# Patient Record
Sex: Female | Born: 1975 | Hispanic: No | Marital: Married | Smoking: Never smoker
Health system: Southern US, Community
[De-identification: ages and names within clinical notes are randomized; demographics above are authoritative.]

## PROBLEM LIST (undated history)

## (undated) ENCOUNTER — Inpatient Hospital Stay (HOSPITAL_COMMUNITY): Payer: Self-pay

## (undated) DIAGNOSIS — R000 Tachycardia, unspecified: Secondary | ICD-10-CM

## (undated) DIAGNOSIS — E7800 Pure hypercholesterolemia, unspecified: Secondary | ICD-10-CM

## (undated) DIAGNOSIS — N200 Calculus of kidney: Secondary | ICD-10-CM

## (undated) HISTORY — PX: TONSILLECTOMY: SUR1361

## (undated) HISTORY — PX: WISDOM TOOTH EXTRACTION: SHX21

## (undated) HISTORY — PX: CHOLECYSTECTOMY: SHX55

---

## 2006-09-20 ENCOUNTER — Inpatient Hospital Stay (HOSPITAL_COMMUNITY): Admission: AD | Admit: 2006-09-20 | Discharge: 2006-09-20 | Payer: Self-pay | Admitting: Gynecology

## 2006-09-20 ENCOUNTER — Encounter (INDEPENDENT_AMBULATORY_CARE_PROVIDER_SITE_OTHER): Payer: Self-pay | Admitting: *Deleted

## 2006-10-11 ENCOUNTER — Encounter: Admission: RE | Admit: 2006-10-11 | Discharge: 2006-10-11 | Payer: Self-pay | Admitting: Obstetrics and Gynecology

## 2007-06-06 ENCOUNTER — Encounter (INDEPENDENT_AMBULATORY_CARE_PROVIDER_SITE_OTHER): Payer: Self-pay | Admitting: Obstetrics and Gynecology

## 2007-06-06 ENCOUNTER — Ambulatory Visit (HOSPITAL_COMMUNITY): Admission: RE | Admit: 2007-06-06 | Discharge: 2007-06-06 | Payer: Self-pay | Admitting: Radiology

## 2007-12-30 IMAGING — US US OB COMP LESS 14 WK
1 series · 14 of 28 positions shown · non-contrast
Comparison: None.

CLINICAL DATA: Heavy vaginal bleeding and pelvic cramping for the past 5 days.
13 weeks and 4 days pregnant by last menstrual period. Quantitative beta-hCG of
625.

COMPLETE OBSTETRICAL ULTRASOUND LESS THAN 14 WEEKS AND TRANSVAGINAL OBSTETRICAL
ULTRASOUND

[Series 1: us ob comp less 14 wk · 0.25mm/px · 14 of 41 slices shown]
[im 2/41]
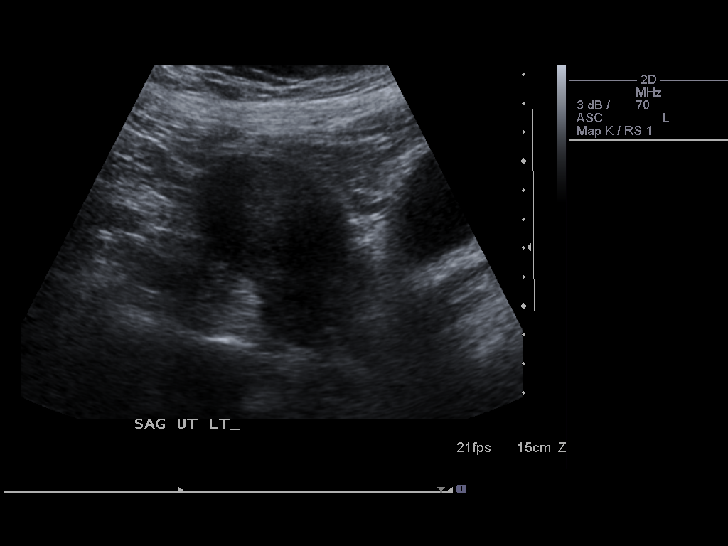
[im 5/41]
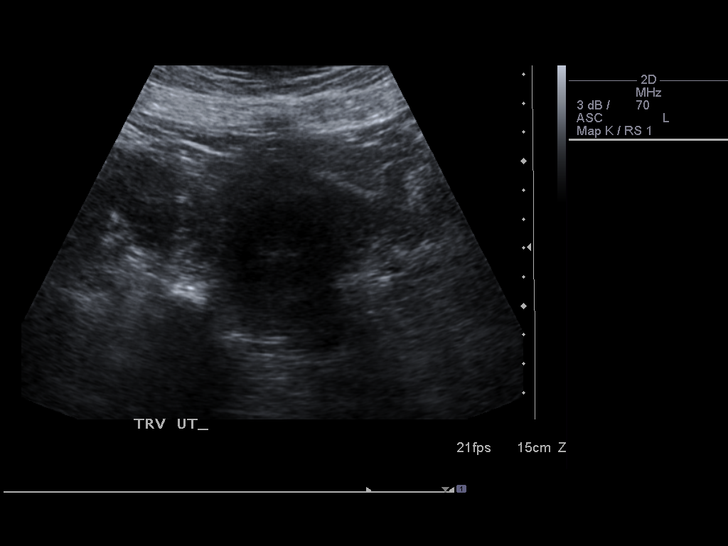
[im 8/41]
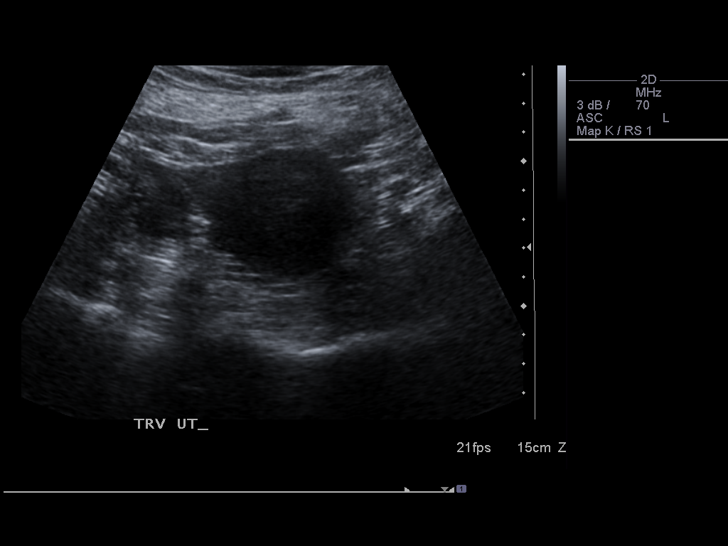
[im 11/41]
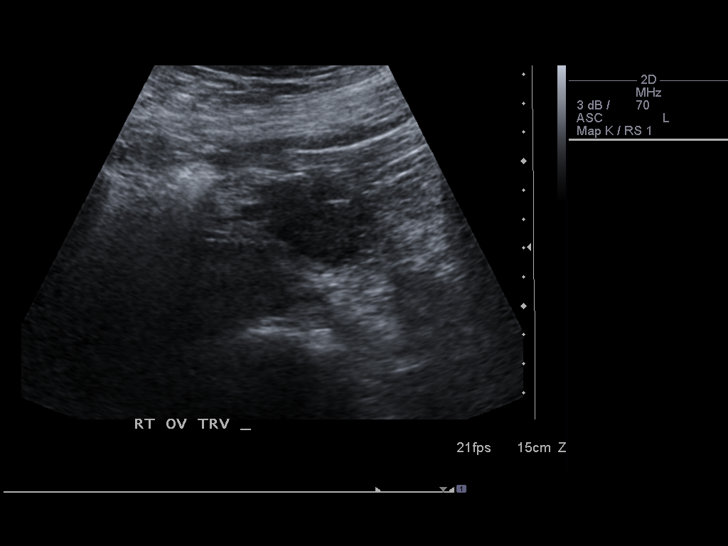
[im 14/41]
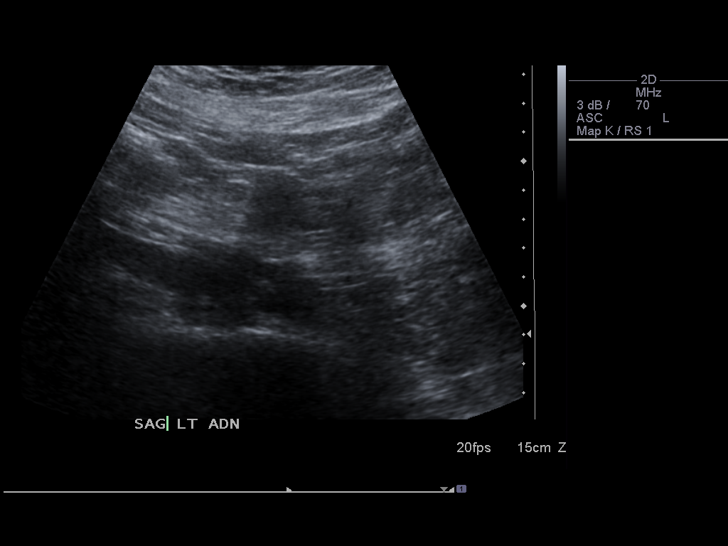
[im 17/41]
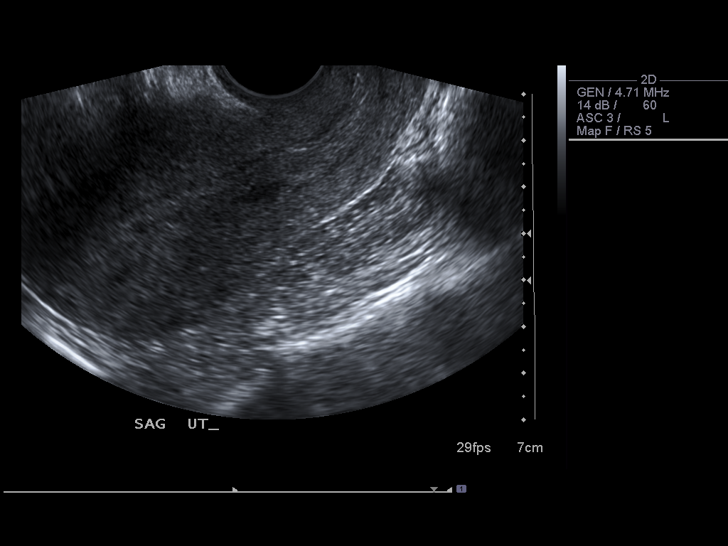
[im 20/41]
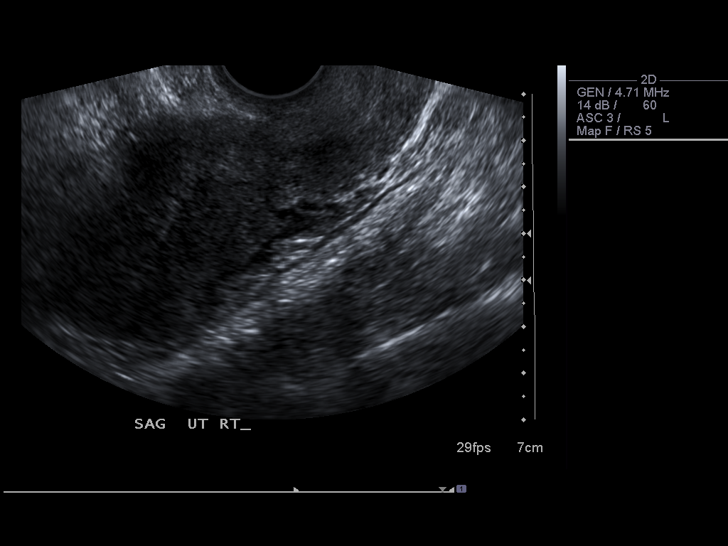
[im 23/41]
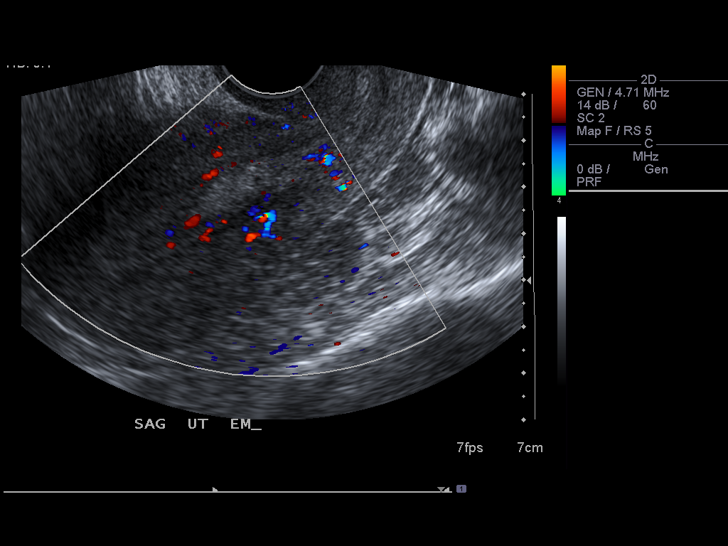
[im 26/41]
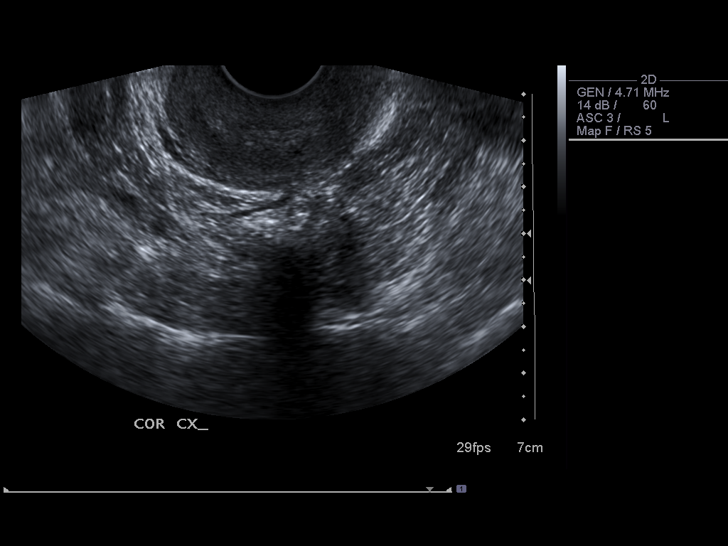
[im 29/41]
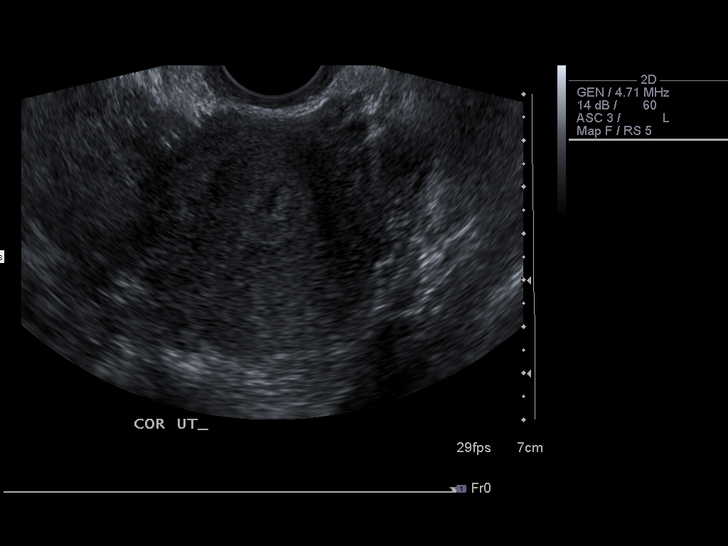
[im 32/41]
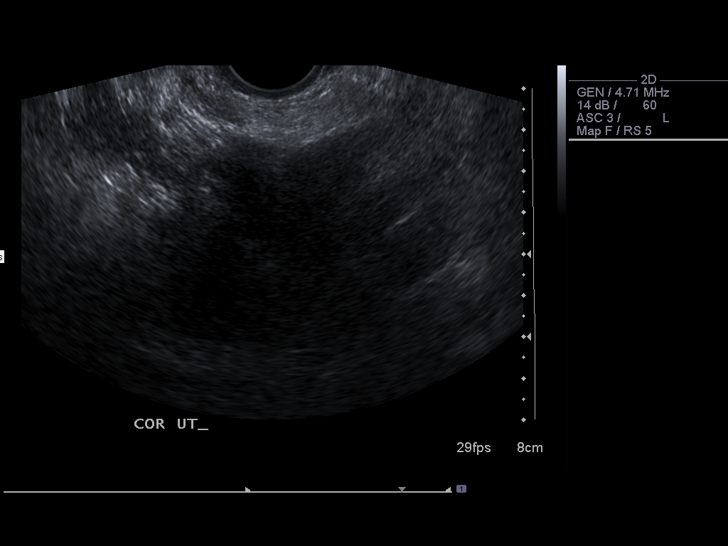
[im 35/41]
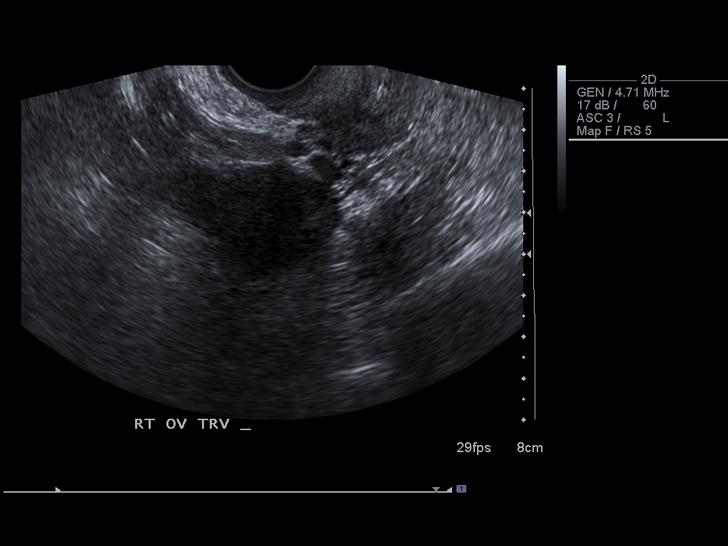
[im 38/41]
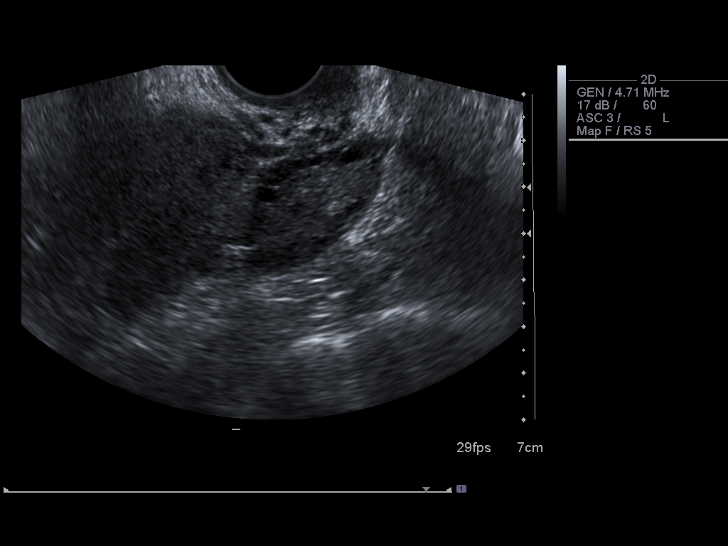
[im 41/41]
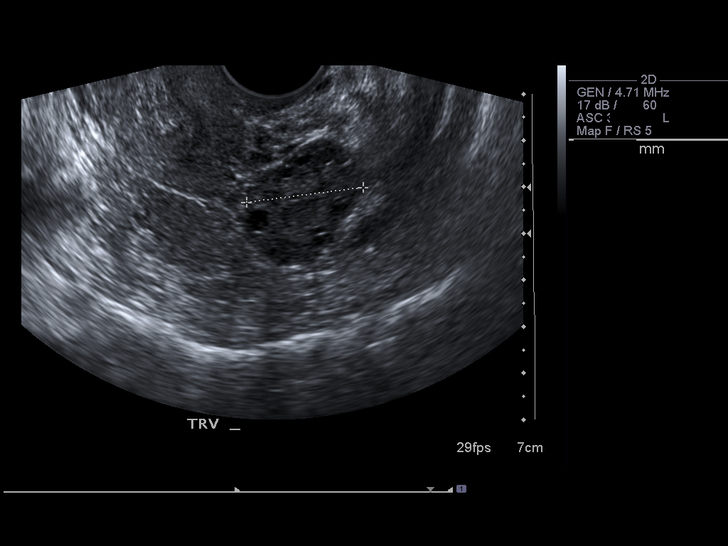

[14 of 28 positions shown; findings below may reference images not displayed]

FINDINGS: Transabdominal and transvaginal sonographic imaging of the pelvis
demonstrates a diffusely heterogeneous endometrium measuring 10.7 mm in maximum
thickness, transvaginally. There was swirling of material within the endometrium
at real time. Otherwise, normal appearing uterus. No intrauterine or
extrauterine gestational sac seen. Normal appearing ovaries. No free peritoneal
fluid.

IMPRESSION

Findings most compatible with spontaneous abortion with blood within the
endometrial cavity. The possibility of retained products of conception needs to
be excluded clinically. Serial quantitative beta-hCG determinations is
recommended to exclude a nonvisualized ectopic pregnancy.

## 2009-11-11 ENCOUNTER — Ambulatory Visit (HOSPITAL_COMMUNITY): Admission: RE | Admit: 2009-11-11 | Discharge: 2009-11-11 | Payer: Self-pay | Admitting: Obstetrics & Gynecology

## 2009-12-02 ENCOUNTER — Ambulatory Visit (HOSPITAL_COMMUNITY): Admission: RE | Admit: 2009-12-02 | Discharge: 2009-12-02 | Payer: Self-pay | Admitting: Obstetrics & Gynecology

## 2010-01-04 ENCOUNTER — Ambulatory Visit (HOSPITAL_COMMUNITY): Admission: RE | Admit: 2010-01-04 | Discharge: 2010-01-04 | Payer: Self-pay | Admitting: Obstetrics & Gynecology

## 2010-04-20 ENCOUNTER — Ambulatory Visit (HOSPITAL_COMMUNITY): Admission: RE | Admit: 2010-04-20 | Discharge: 2010-04-20 | Payer: Self-pay | Admitting: Obstetrics and Gynecology

## 2010-05-27 ENCOUNTER — Inpatient Hospital Stay (HOSPITAL_COMMUNITY): Admission: RE | Admit: 2010-05-27 | Discharge: 2010-05-29 | Payer: Self-pay | Admitting: Obstetrics and Gynecology

## 2010-12-10 ENCOUNTER — Emergency Department (HOSPITAL_COMMUNITY): Payer: BC Managed Care – PPO

## 2010-12-10 ENCOUNTER — Emergency Department (HOSPITAL_COMMUNITY)
Admission: EM | Admit: 2010-12-10 | Discharge: 2010-12-10 | Disposition: A | Discharge status: Home / Self Care | Payer: BC Managed Care – PPO | Attending: Emergency Medicine | Admitting: Emergency Medicine

## 2010-12-10 DIAGNOSIS — K8020 Calculus of gallbladder without cholecystitis without obstruction: Secondary | ICD-10-CM | POA: Insufficient documentation

## 2010-12-10 DIAGNOSIS — R1011 Right upper quadrant pain: Secondary | ICD-10-CM | POA: Insufficient documentation

## 2010-12-10 DIAGNOSIS — R110 Nausea: Secondary | ICD-10-CM | POA: Insufficient documentation

## 2010-12-10 DIAGNOSIS — R10811 Right upper quadrant abdominal tenderness: Secondary | ICD-10-CM | POA: Insufficient documentation

## 2010-12-10 LAB — COMPREHENSIVE METABOLIC PANEL
Albumin: 3.6 g/dL (ref 3.5–5.2)
BUN: 9 mg/dL (ref 6–23)
Chloride: 103 mEq/L (ref 96–112)
Creatinine, Ser: 0.67 mg/dL (ref 0.4–1.2)
GFR calc Af Amer: >60 mL/min (ref >60)
Glucose, Bld: 85 mg/dL (ref 70–99)
Potassium: 3.6 mEq/L (ref 3.5–5.1)

## 2010-12-10 LAB — LIPASE, BLOOD: Lipase: 22 U/L (ref 11–59)

## 2010-12-10 LAB — DIFFERENTIAL
Basophils Relative: 0 % (ref 0–1)
Eosinophils Absolute: 0.1 10*3/uL (ref 0.0–0.7)
Eosinophils Relative: 1 % (ref 0–5)
Lymphs Abs: 2.2 10*3/uL (ref 0.7–4.0)
Neutrophils Relative %: 60 % (ref 43–77)

## 2010-12-10 LAB — CBC
HCT: 41.7 % (ref 36.0–46.0)
Hemoglobin: 14.2 g/dL (ref 12.0–15.0)
MCHC: 34.1 g/dL (ref 30.0–36.0)
Platelets: 310 10*3/uL (ref 150–400)
WBC: 6.9 10*3/uL (ref 4.0–10.5)

## 2010-12-10 LAB — URINALYSIS, ROUTINE W REFLEX MICROSCOPIC
Nitrite: NEGATIVE
Specific Gravity, Urine: 1.03 (ref 1.005–1.030)
Urobilinogen, UA: 0.2 mg/dL (ref 0.0–1.0)

## 2010-12-10 LAB — URINE MICROSCOPIC-ADD ON

## 2010-12-10 LAB — POCT PREGNANCY, URINE: Preg Test, Ur: NEGATIVE

## 2010-12-17 ENCOUNTER — Ambulatory Visit (HOSPITAL_COMMUNITY)
Admission: RE | Admit: 2010-12-17 | Discharge: 2010-12-17 | Disposition: A | Discharge status: Home / Self Care | Payer: BC Managed Care – PPO | Source: Ambulatory Visit | Attending: General Surgery | Admitting: General Surgery

## 2010-12-17 ENCOUNTER — Other Ambulatory Visit: Payer: Self-pay | Admitting: General Surgery

## 2010-12-17 ENCOUNTER — Ambulatory Visit (HOSPITAL_COMMUNITY): Payer: BC Managed Care – PPO

## 2010-12-17 DIAGNOSIS — E669 Obesity, unspecified: Secondary | ICD-10-CM | POA: Insufficient documentation

## 2010-12-17 DIAGNOSIS — Z79899 Other long term (current) drug therapy: Secondary | ICD-10-CM | POA: Insufficient documentation

## 2010-12-17 DIAGNOSIS — K8010 Calculus of gallbladder with chronic cholecystitis without obstruction: Secondary | ICD-10-CM | POA: Insufficient documentation

## 2010-12-17 LAB — SURGICAL PCR SCREEN
MRSA, PCR: NEGATIVE
Staphylococcus aureus: NEGATIVE

## 2010-12-20 NOTE — Op Note (Signed)
NAMEEULAR, PANEK           ACCOUNT NO.:  [id number]  MEDICAL RECORD NO.:  [id number]           PATIENT TYPE:  O  LOCATION:  DAYL                         FACILITY:  [facility]  PHYSICIAN:  Ollen Gross. Vernell Morgans, M.D. DATE OF BIRTH:  1975/12/18  DATE OF PROCEDURE:  12/17/2010 DATE OF DISCHARGE:                              OPERATIVE REPORT   PREOPERATIVE DIAGNOSIS:  Gallstones.  POSTOPERATIVE DIAGNOSIS:  Gallstones.  PROCEDURE:  Laparoscopic cholecystectomy with intraoperative cholangiogram.  SURGEON:  Ollen Gross. Vernell Morgans, M.D.  ASSISTANT:  Eber Hong, P.A.  ANESTHESIA:  General endotracheal.  PROCEDURE IN DETAIL:  After informed consent was obtained, the patient was brought to the operating room and placed in the supine position on the operating room table.  After adequate induction of general anesthesia, the patient's abdomen was prepped with ChloraPrep, allowed to dry and then draped in the usual sterile manner.  The area below the umbilicus was infiltrated with 0.25% Marcaine.  A small incision was made with a 15-blade knife.  This incision was carried down through the subcutaneous tissue bluntly with a Kelly clamp and [employer] retractors until the linea alba was identified.  The linea alba was incised with a 15-blade knife.  Each side was grasped with Kocher clamps and elevated anteriorly.  The preperitoneal space was then probed bluntly with a hemostat until the peritoneum was opened and access was gained to the abdominal cavity.  A 0-Vicryl pursestring stitch was placed in the fascia around the opening and a Hasson cannula was placed through the opening and anchored in place with the previously placed 0-Vicryl pursestring stitch.  The abdomen was then insufflated with carbon dioxide without difficulty.  A laparoscope was inserted through the Hasson cannula and the right upper quadrant was inspected.  The dome of the gallbladder and liver were readily identified.   Next, the epigastric region was infiltrated with 0.25% Marcaine.  A small incision was made with a 15-blade knife and a 10-mm port was placed bluntly through this incision into the abdominal cavity under direct vision.  The sites were then chosen laterally on the right side of the abdomen for placement of 5-mm ports.  Each of these areas were infiltrated with 0.25% Marcaine. Small stab incisions were made with a 15-blade knife and 5-mm ports were placed bluntly through these incisions into the abdominal cavity under direct vision.  A blunt grasper was placed through the 5-mm port and used to grasp the dome of the gallbladder and elevate it anteriorly and superiorly.  Another blunt grasper was placed through the other 5-mm port and used to retract on the body and neck of the gallbladder.  Some omental adhesions to the body of the gallbladder were taken down sharply with the blunt grasper and cautery.  Another blunt grasper was placed through the other 5-mm port and used to retract on the body and neck of the gallbladder and using the dissector and the electrocautery, the peritoneal reflection at the gallbladder neck was opened.  Blunt dissection was then carried out in this area until the gallbladder neck and cystic duct junction was readily identified and a good window  was created.  The patient had an anterior running artery.  This was also dissected bluntly in a circumferential manner until a good window was created.  Two clips were placed proximally and distally on the artery and the artery was divided between the two.  A clip was then placed in the gallbladder neck.  A small ductotomy was made just below the clip with the laparoscopic scissors.  A 14-gauge Angiocath was placed percutaneously through the anterior abdominal wall under direct vision. A Reddick cholangiogram catheter was placed through the Angiocath and flushed.  The Reddick catheter was then placed in the cystic duct  and anchored in place with a clip.  A cholangiogram was obtained that showed no filling defects, good emptying in the duodenum and adequate length on the cystic duct.  The anchoring clip and catheters were then removed from the patient.  Three clips were placed proximal in the cystic duct and the duct was divided between the two sets of clips.  Next, the laparoscopic hook cautery device was used to separate the gallbladder from the liver bed.  Prior to completely detaching the gallbladder from the liver bed, the liver bed was inspected.  A couple of small little vessels along the liver bed were controlled with clips.  The area was completely hemostatic.  The gallbladder was then detached the rest of the way from the liver bed without difficulty with the hook cautery.  A laparoscopic bag was inserted through the epigastric port.  The gallbladder was placed in the bag and the bag was sealed.  The abdomen was then irrigated with copious amounts of saline until the effluent was clear.  The laparoscope was then moved to the epigastric port.  A gallbladder grasper was placed through the Hasson cannula and used to grasp the opening of the bag.  The bag with the gallbladder was removed with the Hasson cannula through the infraumbilical port without difficulty.  The fascial defect was closed with the previously placed Vicryl pursestring stitch as well as another interrupted 0 Vicryl stitch.  The rest of the ports were removed under direct vision and were found to be hemostatic.  The gas was allowed to escape.  The skin incisions were all closed with interrupted 4-0 Monocryl subcuticular stitches and Dermabond dressings were applied.  The patient tolerated the procedure well.  At the end of the case all needle, sponge and instrument counts were correct.  The patient was then awakened and taken to the recovery room in stable condition.     Ollen Gross. Vernell Morgans, M.D.     PST/MEDQ  D:  12/17/2010   T:  12/17/2010  Job:  [id number]  Electronically Signed by Chevis Pretty III M.D. on 12/20/2010 07:04:14 AM

## 2010-12-24 LAB — CBC
Hemoglobin: 12.3 g/dL (ref 12.0–15.0)
MCH: 31.7 pg (ref 26.0–34.0)
MCV: 93.2 fL (ref 78.0–100.0)
Platelets: 215 10*3/uL (ref 150–400)
Platelets: 274 10*3/uL (ref 150–400)
RBC: 3.34 MIL/uL — ABNORMAL LOW (ref 3.87–5.11)
RBC: 3.88 MIL/uL (ref 3.87–5.11)
RDW: 13.1 % (ref 11.5–15.5)
RDW: 13.4 % (ref 11.5–15.5)
WBC: 13.5 10*3/uL — ABNORMAL HIGH (ref 4.0–10.5)

## 2010-12-24 LAB — RH IMMUNE GLOB WKUP(>/=20WKS)(NOT WOMEN'S HOSP)

## 2011-02-20 IMAGING — US US OB NUCHAL TRANSLUCENCY 1ST GEST
1 series · 14 of 25 positions shown · non-contrast
Comparison: none

OBSTETRICAL ULTRASOUND:
 This ultrasound was performed in The [HOSPITAL], and the [facility] report will be stored to [REDACTED] PACS.  This report is also available in [HOSPITAL]?s accessANYware.

[Series 1: us ob nuchal translucency 1st gest · 14 of 25 slices shown]
[im 1/25]
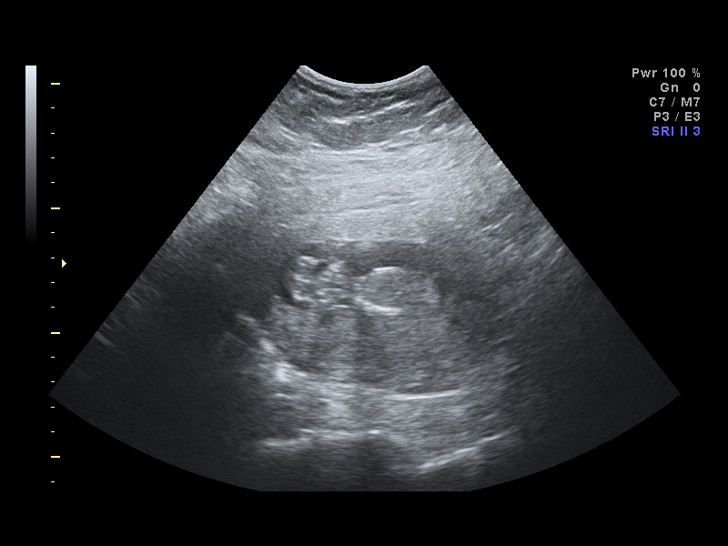
[im 3/25]
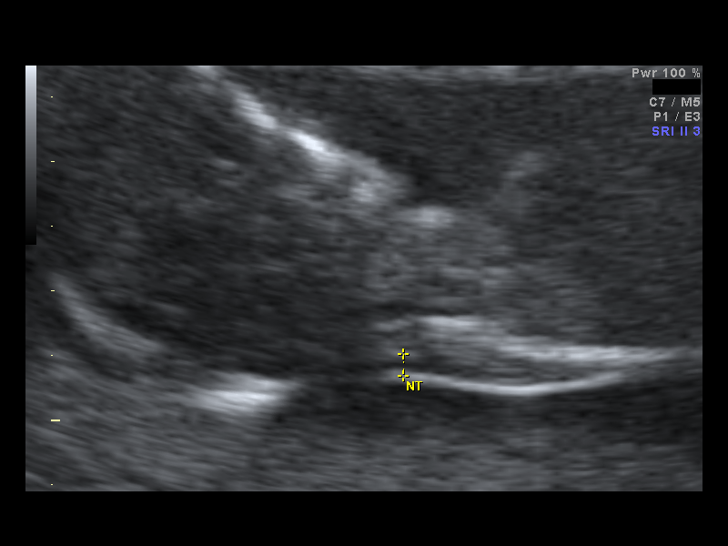
[im 5/25]
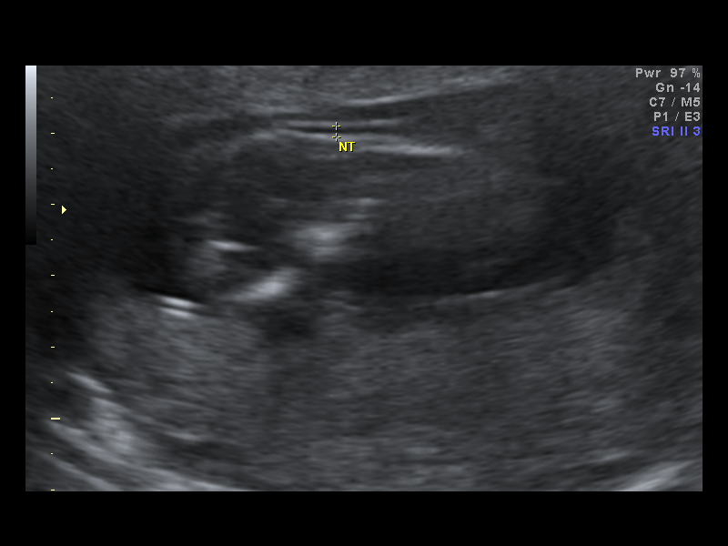
[im 7/25]
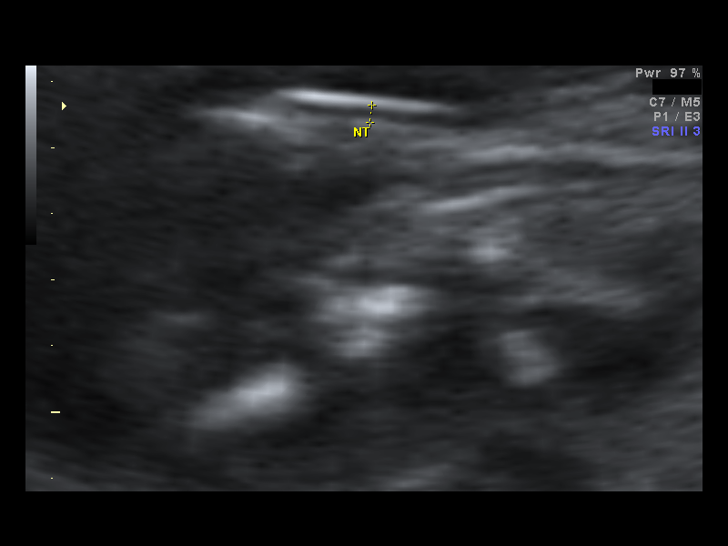
[im 9/25]
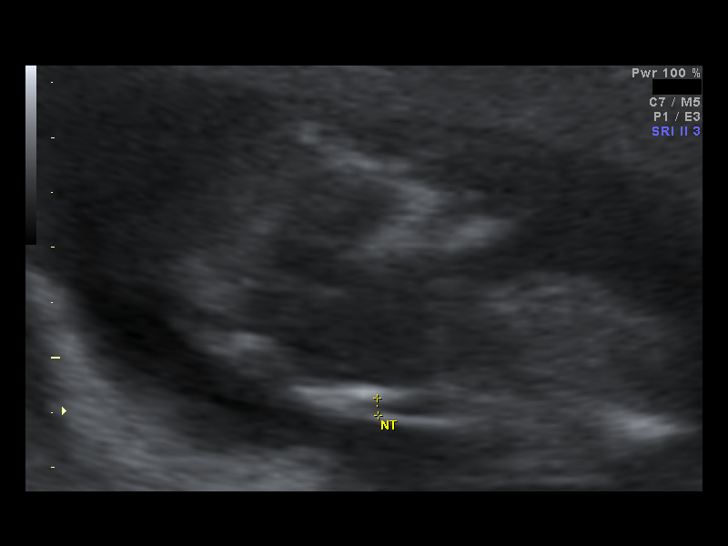
[im 10/25]
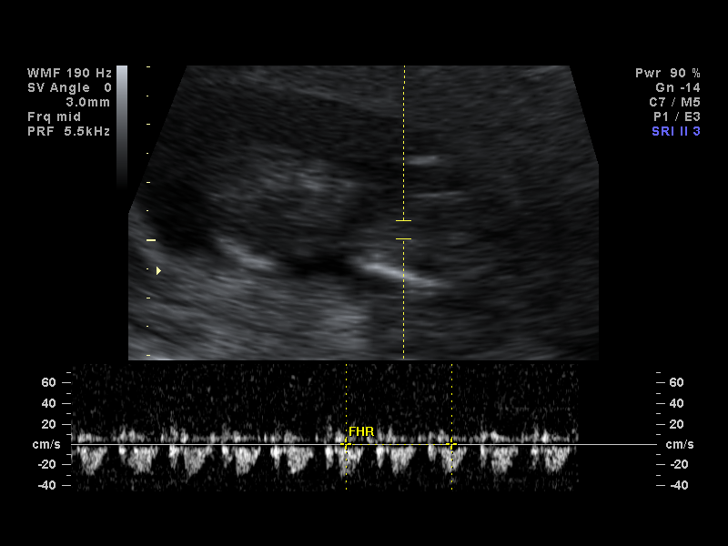
[im 12/25]
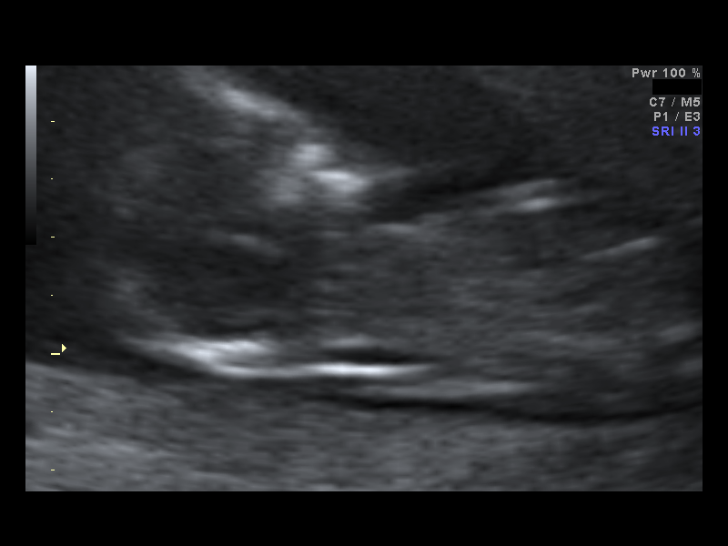
[im 14/25]
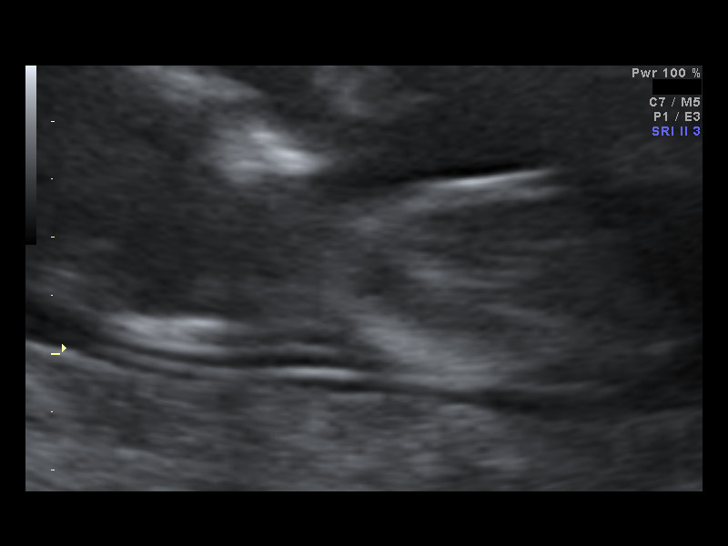
[im 16/25]
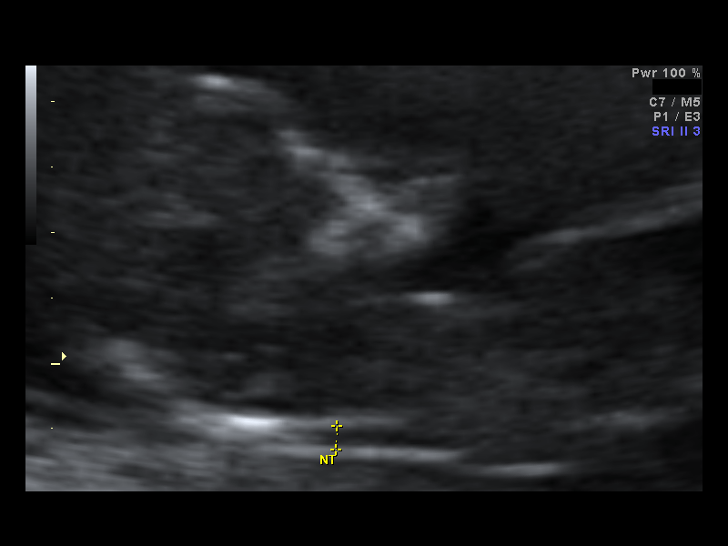
[im 17/25]
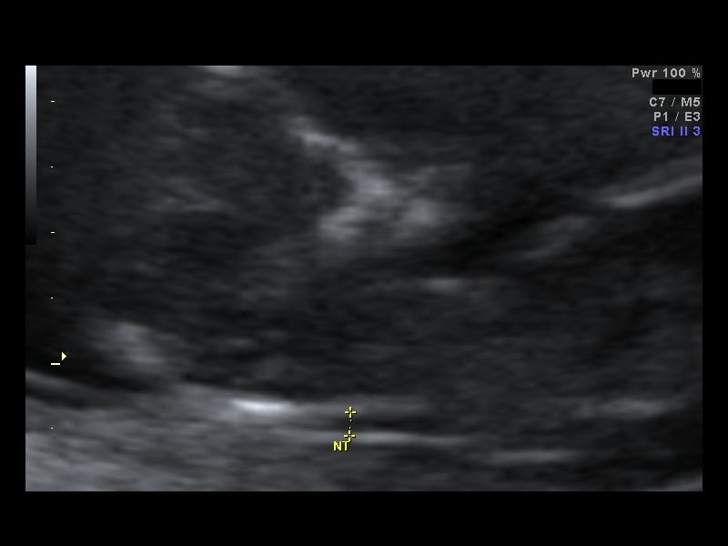
[im 19/25]
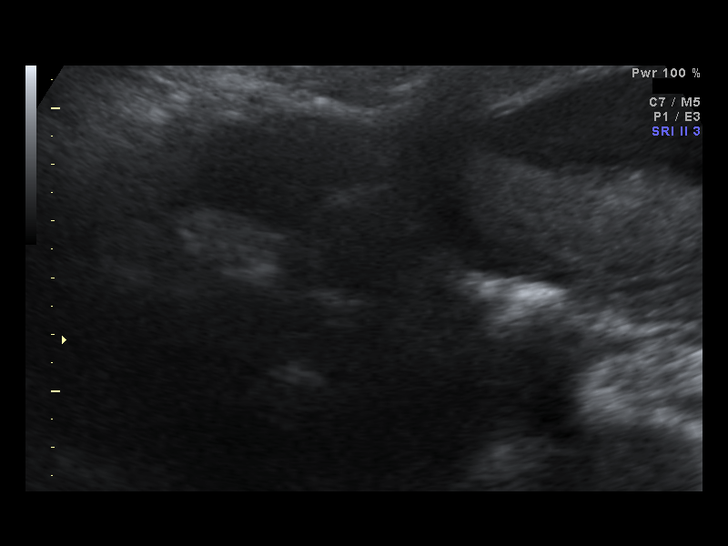
[im 21/25]
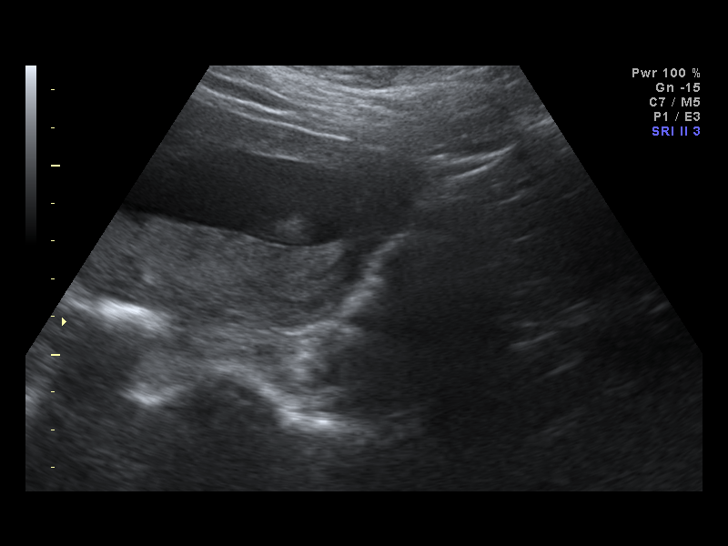
[im 23/25]
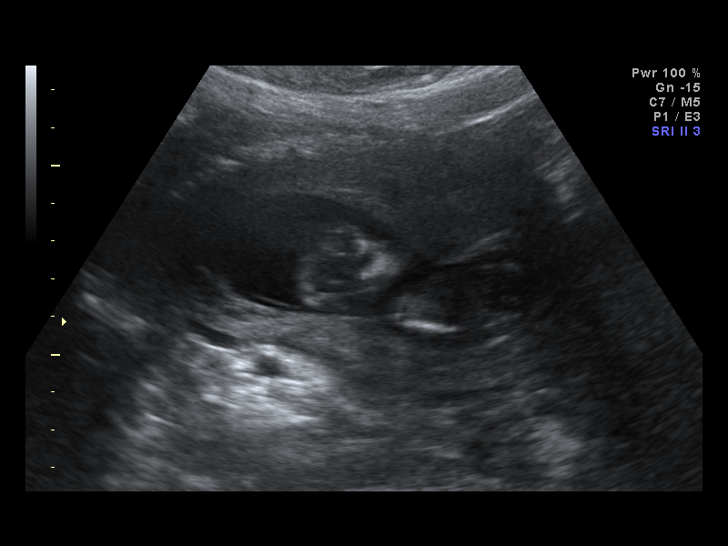
[im 25/25]
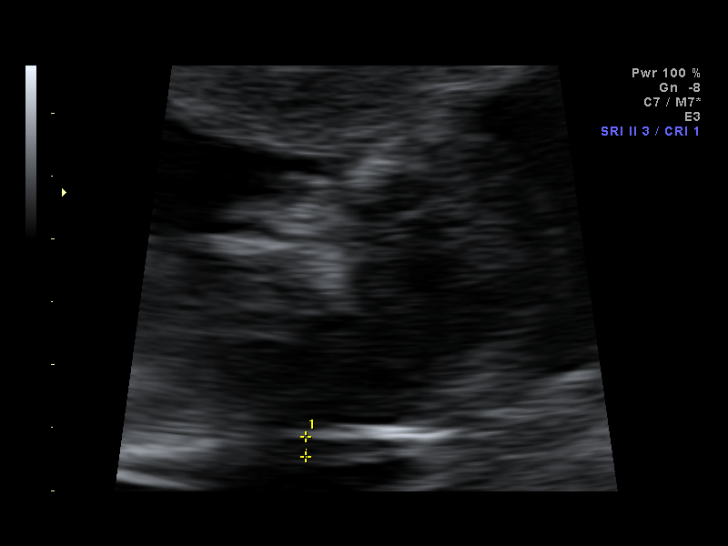

[14 of 25 positions shown; findings below may reference images not displayed]

IMPRESSION: [facility] has also been faxed to the ordering physician.

## 2011-02-22 NOTE — Op Note (Signed)
Shelley Barber, Shelley Barber           ACCOUNT NO.:  [id number]   MEDICAL RECORD NO.:  [id number]          PATIENT TYPE:  AMB   LOCATION:  SDC                           FACILITY:  [facility]   PHYSICIAN:  Carrington Clamp, M.D. DATE OF BIRTH:  09/16/1976   DATE OF PROCEDURE:  06/06/2007  DATE OF DISCHARGE:                               OPERATIVE REPORT   PREOPERATIVE DIAGNOSIS:  Missed abortion at 12 weeks.   POSTOPERATIVE DIAGNOSIS:  Missed abortion at 12 weeks.   PROCEDURE PERFORMED:  Dilation and evacuation.   SURGEON:  Carrington Clamp, M.D.   ASSISTANT:  None.   FINDINGS:  Twelve weeks size uterus, down to ten weeks with good crie  except for the left cornu, but no tissue could be removed from this area  and so the procedure was stopped.   SPECIMEN:  Uterine contents to pathology.   ESTIMATED BLOOD LOSS:  300 cc.   INTRAVENOUS FLUIDS:  1000 cc.   URINE OUTPUT:  Not measured.   COMPLICATIONS:  None.   MEDICATIONS:  Methergine and Ancef.   COUNTS:  Were correct times three.   TECHNIQUE:  After adequate general anesthesia, the patient was prepped  and draped in the usual sterile fashion, dorsal lithotomy position.  The  gauze and laminaria that had been placed yesterday were removed manually  from the vagina and the patient was then prepped.  She had a vaginal  prep as well.  The bladder was emptied with a catheter and the speculum  placed in the vagina.  A single tooth tenaculum was used to grasp the  cervix and the cervix was dilated carefully and easily with Shawnie Pons  dilators.  A 12 mm curet was then passed into the uterine cavity and  multiple passes were used to empty as much of the contents of the uterus  as possible.  Alternating sharp curettage with the suction curettage was  then able to remove most of the tissue.  The left cornu did not have  great crie; however, could not get around to see if there was a large  piece of placenta and multiple attempts were made to  remove this also  with the polyp forceps.  The polyp forceps seemed to be grasping onto  decidua and the myometrium.  Therefore, the patient was given Methergine  and the procedure stopped.  Bleeding was actually minimal at this point.  A small  amount of bleeding from the single tooth tenaculum site was cauterized  with Silver Nitrate.  Again, bleeding was noted to be minimal from the  cervix at this point.  All instruments were then withdrawn from the  vagina, and the patient returned to the recovery room in stable  condition.      Carrington Clamp, M.D.  Electronically Signed     MH/MEDQ  D:  06/06/2007  T:  06/07/2007  Job:  [id number]

## 2011-07-22 LAB — CBC
HCT: 40.9
MCV: 89.3
Platelets: 324

## 2011-07-22 LAB — RH IMMUNE GLOBULIN WORKUP (NOT WOMEN'S HOSP)
ABO/RH(D): A NEG
Antibody Screen: NEGATIVE

## 2011-07-22 LAB — ABO/RH: ABO/RH(D): A NEG

## 2012-03-27 IMAGING — RF DG CHOLANGIOGRAM OPERATIVE
1 series · 4 of 4 positions shown · non-contrast
Comparison: None.

CLINICAL DATA: Cholecystectomy.  Gallstones.

INTRAOPERATIVE CHOLANGIOGRAM
TECHNIQUE: Cholangiographic images from the C-arm fluoroscopic
device were submitted for interpretation post-operatively.  Please
see the procedural report for the amount of contrast and the
fluoroscopy time utilized.

[Series 1: run · 4 of 47 frames shown]
[frame 2/47]
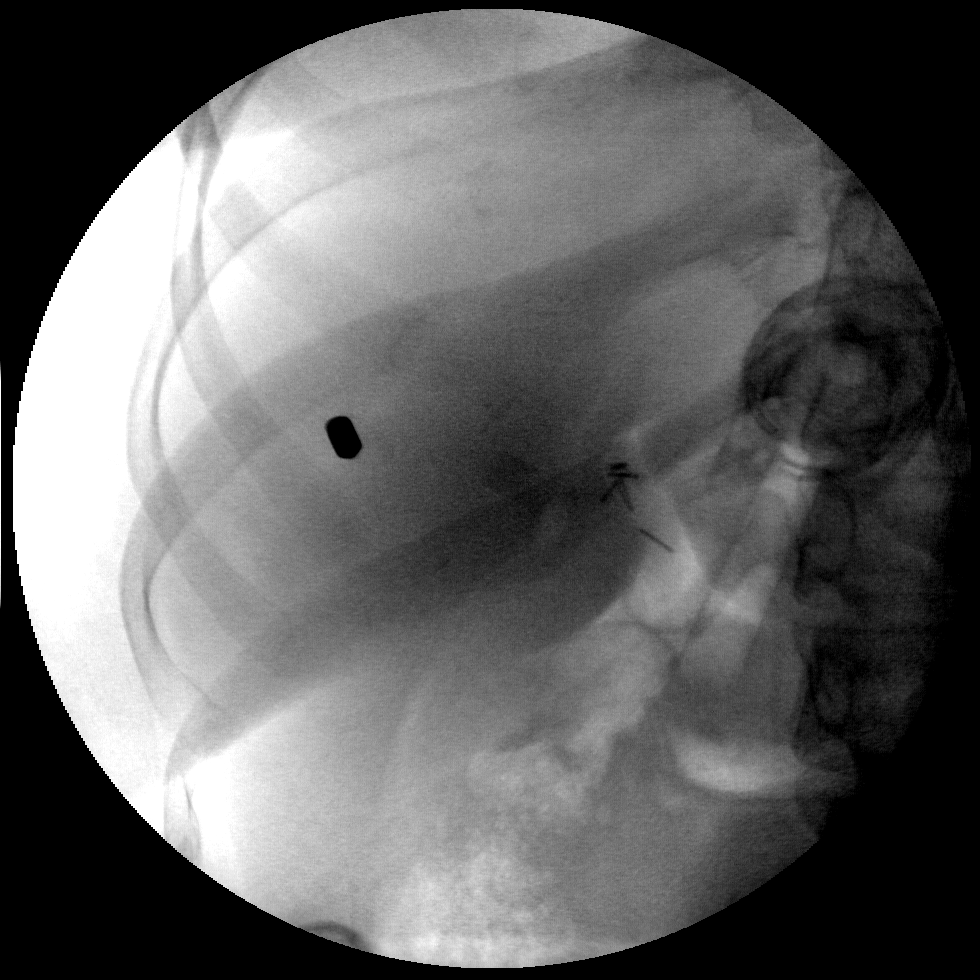
[frame 8/47]
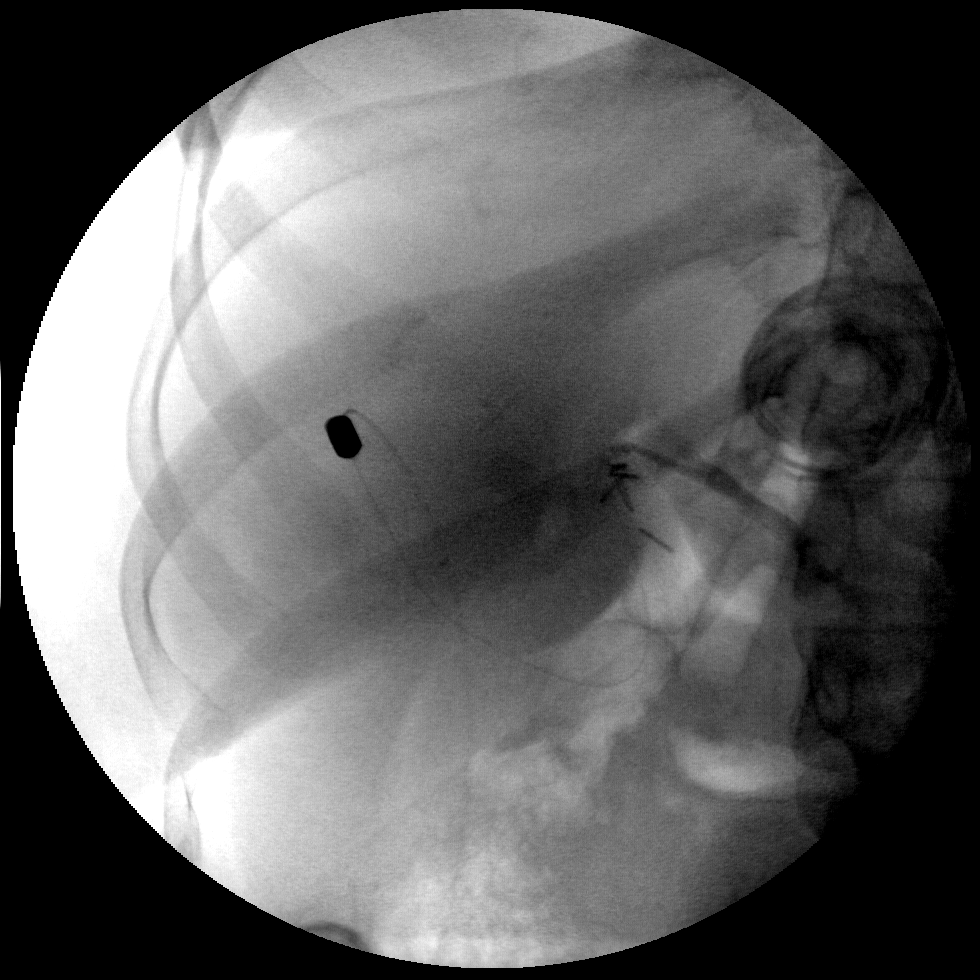
[frame 24/47]
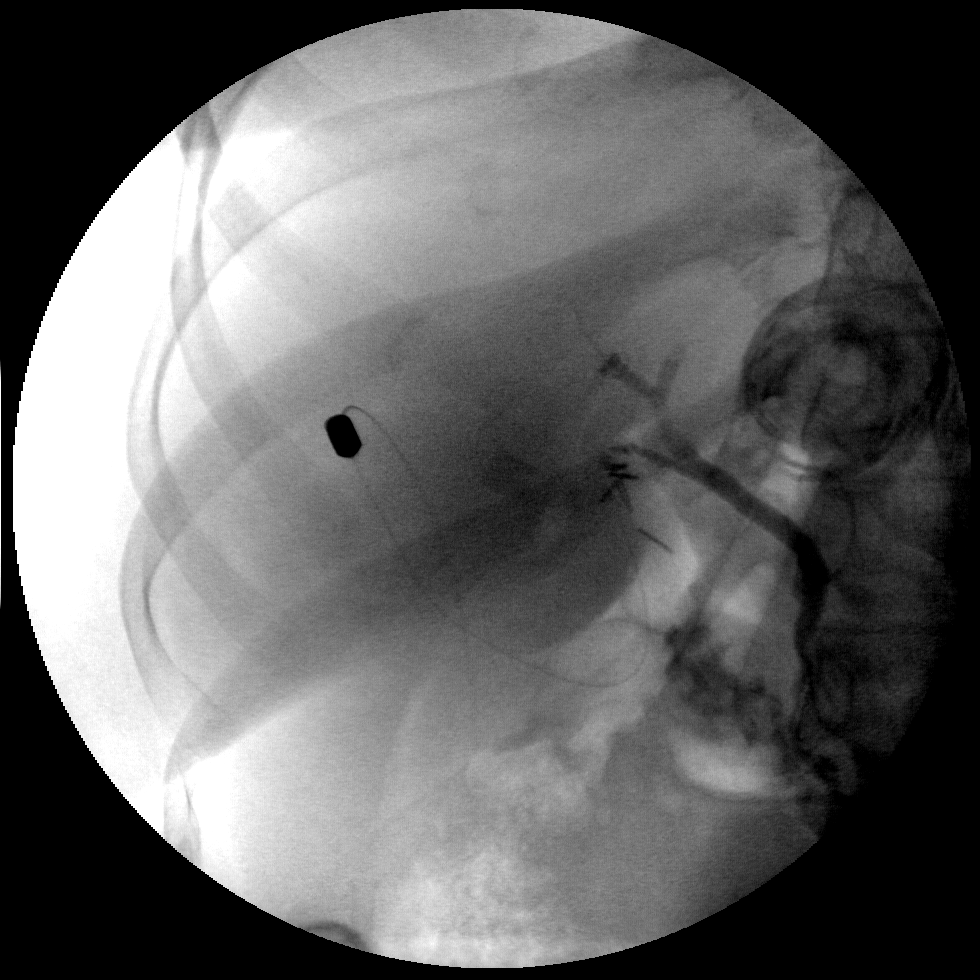
[frame 40/47]
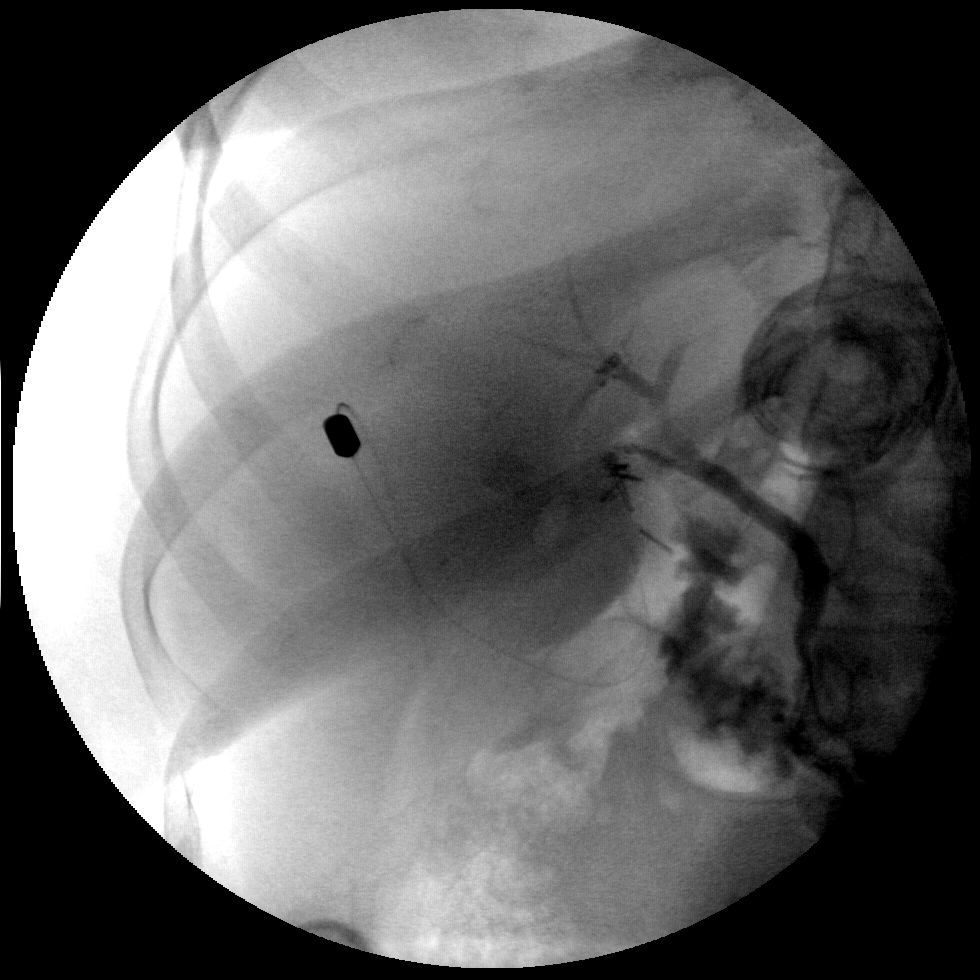

[4 of 4 positions shown; findings below may reference images not displayed]

FINDINGS: Single run of 47 images from intraoperative
cholangiogram submitted.  Contrast injection into the cystic duct
remnant without evidence of extravasation. No intrahepatic biliary
ductal dilatation.  Opacification of the intrahepatic ducts is
incomplete however.  No persistent filling defect within the common
duct to suggest retained stone.  Free spill of contrast into the
descending duodenum.  Mildly degraded secondary patient
positioning.
IMPRESSION: No evidence of common duct stone or biliary leak.

## 2013-05-14 ENCOUNTER — Other Ambulatory Visit: Payer: Self-pay | Admitting: Obstetrics and Gynecology

## 2014-04-18 ENCOUNTER — Inpatient Hospital Stay (HOSPITAL_COMMUNITY)
Admission: AD | Admit: 2014-04-18 | Discharge: 2014-04-18 | Disposition: A | Discharge status: Home / Self Care | Payer: BC Managed Care – PPO | Source: Ambulatory Visit | Attending: Obstetrics & Gynecology | Admitting: Obstetrics & Gynecology

## 2014-04-18 ENCOUNTER — Other Ambulatory Visit: Payer: Self-pay | Admitting: Obstetrics and Gynecology

## 2014-04-18 ENCOUNTER — Encounter (HOSPITAL_COMMUNITY): Payer: Self-pay | Admitting: *Deleted

## 2014-04-18 DIAGNOSIS — O219 Vomiting of pregnancy, unspecified: Secondary | ICD-10-CM

## 2014-04-18 DIAGNOSIS — O210 Mild hyperemesis gravidarum: Secondary | ICD-10-CM | POA: Insufficient documentation

## 2014-04-18 HISTORY — DX: Calculus of kidney: N20.0

## 2014-04-18 LAB — COMPREHENSIVE METABOLIC PANEL
ALBUMIN: 3.2 g/dL — AB (ref 3.5–5.2)
ALT: 18 U/L (ref 0–35)
AST: 14 U/L (ref 0–37)
Alkaline Phosphatase: 82 U/L (ref 39–117)
Anion gap: 12 (ref 5–15)
BILIRUBIN TOTAL: 0.3 mg/dL (ref 0.3–1.2)
BUN: 6 mg/dL (ref 6–23)
CHLORIDE: 98 meq/L (ref 96–112)
CO2: 24 meq/L (ref 19–32)
Calcium: 9.2 mg/dL (ref 8.4–10.5)
Creatinine, Ser: 0.58 mg/dL (ref 0.50–1.10)
GFR calc Af Amer: >90 mL/min (ref >90)
Glucose, Bld: 94 mg/dL (ref 70–99)
POTASSIUM: 3.8 meq/L (ref 3.7–5.3)
SODIUM: 134 meq/L — AB (ref 137–147)
Total Protein: 7.4 g/dL (ref 6.0–8.3)

## 2014-04-18 LAB — OB RESULTS CONSOLE RPR: RPR: NONREACTIVE

## 2014-04-18 LAB — OB RESULTS CONSOLE ANTIBODY SCREEN: Antibody Screen: NEGATIVE

## 2014-04-18 LAB — OB RESULTS CONSOLE HIV ANTIBODY (ROUTINE TESTING): HIV: NONREACTIVE

## 2014-04-18 LAB — OB RESULTS CONSOLE HEPATITIS B SURFACE ANTIGEN: Hepatitis B Surface Ag: NEGATIVE

## 2014-04-18 LAB — OB RESULTS CONSOLE RUBELLA ANTIBODY, IGM: Rubella: IMMUNE

## 2014-04-18 LAB — OB RESULTS CONSOLE GC/CHLAMYDIA
CHLAMYDIA, DNA PROBE: NEGATIVE
GC PROBE AMP, GENITAL: NEGATIVE

## 2014-04-18 LAB — OB RESULTS CONSOLE ABO/RH: RH Type: NEGATIVE

## 2014-04-18 MED ORDER — ONDANSETRON HCL 4 MG/2ML IJ SOLN
4.0000 mg | Freq: Once | INTRAMUSCULAR | Status: AC
  Administered 2014-04-18: 4 mg via INTRAVENOUS
  Filled 2014-04-18: qty 2

## 2014-04-18 MED ORDER — SODIUM CHLORIDE 0.9 % IV SOLN
INTRAVENOUS | Status: DC
  Administered 2014-04-18: 17:00:00 via INTRAVENOUS

## 2014-04-18 MED ORDER — PROMETHAZINE HCL 25 MG/ML IJ SOLN
25.0000 mg | Freq: Once | INTRAVENOUS | Status: AC
  Administered 2014-04-18: 25 mg via INTRAVENOUS
  Filled 2014-04-18: qty 1

## 2014-04-18 NOTE — Progress Notes (Signed)
IV bag almost completed. Rate decreased to 50cc/hr. No vomiting. Patient given gingerale. Instructed to take very small sips, one at a time and wait at least between sips.

## 2014-04-18 NOTE — Discharge Instructions (Signed)

## 2014-04-18 NOTE — MAU Provider Note (Signed)
History     CSN: [id number]  Arrival date and time: 04/18/14 1437   First Provider Initiated Contact with Patient 04/18/14 1502      Chief Complaint  Patient presents with  . Morning Sickness   HPI [name] is a 38 y.o. G1P0 at [redacted]w[redacted]d who presents to MAU today from the office for IV hydration for N/V in pregnancy. The patient denies vaginal bleeding, discharge or pelvic pain. She states diffuse abdominal muscle soreness. She denies fever.   OB History   Grav Para Term Preterm Abortions TAB SAB Ect Mult Living   6 3 3  2  2   3       Past Medical History  Diagnosis Date  . Kidney stone     Past Surgical History  Procedure Laterality Date  . Cholecystectomy    . Tonsillectomy    . Wisdom tooth extraction      History reviewed. No pertinent family history.  History  Substance Use Topics  . Smoking status: Never Smoker   . Smokeless tobacco: Never Used  . Alcohol Use: No    Allergies: No Known Allergies  Prescriptions prior to admission  Medication Sig Dispense Refill  . Prenatal Vit-Fe Fumarate-FA (PRENATAL MULTIVITAMIN) TABS tablet Take 1 tablet by mouth daily at 12 noon.      . promethazine (PHENERGAN) 12.5 MG tablet Take 12.5 mg by mouth every 6 (six) hours as needed for nausea or vomiting.        Review of Systems  Constitutional: Positive for malaise/fatigue. Negative for fever.  Gastrointestinal: Positive for nausea, vomiting, abdominal pain and diarrhea. Negative for constipation.  Genitourinary: Negative for dysuria, urgency and frequency.       Neg - vaginal bleeding  Neurological: Positive for weakness.   Physical Exam   Blood pressure 112/39, pulse 73, temperature 98.2 F (36.8 C), temperature source Oral, resp. rate 18, height 5\' 2"  (1.575 m), weight 312 lb (141.522 kg).  Physical Exam  Constitutional: She is oriented to person, place, and time. She appears well-developed and well-nourished. No distress.  HENT:  Head:  Normocephalic.  Cardiovascular: Normal rate.   Respiratory: Effort normal.  GI: Soft. She exhibits no distension and no mass. There is no tenderness. There is no rebound and no guarding.  Neurological: She is alert and oriented to person, place, and time.  Skin: Skin is warm and dry. No erythema.  Psychiatric: She has a normal mood and affect.   Results for orders placed during the hospital encounter of 04/18/14 (from the past 24 hour(s))  COMPREHENSIVE METABOLIC PANEL     Status: Abnormal   Collection Time    04/18/14  1:15 PM      Result Value Ref Range   Sodium 134 (*) 137 - 147 mEq/L   Potassium 3.8  3.7 - 5.3 mEq/L   Chloride 98  96 - 112 mEq/L   CO2 24  19 - 32 mEq/L   Glucose, Bld 94  70 - 99 mg/dL   BUN 6  6 - 23 mg/dL   Creatinine, Ser 4.09  0.50 - 1.10 mg/dL   Calcium 9.2  8.4 - 81.1 mg/dL   Total Protein 7.4  6.0 - 8.3 g/dL   Albumin 3.2 (*) 3.5 - 5.2 g/dL   AST 14  0 - 37 U/L   ALT 18  0 - 35 U/L   Alkaline Phosphatase 82  39 - 117 U/L   Total Bilirubin 0.3  0.3 - 1.2  mg/dL   GFR calc non Af Amer >90  >90 mL/min   GFR calc Af Amer >90  >90 mL/min   Anion gap 12  5 - 15    MAU Course  Procedures None  MDM Per Dr. Henderson CloudHorvath, CMP and IV fluids with Phenergan for nausea 1 liter IV Phenergan infusion ordered. CMP drawn Patient continues to have some nausea without vomiting in MAU 1 liter NS with 4 mg Zofran given Patient reports improvement in nausea and still no vomiting in MAU today Patient able to tolerate PO  Assessment and Plan  A: SIUP at 1619w2d Nausea and vomiting in pregnancy prior to [redacted] weeks gestation  P: Discharge home Patient advised to take Phenergan and Zofran as directed Discussed food choices, small frequent meals to avoid N/V Patient advised to increase PO hydration as tolerated Patient advised to keep scheduled follow-up with Dr. Henderson CloudHorvath for routine prenatal care Patient may return to MAU as needed or if her condition were to change or  worsen  Freddi StarrJulie N Ethier, PA-C  04/18/2014, 5:26 PM

## 2014-04-18 NOTE — MAU Note (Signed)
Sent from Dr's office for meds and fluids.  Had been on phenergan, ran out earlier in the week.

## 2014-04-18 NOTE — Progress Notes (Signed)
Tolerating small sips of gingerale. No vomiting. States she stills feels nauseated. Zofran given. Encouragement to pt.

## 2014-04-19 NOTE — MAU Provider Note (Signed)
Reviewed case with PA and I agree with above  Shelley Barber STACIA  

## 2014-04-21 LAB — CYTOLOGY - PAP

## 2014-06-06 ENCOUNTER — Other Ambulatory Visit (HOSPITAL_COMMUNITY): Payer: Self-pay | Admitting: Obstetrics and Gynecology

## 2014-06-06 DIAGNOSIS — Z369 Encounter for antenatal screening, unspecified: Secondary | ICD-10-CM

## 2014-06-20 ENCOUNTER — Ambulatory Visit (HOSPITAL_COMMUNITY): Admission: RE | Admit: 2014-06-20 | Payer: BC Managed Care – PPO | Source: Ambulatory Visit

## 2014-07-09 ENCOUNTER — Other Ambulatory Visit (HOSPITAL_COMMUNITY): Payer: Self-pay | Admitting: Obstetrics and Gynecology

## 2014-07-09 ENCOUNTER — Ambulatory Visit (HOSPITAL_COMMUNITY)
Admission: RE | Admit: 2014-07-09 | Discharge: 2014-07-09 | Disposition: A | Discharge status: Home / Self Care | Payer: BC Managed Care – PPO | Source: Ambulatory Visit | Attending: Obstetrics and Gynecology | Admitting: Obstetrics and Gynecology

## 2014-07-09 DIAGNOSIS — Z369 Encounter for antenatal screening, unspecified: Secondary | ICD-10-CM

## 2014-07-09 DIAGNOSIS — Z363 Encounter for antenatal screening for malformations: Secondary | ICD-10-CM | POA: Insufficient documentation

## 2014-07-09 DIAGNOSIS — Z1389 Encounter for screening for other disorder: Secondary | ICD-10-CM | POA: Diagnosis not present

## 2014-07-09 DIAGNOSIS — O99210 Obesity complicating pregnancy, unspecified trimester: Secondary | ICD-10-CM

## 2014-07-09 DIAGNOSIS — O09522 Supervision of elderly multigravida, second trimester: Secondary | ICD-10-CM

## 2014-07-09 DIAGNOSIS — Z36 Encounter for antenatal screening of mother: Secondary | ICD-10-CM | POA: Insufficient documentation

## 2014-07-09 DIAGNOSIS — O09529 Supervision of elderly multigravida, unspecified trimester: Secondary | ICD-10-CM | POA: Insufficient documentation

## 2014-07-09 DIAGNOSIS — E669 Obesity, unspecified: Secondary | ICD-10-CM | POA: Insufficient documentation

## 2014-08-01 ENCOUNTER — Other Ambulatory Visit (HOSPITAL_COMMUNITY): Payer: Self-pay | Admitting: Obstetrics and Gynecology

## 2014-08-01 DIAGNOSIS — O359XX9 Maternal care for (suspected) fetal abnormality and damage, unspecified, other fetus: Secondary | ICD-10-CM

## 2014-08-05 ENCOUNTER — Ambulatory Visit (HOSPITAL_COMMUNITY)
Admission: RE | Admit: 2014-08-05 | Discharge: 2014-08-05 | Disposition: A | Discharge status: Home / Self Care | Payer: BC Managed Care – PPO | Source: Ambulatory Visit | Attending: Obstetrics and Gynecology | Admitting: Obstetrics and Gynecology

## 2014-08-05 DIAGNOSIS — Z36 Encounter for antenatal screening of mother: Secondary | ICD-10-CM | POA: Diagnosis not present

## 2014-08-05 DIAGNOSIS — Z3A24 24 weeks gestation of pregnancy: Secondary | ICD-10-CM | POA: Diagnosis not present

## 2014-08-05 DIAGNOSIS — O358XX0 Maternal care for other (suspected) fetal abnormality and damage, not applicable or unspecified: Secondary | ICD-10-CM | POA: Diagnosis present

## 2014-08-05 DIAGNOSIS — Z3689 Encounter for other specified antenatal screening: Secondary | ICD-10-CM

## 2014-08-05 DIAGNOSIS — O359XX9 Maternal care for (suspected) fetal abnormality and damage, unspecified, other fetus: Secondary | ICD-10-CM

## 2014-08-11 ENCOUNTER — Encounter (HOSPITAL_COMMUNITY): Payer: Self-pay | Admitting: *Deleted

## 2014-10-10 NOTE — L&D Delivery Note (Signed)
Patient was C/C/+1 and pushed for 1 minute with epidural.   NSVD  female infant, Apgars 9,9, weight P.   The patient had no lacerations. Fundus was firm. EBL was expected amount. Placenta was delivered intact. Vagina was clear.  Baby was vigorous and doing skin to skin with mother.  Jolon Degante A

## 2014-10-21 ENCOUNTER — Other Ambulatory Visit: Payer: Self-pay

## 2014-10-21 LAB — OB RESULTS CONSOLE GBS: GBS: POSITIVE

## 2014-11-25 ENCOUNTER — Encounter (HOSPITAL_COMMUNITY): Payer: Self-pay | Admitting: General Practice

## 2014-11-25 ENCOUNTER — Inpatient Hospital Stay (HOSPITAL_COMMUNITY)
Admission: AD | Admit: 2014-11-25 | Discharge: 2014-11-28 | DRG: 775 | Disposition: A | Discharge status: Home / Self Care | Payer: BLUE CROSS/BLUE SHIELD | Source: Ambulatory Visit | Attending: Obstetrics and Gynecology | Admitting: Obstetrics and Gynecology

## 2014-11-25 DIAGNOSIS — O99824 Streptococcus B carrier state complicating childbirth: Secondary | ICD-10-CM | POA: Diagnosis present

## 2014-11-25 DIAGNOSIS — Z3490 Encounter for supervision of normal pregnancy, unspecified, unspecified trimester: Secondary | ICD-10-CM

## 2014-11-25 DIAGNOSIS — Z3A41 41 weeks gestation of pregnancy: Secondary | ICD-10-CM | POA: Diagnosis present

## 2014-11-25 DIAGNOSIS — Z6841 Body Mass Index (BMI) 40.0 and over, adult: Secondary | ICD-10-CM

## 2014-11-25 DIAGNOSIS — O480 Post-term pregnancy: Principal | ICD-10-CM | POA: Diagnosis present

## 2014-11-25 DIAGNOSIS — Z87442 Personal history of urinary calculi: Secondary | ICD-10-CM | POA: Diagnosis not present

## 2014-11-25 DIAGNOSIS — O99214 Obesity complicating childbirth: Secondary | ICD-10-CM | POA: Diagnosis present

## 2014-11-25 LAB — CBC
HCT: 36.4 % (ref 36.0–46.0)
Hemoglobin: 12.6 g/dL (ref 12.0–15.0)
MCH: 31 pg (ref 26.0–34.0)
MCHC: 34.6 g/dL (ref 30.0–36.0)
MCV: 89.7 fL (ref 78.0–100.0)
PLATELETS: 254 10*3/uL (ref 150–400)
RBC: 4.06 MIL/uL (ref 3.87–5.11)
RDW: 13.2 % (ref 11.5–15.5)
WBC: 11 10*3/uL — AB (ref 4.0–10.5)

## 2014-11-25 MED ORDER — LACTATED RINGERS IV SOLN
500.0000 mL | INTRAVENOUS | Status: DC | PRN
  Administered 2014-11-26 (×2): 500 mL via INTRAVENOUS

## 2014-11-25 MED ORDER — LACTATED RINGERS IV SOLN
INTRAVENOUS | Status: DC
  Administered 2014-11-25 – 2014-11-26 (×3): via INTRAVENOUS

## 2014-11-25 MED ORDER — MISOPROSTOL 25 MCG QUARTER TABLET
25.0000 ug | ORAL_TABLET | ORAL | Status: DC | PRN
  Administered 2014-11-25 – 2014-11-26 (×2): 25 ug via VAGINAL
  Filled 2014-11-25: qty 0.25
  Filled 2014-11-25: qty 1
  Filled 2014-11-25 (×2): qty 0.25

## 2014-11-25 MED ORDER — PENICILLIN G POTASSIUM 5000000 UNITS IJ SOLR
2.5000 10*6.[IU] | INTRAVENOUS | Status: DC
  Administered 2014-11-26: 2.5 10*6.[IU] via INTRAVENOUS
  Filled 2014-11-25 (×5): qty 2.5

## 2014-11-25 MED ORDER — TERBUTALINE SULFATE 1 MG/ML IJ SOLN: 0.2500 mg | Freq: Once | INTRAMUSCULAR | Status: AC | PRN

## 2014-11-25 MED ORDER — OXYCODONE-ACETAMINOPHEN 5-325 MG PO TABS: 1.0000 | ORAL_TABLET | ORAL | Status: DC | PRN

## 2014-11-25 MED ORDER — LIDOCAINE HCL (PF) 1 % IJ SOLN
30.0000 mL | INTRAMUSCULAR | Status: DC | PRN
  Filled 2014-11-25: qty 30

## 2014-11-25 MED ORDER — ACETAMINOPHEN 325 MG PO TABS: 650.0000 mg | ORAL_TABLET | ORAL | Status: DC | PRN

## 2014-11-25 MED ORDER — OXYTOCIN 40 UNITS IN LACTATED RINGERS INFUSION - SIMPLE MED
62.5000 mL/h | INTRAVENOUS | Status: DC
  Filled 2014-11-25: qty 1000

## 2014-11-25 MED ORDER — CITRIC ACID-SODIUM CITRATE 334-500 MG/5ML PO SOLN
30.0000 mL | ORAL | Status: DC | PRN
  Filled 2014-11-25: qty 15

## 2014-11-25 MED ORDER — OXYTOCIN BOLUS FROM INFUSION: 500.0000 mL | INTRAVENOUS | Status: DC

## 2014-11-25 MED ORDER — FLEET ENEMA 7-19 GM/118ML RE ENEM: 1.0000 | ENEMA | RECTAL | Status: DC | PRN

## 2014-11-25 MED ORDER — OXYCODONE-ACETAMINOPHEN 5-325 MG PO TABS: 2.0000 | ORAL_TABLET | ORAL | Status: DC | PRN

## 2014-11-25 MED ORDER — ONDANSETRON HCL 4 MG/2ML IJ SOLN: 4.0000 mg | Freq: Four times a day (QID) | INTRAMUSCULAR | Status: DC | PRN

## 2014-11-25 MED ORDER — BUTORPHANOL TARTRATE 1 MG/ML IJ SOLN: 1.0000 mg | INTRAMUSCULAR | Status: DC | PRN

## 2014-11-25 MED ORDER — DEXTROSE 5 % IV SOLN
5.0000 10*6.[IU] | Freq: Once | INTRAVENOUS | Status: AC
  Administered 2014-11-26: 5 10*6.[IU] via INTRAVENOUS
  Filled 2014-11-25: qty 5

## 2014-11-25 MED ORDER — ZOLPIDEM TARTRATE 5 MG PO TABS
5.0000 mg | ORAL_TABLET | Freq: Every evening | ORAL | Status: DC | PRN
Start: 2014-11-25 — End: 2014-11-26

## 2014-11-26 ENCOUNTER — Inpatient Hospital Stay (HOSPITAL_COMMUNITY): Payer: BLUE CROSS/BLUE SHIELD | Admitting: Anesthesiology

## 2014-11-26 ENCOUNTER — Inpatient Hospital Stay (HOSPITAL_COMMUNITY): Admission: RE | Admit: 2014-11-26 | Payer: Self-pay | Source: Ambulatory Visit

## 2014-11-26 ENCOUNTER — Encounter (HOSPITAL_COMMUNITY): Payer: Self-pay | Admitting: *Deleted

## 2014-11-26 MED ORDER — LACTATED RINGERS IV SOLN: 500.0000 mL | Freq: Once | INTRAVENOUS | Status: DC

## 2014-11-26 MED ORDER — LIDOCAINE HCL (PF) 1 % IJ SOLN
INTRAMUSCULAR | Status: DC | PRN
  Administered 2014-11-26 (×2): 4 mL

## 2014-11-26 MED ORDER — SODIUM CHLORIDE 0.9 % IV SOLN: 250.0000 mL | INTRAVENOUS | Status: DC | PRN

## 2014-11-26 MED ORDER — ZOLPIDEM TARTRATE 5 MG PO TABS: 5.0000 mg | ORAL_TABLET | Freq: Every evening | ORAL | Status: DC | PRN

## 2014-11-26 MED ORDER — ONDANSETRON HCL 4 MG PO TABS: 4.0000 mg | ORAL_TABLET | ORAL | Status: DC | PRN

## 2014-11-26 MED ORDER — PHENYLEPHRINE 40 MCG/ML (10ML) SYRINGE FOR IV PUSH (FOR BLOOD PRESSURE SUPPORT)
80.0000 ug | PREFILLED_SYRINGE | INTRAVENOUS | Status: DC | PRN
  Administered 2014-11-26: 80 ug via INTRAVENOUS
  Filled 2014-11-26: qty 2

## 2014-11-26 MED ORDER — OXYTOCIN 40 UNITS IN LACTATED RINGERS INFUSION - SIMPLE MED
1.0000 m[IU]/min | INTRAVENOUS | Status: DC
  Administered 2014-11-26: 2 m[IU]/min via INTRAVENOUS
  Administered 2014-11-26: 666 m[IU]/min via INTRAVENOUS

## 2014-11-26 MED ORDER — DIPHENHYDRAMINE HCL 25 MG PO CAPS: 25.0000 mg | ORAL_CAPSULE | Freq: Four times a day (QID) | ORAL | Status: DC | PRN

## 2014-11-26 MED ORDER — DIBUCAINE 1 % RE OINT: 1.0000 "application " | TOPICAL_OINTMENT | RECTAL | Status: DC | PRN

## 2014-11-26 MED ORDER — FERROUS SULFATE 325 (65 FE) MG PO TABS
325.0000 mg | ORAL_TABLET | Freq: Two times a day (BID) | ORAL | Status: DC
  Administered 2014-11-27 (×2): 325 mg via ORAL
  Filled 2014-11-26 (×2): qty 1

## 2014-11-26 MED ORDER — MEASLES, MUMPS & RUBELLA VAC ~~LOC~~ INJ
0.5000 mL | INJECTION | Freq: Once | SUBCUTANEOUS | Status: DC
  Filled 2014-11-26: qty 0.5

## 2014-11-26 MED ORDER — OXYCODONE-ACETAMINOPHEN 5-325 MG PO TABS: 2.0000 | ORAL_TABLET | ORAL | Status: DC | PRN

## 2014-11-26 MED ORDER — MAGNESIUM HYDROXIDE 400 MG/5ML PO SUSP: 30.0000 mL | ORAL | Status: DC | PRN

## 2014-11-26 MED ORDER — SIMETHICONE 80 MG PO CHEW: 80.0000 mg | CHEWABLE_TABLET | ORAL | Status: DC | PRN

## 2014-11-26 MED ORDER — FENTANYL 2.5 MCG/ML BUPIVACAINE 1/10 % EPIDURAL INFUSION (WH - ANES)
14.0000 mL/h | INTRAMUSCULAR | Status: DC | PRN
  Administered 2014-11-26: 14 mL/h via EPIDURAL
  Filled 2014-11-26: qty 125

## 2014-11-26 MED ORDER — METHYLERGONOVINE MALEATE 0.2 MG/ML IJ SOLN: 0.2000 mg | INTRAMUSCULAR | Status: DC | PRN

## 2014-11-26 MED ORDER — DIPHENHYDRAMINE HCL 50 MG/ML IJ SOLN: 12.5000 mg | INTRAMUSCULAR | Status: DC | PRN

## 2014-11-26 MED ORDER — SODIUM CHLORIDE 0.9 % IJ SOLN: 3.0000 mL | INTRAMUSCULAR | Status: DC | PRN

## 2014-11-26 MED ORDER — PHENYLEPHRINE 40 MCG/ML (10ML) SYRINGE FOR IV PUSH (FOR BLOOD PRESSURE SUPPORT)
80.0000 ug | PREFILLED_SYRINGE | INTRAVENOUS | Status: DC | PRN
  Filled 2014-11-26: qty 20
  Filled 2014-11-26: qty 2

## 2014-11-26 MED ORDER — LACTATED RINGERS IV SOLN
INTRAVENOUS | Status: DC
  Administered 2014-11-26: 12:00:00 via INTRAUTERINE

## 2014-11-26 MED ORDER — TETANUS-DIPHTH-ACELL PERTUSSIS 5-2.5-18.5 LF-MCG/0.5 IM SUSP: 0.5000 mL | Freq: Once | INTRAMUSCULAR | Status: DC

## 2014-11-26 MED ORDER — METHYLERGONOVINE MALEATE 0.2 MG PO TABS: 0.2000 mg | ORAL_TABLET | ORAL | Status: DC | PRN

## 2014-11-26 MED ORDER — SODIUM CHLORIDE 0.9 % IJ SOLN: 3.0000 mL | Freq: Two times a day (BID) | INTRAMUSCULAR | Status: DC

## 2014-11-26 MED ORDER — IBUPROFEN 800 MG PO TABS
800.0000 mg | ORAL_TABLET | Freq: Three times a day (TID) | ORAL | Status: DC
  Administered 2014-11-26 – 2014-11-28 (×4): 800 mg via ORAL
  Filled 2014-11-26 (×4): qty 1

## 2014-11-26 MED ORDER — TERBUTALINE SULFATE 1 MG/ML IJ SOLN
0.2500 mg | Freq: Once | INTRAMUSCULAR | Status: AC | PRN
  Administered 2014-11-26: 0.25 mg via SUBCUTANEOUS
  Filled 2014-11-26: qty 1

## 2014-11-26 MED ORDER — EPHEDRINE 5 MG/ML INJ
10.0000 mg | INTRAVENOUS | Status: DC | PRN
Start: 2014-11-26 — End: 2014-11-26
  Filled 2014-11-26: qty 2

## 2014-11-26 MED ORDER — OXYCODONE-ACETAMINOPHEN 5-325 MG PO TABS
1.0000 | ORAL_TABLET | ORAL | Status: DC | PRN
  Administered 2014-11-27 (×4): 1 via ORAL
  Filled 2014-11-26 (×4): qty 1

## 2014-11-26 MED ORDER — LANOLIN HYDROUS EX OINT: TOPICAL_OINTMENT | CUTANEOUS | Status: DC | PRN

## 2014-11-26 MED ORDER — BENZOCAINE-MENTHOL 20-0.5 % EX AERO
1.0000 "application " | INHALATION_SPRAY | CUTANEOUS | Status: DC | PRN
  Administered 2014-11-26: 1 via TOPICAL
  Filled 2014-11-26 (×2): qty 56

## 2014-11-26 MED ORDER — ONDANSETRON HCL 4 MG PO TABS: 4.0000 mg | ORAL_TABLET | Freq: Three times a day (TID) | ORAL | Status: DC | PRN

## 2014-11-26 MED ORDER — SENNOSIDES-DOCUSATE SODIUM 8.6-50 MG PO TABS
2.0000 | ORAL_TABLET | ORAL | Status: DC
  Administered 2014-11-26 – 2014-11-27 (×2): 2 via ORAL
  Filled 2014-11-26 (×2): qty 2

## 2014-11-26 MED ORDER — ONDANSETRON HCL 4 MG/2ML IJ SOLN: 4.0000 mg | INTRAMUSCULAR | Status: DC | PRN

## 2014-11-26 MED ORDER — PANTOPRAZOLE SODIUM 40 MG PO TBEC
40.0000 mg | DELAYED_RELEASE_TABLET | Freq: Every day | ORAL | Status: DC
  Filled 2014-11-26: qty 1

## 2014-11-26 MED ORDER — FENTANYL 2.5 MCG/ML BUPIVACAINE 1/10 % EPIDURAL INFUSION (WH - ANES)
INTRAMUSCULAR | Status: DC | PRN
  Administered 2014-11-26: 14 mL/h via EPIDURAL

## 2014-11-26 MED ORDER — PRENATAL MULTIVITAMIN CH
1.0000 | ORAL_TABLET | Freq: Every day | ORAL | Status: DC
  Filled 2014-11-26: qty 1

## 2014-11-26 MED ORDER — EPHEDRINE 5 MG/ML INJ
10.0000 mg | INTRAVENOUS | Status: DC | PRN
  Filled 2014-11-26: qty 2

## 2014-11-26 MED ORDER — PROMETHAZINE HCL 25 MG PO TABS: 12.5000 mg | ORAL_TABLET | Freq: Four times a day (QID) | ORAL | Status: DC | PRN

## 2014-11-26 MED ORDER — WITCH HAZEL-GLYCERIN EX PADS: 1.0000 "application " | MEDICATED_PAD | CUTANEOUS | Status: DC | PRN

## 2014-11-26 NOTE — Anesthesia Procedure Notes (Signed)
Epidural Patient location during procedure: OB Start time: 11/26/2014 8:48 AM  Staffing Anesthesiologist: Felipe DroneJUDD, Zyliah Schier JENNETTE Performed by: anesthesiologist   Preanesthetic Checklist Completed: patient identified, site marked, surgical consent, pre-op evaluation, timeout performed, IV checked, risks and benefits discussed and monitors and equipment checked  Epidural Patient position: sitting Prep: site prepped and draped and DuraPrep Patient monitoring: continuous pulse ox and blood pressure Approach: midline Location: L3-L4 Injection technique: LOR saline  Needle:  Needle type: Tuohy  Needle gauge: 17 G Needle length: 9 cm and 9 Needle insertion depth: 9 cm Catheter type: closed end flexible Catheter size: 19 Gauge Catheter at skin depth: 15 cm Test dose: negative  Assessment Events: blood not aspirated, injection not painful, no injection resistance, negative IV test and no paresthesia  Additional Notes Patient identified. Risks/Benefits/Options discussed with patient including but not limited to bleeding, infection, nerve damage, paralysis, failed block, incomplete pain control, headache, blood pressure changes, nausea, vomiting, reactions to medication both or allergic, itching and postpartum back pain. Confirmed with bedside nurse the patient's most recent platelet count. Confirmed with patient that they are not currently taking any anticoagulation, have any bleeding history or any family history of bleeding disorders. Patient expressed understanding and wished to proceed. All questions were answered. Sterile technique was used throughout the entire procedure. Please see nursing notes for vital signs. Test dose was given through epidural catheter and negative prior to continuing to dose epidural or start infusion. Warning signs of high block given to the patient including shortness of breath, tingling/numbness in hands, complete motor block, or any concerning symptoms with  instructions to call for help. Patient was given instructions on fall risk and not to get out of bed. All questions and concerns addressed with instructions to call with any issues or inadequate analgesia.

## 2014-11-26 NOTE — Lactation Note (Signed)
This note was copied from the chart of Shelley Claybon JabsBrigitte Limbach. Lactation Consultation Note Initial visit at 7 hours of age.  Mom reports good feedings and just finished a 30 minute feeding.  Baby is finishing bath and continues to show feeding cues.   When placed to breast in football hold on right breast baby latched with minimal assist. Baby has wide flanged lips with rhythmic sucking and good strong jaw excursions.  Mom reports [facility] and seeing colostrum.  [facility] resources given and discussed.  Encouraged to feed with early cues on demand.  Early newborn behavior discussed.  Hand expression demonstrated with colostrum visible.  Mom to call for assist as needed.     Patient Name: Shelley Barber [id number]'XToday's Date: 11/26/2014 Reason for consult: Initial assessment   Maternal Data Has patient been taught Hand Expression?: Yes Does the patient have breastfeeding experience prior to this delivery?: Yes  Feeding Feeding Type: Breast Fed Length of feed: 40 min  LATCH Score/Interventions Latch: Grasps breast easily, tongue down, lips flanged, rhythmical sucking.  Audible Swallowing: A few with stimulation Intervention(s): Skin to skin;Hand expression  Type of Nipple: Everted at rest and after stimulation  Comfort (Breast/Nipple): Soft / non-tender     Hold (Positioning): Assistance needed to correctly position infant at breast and maintain latch. Intervention(s): Breastfeeding basics reviewed;Support Pillows;Position options;Skin to skin  LATCH Score: 8  Lactation Tools Discussed/Used     Consult Status Consult Status: Follow-up Date: 11/27/14 Follow-up type: In-patient    Jannifer RodneyShoptaw, Jana Lynn 11/26/2014, 10:05 PM

## 2014-11-26 NOTE — H&P (Signed)
39 y.o. 7927w0d  [id number] comes in for post dates induction.  Otherwise has good fetal movement and no bleeding.  Past Medical History  Diagnosis Date  . Kidney stone     kidney stones    Past Surgical History  Procedure Laterality Date  . Cholecystectomy    . Tonsillectomy    . Wisdom tooth extraction      OB History  Gravida Para Term Preterm AB SAB TAB Ectopic Multiple Living  6 3 3  2 2    3     # Outcome Date GA Lbr Len/2nd Weight Sex Delivery Anes PTL Lv  6 Current           5 SAB           4 SAB           3 Term           2 Term           1 Term               History   Social History  . Marital Status: Married    Spouse Name: N/A  . Number of Children: N/A  . Years of Education: N/A   Occupational History  . Not on file.   Social History Main Topics  . Smoking status: Never Smoker   . Smokeless tobacco: Never Used  . Alcohol Use: No  . Drug Use: No  . Sexual Activity: Not on file   Other Topics Concern  . Not on file   Social History Narrative   Review of patient's allergies indicates no known allergies.    Prenatal Transfer Tool  Maternal Diabetes: No Genetic Screening: Normal- NIPT 4246 XY Maternal Ultrasounds/Referrals: Normal Fetal Ultrasounds or other Referrals:  None Maternal Substance Abuse:  No Significant Maternal Medications:  Meds include: Other: Rhogam Significant Maternal Lab Results: Lab values include: Group B Strep positive  Other PNC: uncomplicated.    Filed Vitals:   11/26/14 0710  BP: 118/46  Pulse: 78  Temp:   Resp:      Lungs/Cor:  NAD Abdomen:  soft, gravid Ex:  no cords, erythema SVE:  1-2/50/-2, vtx FHTs:  140s, good STV, NST R Toco:  q occ   A/P   Postdates induction.  GBS POS- PCN  Shelley Barber A

## 2014-11-26 NOTE — Anesthesia Preprocedure Evaluation (Signed)
Anesthesia Evaluation  Patient identified by MRN, date of birth, ID band Patient awake    Reviewed: Allergy & Precautions, NPO status , Patient's Chart, lab work & pertinent test results  History of Anesthesia Complications Negative for: history of anesthetic complications  Airway Mallampati: II  TM Distance: >3 FB Neck ROM: Full    Dental no notable dental hx. (+) Dental Advisory Given   Pulmonary neg pulmonary ROS,  breath sounds clear to auscultation  Pulmonary exam normal       Cardiovascular negative cardio ROS  Rhythm:Regular Rate:Normal     Neuro/Psych negative neurological ROS  negative psych ROS   GI/Hepatic negative GI ROS, Neg liver ROS,   Endo/Other  Morbid obesity  Renal/GU negative Renal ROS  negative genitourinary   Musculoskeletal negative musculoskeletal ROS (+)   Abdominal   Peds negative pediatric ROS (+)  Hematology negative hematology ROS (+)   Anesthesia Other Findings   Reproductive/Obstetrics negative OB ROS (+) Breast feeding                              Anesthesia Physical Anesthesia Plan  ASA: III  Anesthesia Plan: Epidural   Post-op Pain Management:    Induction:   Airway Management Planned:   Additional Equipment:   Intra-op Plan:   Post-operative Plan: Extubation in OR  Informed Consent: I have reviewed the patients History and Physical, chart, labs and discussed the procedure including the risks, benefits and alternatives for the proposed anesthesia with the patient or authorized representative who has indicated his/her understanding and acceptance.   Dental advisory given  Plan Discussed with: CRNA  Anesthesia Plan Comments:         Anesthesia Quick Evaluation

## 2014-11-27 LAB — CBC
HCT: 32.7 % — ABNORMAL LOW (ref 36.0–46.0)
Hemoglobin: 11.3 g/dL — ABNORMAL LOW (ref 12.0–15.0)
MCH: 31.1 pg (ref 26.0–34.0)
MCHC: 34.6 g/dL (ref 30.0–36.0)
MCV: 90.1 fL (ref 78.0–100.0)
PLATELETS: 224 10*3/uL (ref 150–400)
RBC: 3.63 MIL/uL — ABNORMAL LOW (ref 3.87–5.11)
RDW: 13.4 % (ref 11.5–15.5)
WBC: 10.6 10*3/uL — AB (ref 4.0–10.5)

## 2014-11-27 LAB — RPR: RPR: NONREACTIVE

## 2014-11-27 MED ORDER — RHO D IMMUNE GLOBULIN 1500 UNIT/2ML IJ SOSY
300.0000 ug | PREFILLED_SYRINGE | Freq: Once | INTRAMUSCULAR | Status: AC
Start: 2014-11-27 — End: 2014-11-27
  Administered 2014-11-27: 300 ug via INTRAMUSCULAR
  Filled 2014-11-27: qty 2

## 2014-11-27 NOTE — Anesthesia Postprocedure Evaluation (Signed)
  Anesthesia Post-op Note  Patient: Shelley Barber  Procedure(s) Performed: * No procedures listed *  Patient Location: Mother/Baby  Anesthesia Type:Epidural  Level of Consciousness: awake, alert , oriented and patient cooperative  Airway and Oxygen Therapy: Patient Spontanous Breathing  Post-op Pain: none  Post-op Assessment: Post-op Vital signs reviewed, Patient's Cardiovascular Status Stable, Respiratory Function Stable, Patent Airway, No headache, No backache, No residual numbness and No residual motor weakness  Post-op Vital Signs: Reviewed and stable  Last Vitals:  Filed Vitals:   11/27/14 0625  BP: 101/58  Pulse: 60  Temp: 36.4 C  Resp: 18    Complications: No apparent anesthesia complications

## 2014-11-27 NOTE — Lactation Note (Deleted)
This note was copied from the chart of Shelley Claybon JabsBrigitte Levinson. Lactation Consultation Note; Infant is under photo therapy. Mother has cracked sore nipples. She doesn't speak english. FOB at bedside to interpret. She is becoming very full today. She has been unable to pump because #24 flange is too small and caused more pain. Mother fit with #27 flange. Mother pumped approx 45 ml. Advised mother to supplement with EBM/formula after attempting to breastfeed infant. Suggested that mother page for latch assistance for next feeding. Mother has comfort gels . [facility] has been unable to get in touch with them to register.  Patient Name: Shelley Barber ZOXWR'UToday's Date: 11/27/2014 Reason for consult: Follow-up assessment   Maternal Data    Feeding Feeding Type: Formula  LATCH Score/Interventions                      Lactation Tools Discussed/Used     Consult Status Consult Status: Follow-up Date: 11/27/14 Follow-up type: In-patient    Stevan BornKendrick, Shelley Barber 11/27/2014, 1:50 PM

## 2014-11-27 NOTE — Progress Notes (Signed)
Patient is doing well.  She is ambulating, voiding, tolerating PO.  Pain control is good.  Lochia is appropriate  Filed Vitals:   11/26/14 1715 11/26/14 1810 11/26/14 2315 11/27/14 0625  BP: 110/49 111/50 104/56 101/58  Pulse: 80 88 80 60  Temp: 98.1 F (36.7 C) 99 F (37.2 C) 98.2 F (36.8 C) 97.6 F (36.4 C)  TempSrc: Oral Oral Oral Oral  Resp: 20 22 18 18   Height:      Weight:      SpO2:   97% 98%    NAD Fundus firm Ext: 1+ edema b/l  Lab Results  Component Value Date   WBC 10.6* 11/27/2014   HGB 11.3* 11/27/2014   HCT 32.7* 11/27/2014   MCV 90.1 11/27/2014   PLT 224 11/27/2014    --/--/A NEG (02/16 2000)/RImmune  A/P 38 y.o. [id number] PPD#1. Routine care.  Desires d/c tomorrow Rh neg: rhogam studies pending  Desires circumcisison. Discussed r/b/a of the procedure. Reviewed that circumcision is an elective surgical procedure and not considered medically necessary. Reviewed the risks of the procedure including the risk of infection, bleeding, damage to surrounding structures, including scrotum, shaft, urethra and head of penis, and an undesired cosmetic effect requiring additional procedures for revision. Consent signed.     StanleyLARK, [facility], LLCDYANNA

## 2014-11-28 LAB — RH IG WORKUP (INCLUDES ABO/RH)
ABO/RH(D): A NEG
Fetal Screen: NEGATIVE
GESTATIONAL AGE(WKS): 41
Unit division: 0

## 2014-11-28 NOTE — Discharge Summary (Signed)
Obstetric Discharge Summary Reason for Admission: induction of labor Prenatal Procedures: ultrasound Intrapartum Procedures: spontaneous vaginal delivery Postpartum Procedures: none Complications-Operative and Postpartum: none HEMOGLOBIN  Date Value Ref Range Status  11/27/2014 11.3* 12.0 - 15.0 g/dL Final   HCT  Date Value Ref Range Status  11/27/2014 32.7* 36.0 - 46.0 % Final    Physical Exam:  General: alert and cooperative Lochia: appropriate Uterine Fundus: firm DVT Evaluation: No evidence of DVT seen on physical exam.  Discharge Diagnoses: Term Pregnancy-delivered  Discharge Information: Date: 11/28/2014 Activity: pelvic rest Diet: routine Medications: PNV and Ibuprofen Condition: stable Instructions: refer to practice specific booklet Discharge to: home Follow-up Information    Follow up with HORVATH,MICHELLE A, MD In 4 weeks.   Specialty:  Obstetrics and Gynecology   Contact information:   [address] [place] [address] [phone]       Newborn Data: Live born female  Birth Weight: 7 lb 3.9 oz (3285 g) APGAR: 9, 9  Home with mother.  Philip AspenCALLAHAN, Tamim Skog 11/28/2014, 11:33 AM

## 2014-11-29 LAB — TYPE AND SCREEN
ABO/RH(D): A NEG
Antibody Screen: POSITIVE
DAT, IgG: NEGATIVE
UNIT DIVISION: 0
UNIT DIVISION: 0

## 2015-10-18 IMAGING — US US OB DETAIL+14 WK
1 series · 12 of 28 positions shown · non-contrast
Comparison: none

[Series 1: us ob detail +14 wk · 105 acquisitions, 12 frames shown]
[im 4/105]
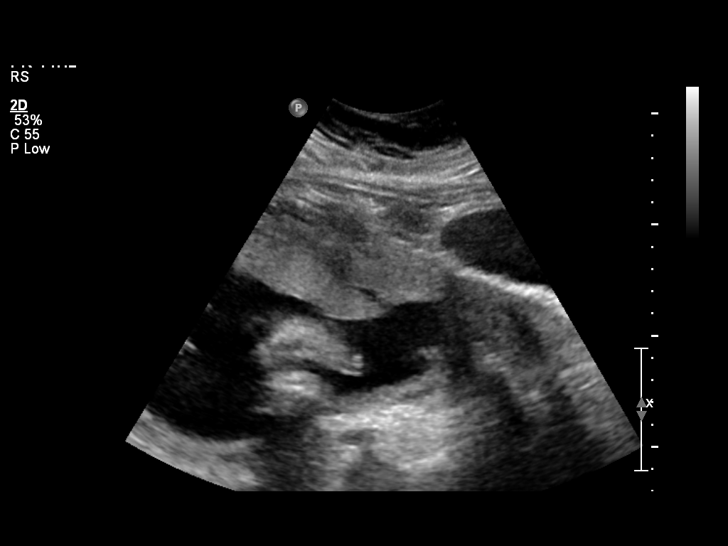
[im 12/105]
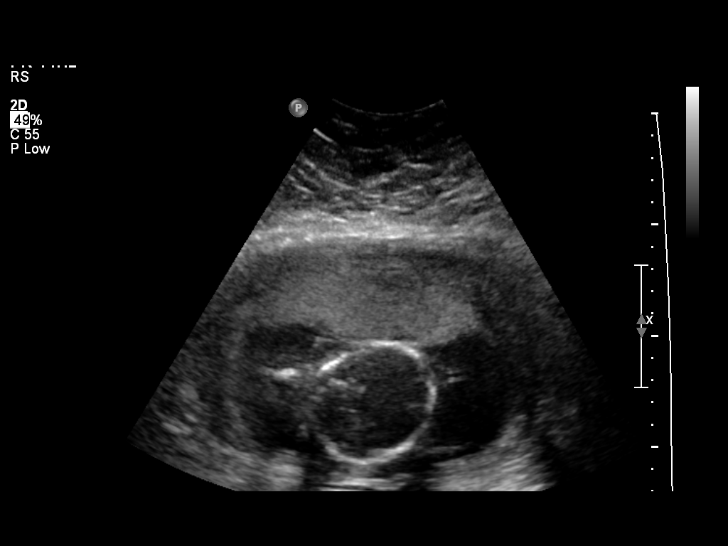
[im 20/105]
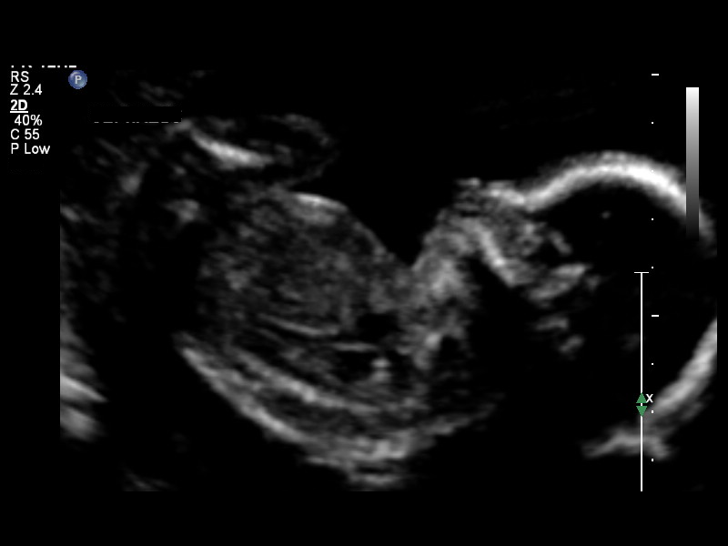
[im 31/105]
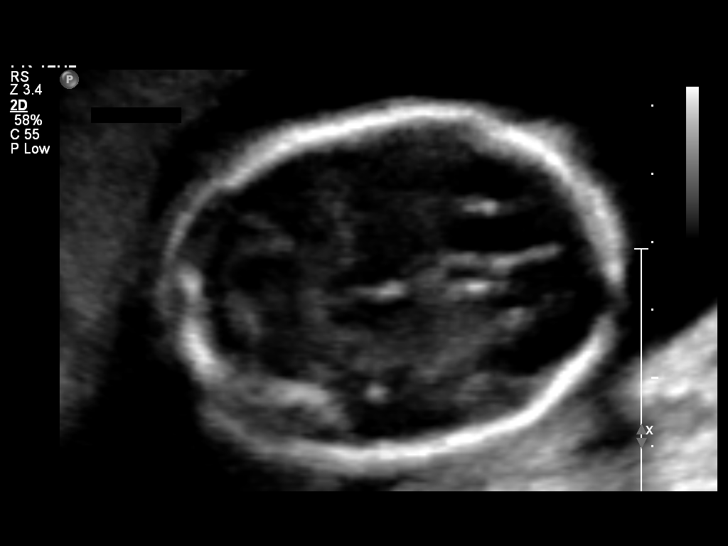
[im 39/105]
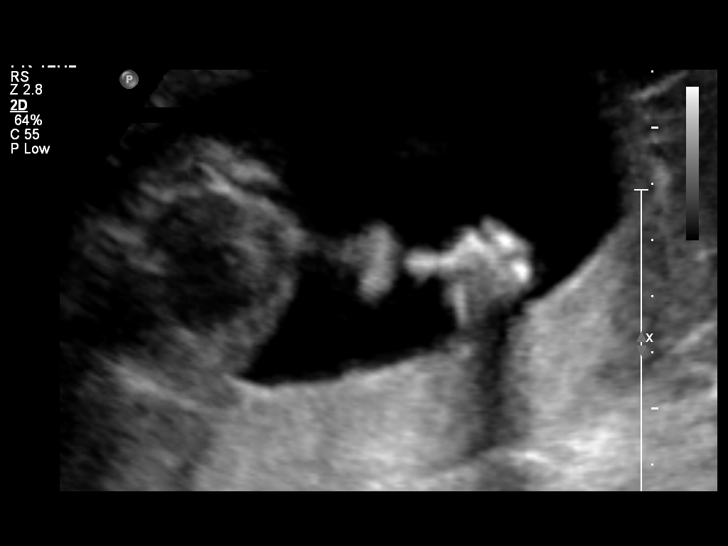
[im 47/105]
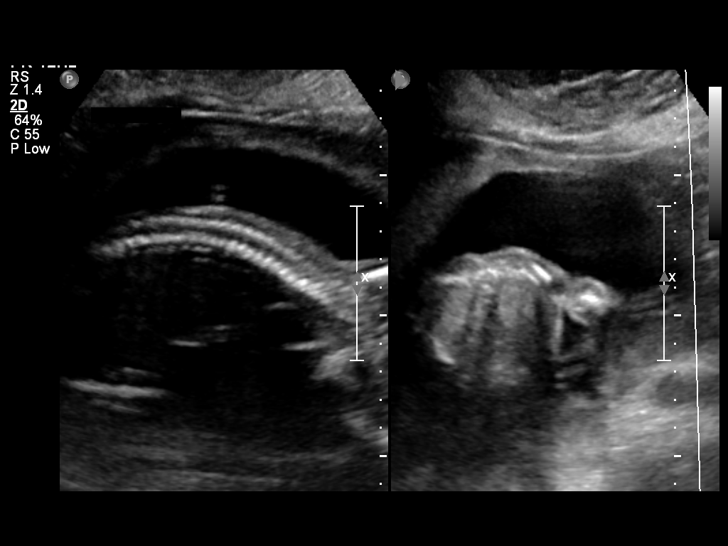
[im 58/105]
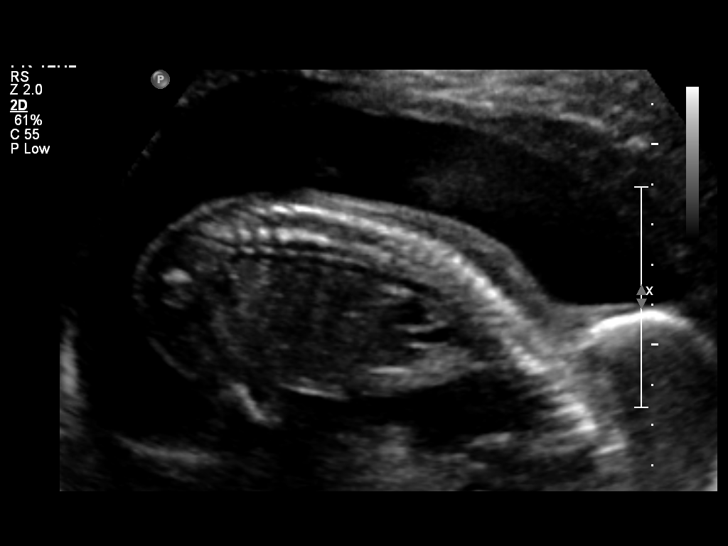
[im 66/105]
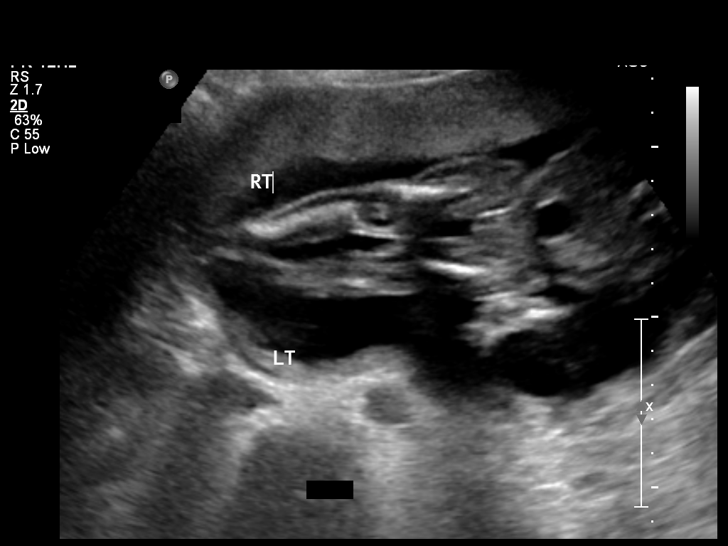
[im 74/105]
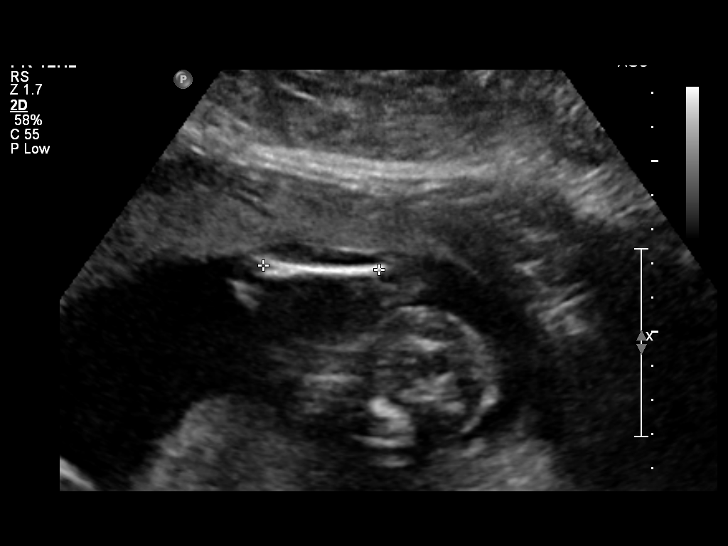
[im 85/105]
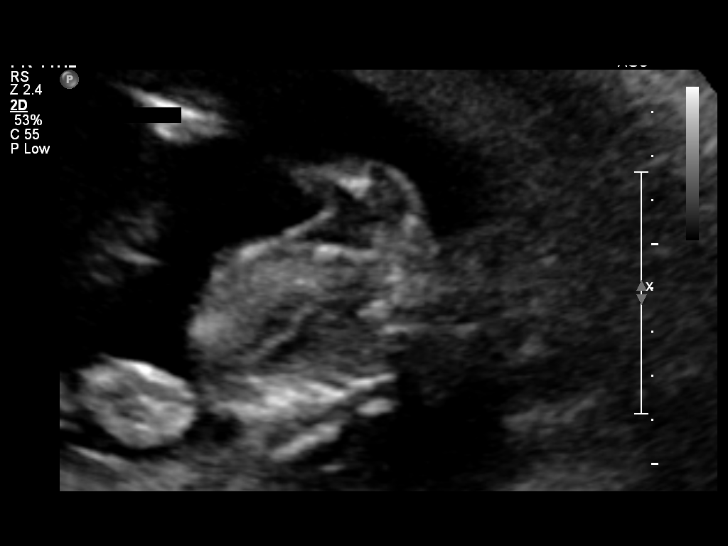
[im 93/105]
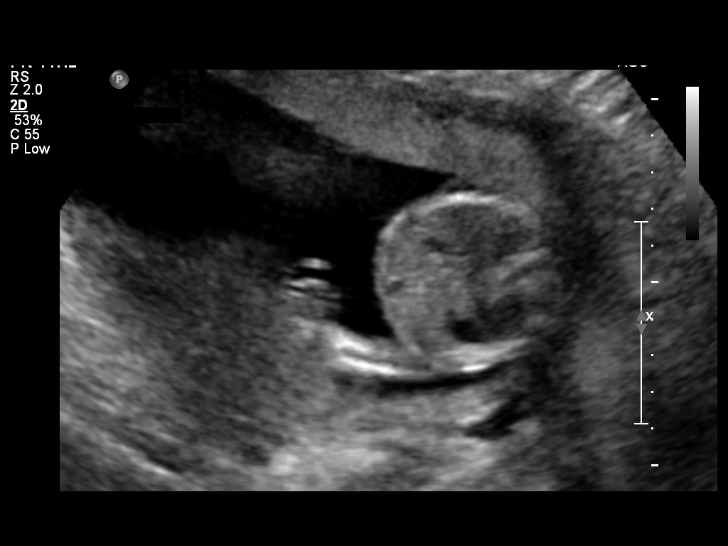
[im 101/105]
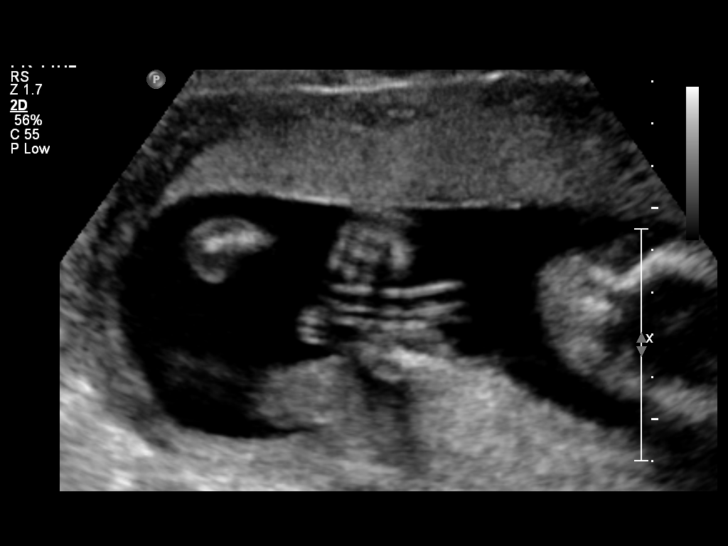

[12 of 28 positions shown; findings below may reference images not displayed]

OBSTETRICS REPORT
                      (Signed Final 07/09/2014 [DATE])

Service(s) Provided

 US OB DETAIL + 14 WK                                  76811.0
Indications

 Advanced maternal age (AMA), Multigravida
 Maternal morbid obesity
 Detailed fetal anatomic survey
Fetal Evaluation

 Num Of Fetuses:    1
 Fetal Heart Rate:  139                          bpm
 Cardiac Activity:  Observed
 Placenta:          Anterior, above cervical os
 P. Cord            Visualized, central
 Insertion:

 Amniotic Fluid
 AFI FV:      Subjectively within normal limits
                                             Larg Pckt:    7.08  cm
Biometry

 BPD:     48.1  mm     G. Age:  20w 4d                CI:         77.1   70 - 86
 OFD:     62.4  mm                                    FL/HC:      19.5   15.9 -

 HC:     184.2  mm     G. Age:  20w 6d       31  %    HC/AC:      1.17   1.06 -

 AC:       158  mm     G. Age:  20w 6d       41  %    FL/BPD:
 FL:      35.9  mm     G. Age:  21w 3d       54  %    FL/AC:      22.7   20 - 24
 HUM:     32.8  mm     G. Age:  21w 0d       48  %
 CER:       22  mm     G. Age:  21w 0d       50  %

 Est. FW:     398  gm    0 lb 14 oz      46  %
Gestational Age

 LMP:           21w 0d        Date:  02/12/14                 EDD:   11/19/14
 Clinical EDD:  21w 0d                                        EDD:   11/19/14
 U/S Today:     21w 0d                                        EDD:   11/19/14
 Best:          21w 0d     Det. By:  LMP  (02/12/14)          EDD:   11/19/14
Anatomy
 Cranium:          Appears normal         Aortic Arch:      Appears normal
 Fetal Cavum:      Appears normal         Ductal Arch:      Appears normal
 Ventricles:       Appears normal         Diaphragm:        Appears normal
 Choroid Plexus:   Appears normal         Stomach:          Appears normal, left
                                                            sided
 Cerebellum:       Appears normal         Abdomen:          Appears normal
 Posterior Fossa:  Appears normal         Abdominal Wall:   Appears nml (cord
                                                            insert, abd wall)
 Nuchal Fold:      Not applicable (>20    Cord Vessels:     Appears normal (3
                   wks GA)                                  vessel cord)
 Face:             Appears normal         Kidneys:          Appear normal
                   (orbits and profile)
 Lips:             Appears normal         Bladder:          Appears normal
 Palate:           Not well visualized    Spine:            Appears normal
 Heart:            Appears normal         Lower             Appears normal
                   (4CH, axis, and        Extremities:
                   situs)
 RVOT:             Not well visualized    Upper             Appears normal
                                          Extremities:
 LVOT:             Appears normal

 Other:  Heels and 5th digit visualized. Fetus appears to be a male.
         Technically difficult due to maternal habitus and fetal position.
Targeted Anatomy

 Fetal Central Nervous System
 Lat. Ventricles:  4.6                    Cisterna Magna:
Cervix Uterus Adnexa

 Cervical Length:    4.74     cm

 Cervix:       Normal appearance by transabdominal scan.
 Uterus:       No abnormality visualized.
 Cul De Sac:   No free fluid seen.

 Left Ovary:    No adnexal mass visualized.
 Right Ovary:   No adnexal mass visualized.
 Adnexa:     No adnexal mass visualized.
Impression

 Single IUP at 21w 0d
 Normal fetal anatomic survey; however, limited views of the
 fetal heart were obtained
 No markers associated with aneuploidy noted
 Anterior placenta without previa
 Normal amniotic fluid volume
Recommendations

 Recommend follow-up ultrasound examination in 4-6 weeks
 for growth and to complete anatomy

## 2015-11-14 IMAGING — US US OB FOLLOW-UP
1 series · 12 of 28 positions shown · non-contrast
Comparison: none

OBSTETRICS REPORT
                      (Signed Final 08/05/2014 [DATE])

Service(s) Provided
 US OB FOLLOW UP                                       76816.1
Indications
 24 weeks gestation of pregnancy
 Basic anatomic survey                                 z36
Fetal Evaluation
 Num Of Fetuses:    1
 Fetal Heart Rate:  136                          bpm
 Cardiac Activity:  Observed
 Presentation:      Breech
 Placenta:          Anterior, above cervical os
 P. Cord            Visualized
 Insertion:
 Amniotic Fluid
 AFI FV:      Subjectively within normal limits
                                             Larg Pckt:     5.6  cm
Biometry
 BPD:     62.2  mm     G. Age:  25w 2d                CI:        71.94   70 - 86
                                                      FL/HC:      19.5   18.7 -
 HC:     233.4  mm     G. Age:  25w 2d       48  %    HC/AC:      1.10   1.04 -
 AC:     212.3  mm     G. Age:  25w 6d       69  %    FL/BPD:     73.2   71 - 87
 FL:      45.5  mm     G. Age:  25w 1d       43  %    FL/AC:      21.4   20 - 24
 Est. FW:     810  gm    1 lb 13 oz      64  %
Gestational Age
 LMP:           24w 6d        Date:  02/12/14                 EDD:   11/19/14
 Clinical EDD:  24w 6d                                        EDD:   11/19/14
 U/S Today:     25w 3d                                        EDD:   11/15/14
 Best:          24w 6d     Det. By:  LMP  (02/12/14)          EDD:   11/19/14
Anatomy
 Cranium:          Appears normal         Aortic Arch:      Previously seen
 Fetal Cavum:      Previously seen        Ductal Arch:      Previously seen
 Ventricles:       Previously seen        Diaphragm:        Previously seen
 Choroid Plexus:   Previously seen        Stomach:          Appears normal, left
                                                            sided
 Cerebellum:       Previously seen        Abdomen:          Appears normal
 Posterior Fossa:  Previously seen        Abdominal Wall:   Previously seen
 Nuchal Fold:      Not applicable (>20    Cord Vessels:     Appears normal (3
                   wks GA)                                  vessel cord)
 Face:             Orbits and profile     Kidneys:          Appear normal
                   previously seen
 Lips:             Previously seen        Bladder:          Appears normal
 Heart:            Previously seen        Spine:            Previously seen
 RVOT:             Appears normal         Lower             Previously seen
                                          Extremities:
 LVOT:             Previously seen        Upper             Previously seen
 Other:  Heels and 5th digit prev. visualized. Fetus appears to be a male.
         Technically difficult due to maternal habitus and fetal position.
Impression
INDICATION: 38 yr old [id number] at 50w2d for follow up
 ultrasound to complete anatomy. Remote read.

[Series 1: us ob follow up · 41 acquisitions, 12 frames shown]
[im 2/41]
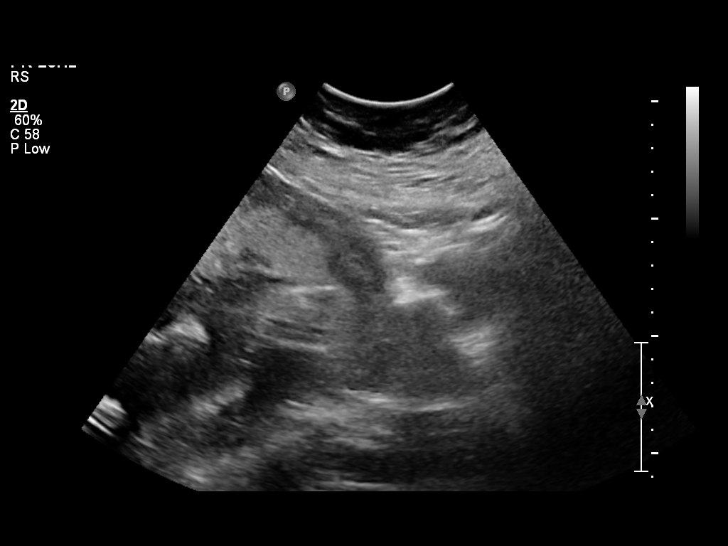
[im 5/41]
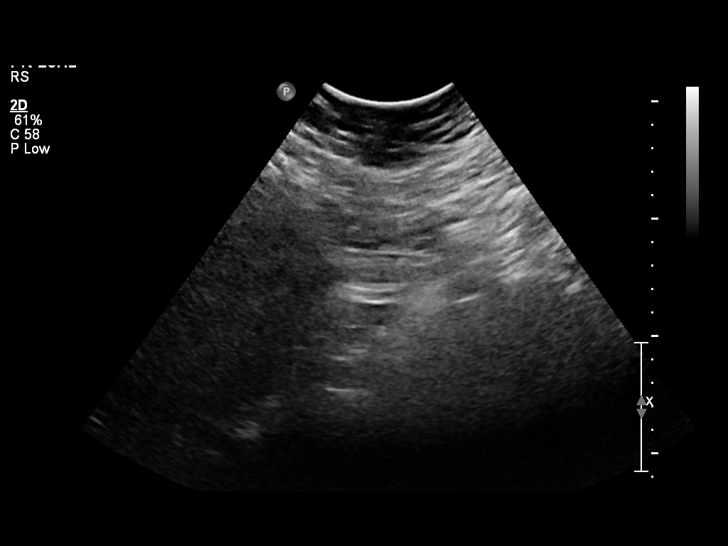
[im 8/41]
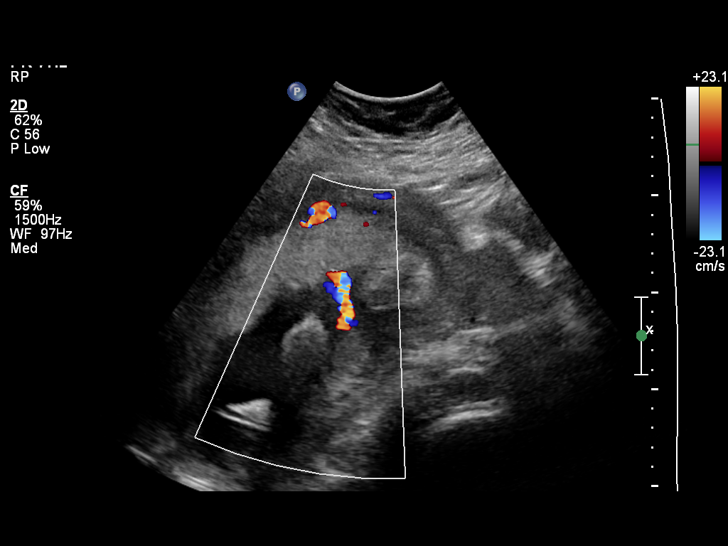
[im 12/41]
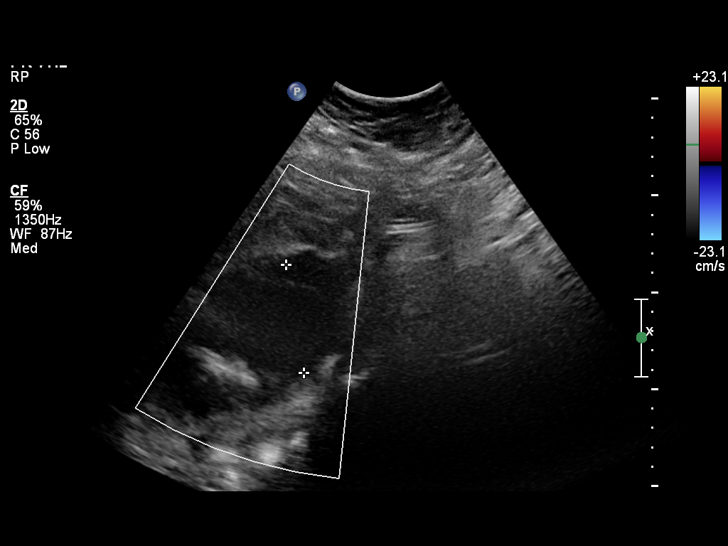
[im 15/41]
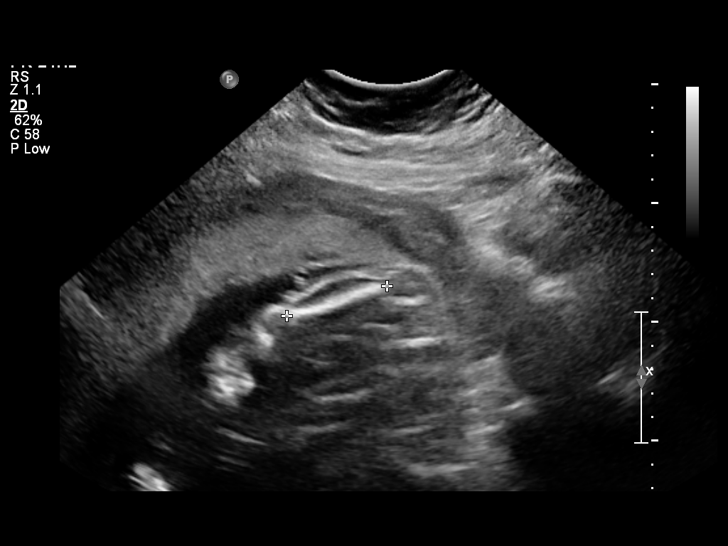
[im 18/41]
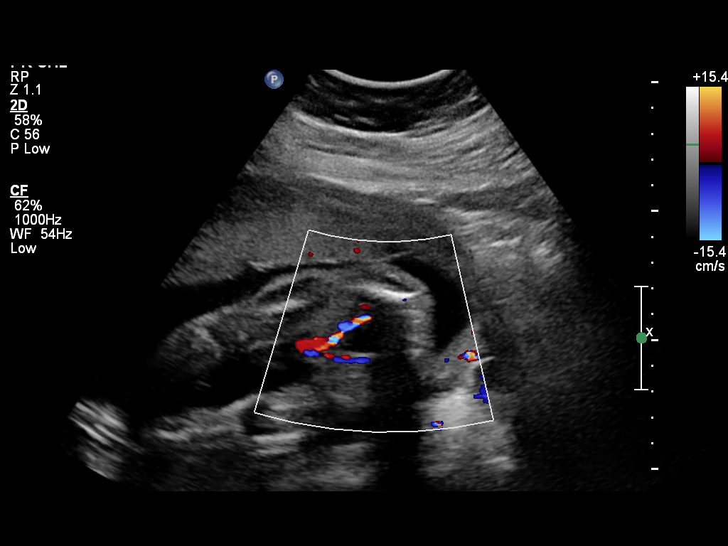
[im 23/41]
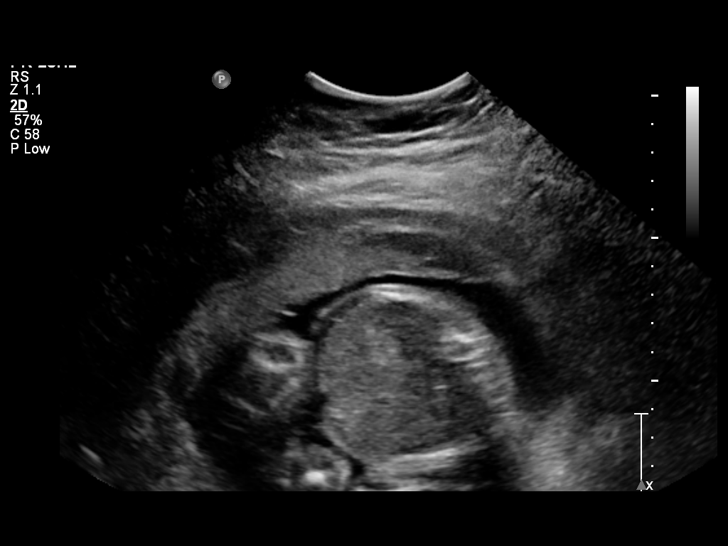
[im 26/41]
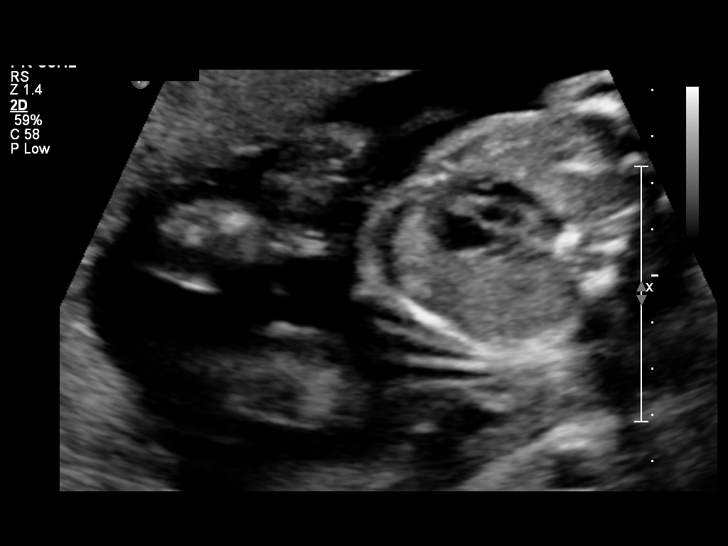
[im 29/41]
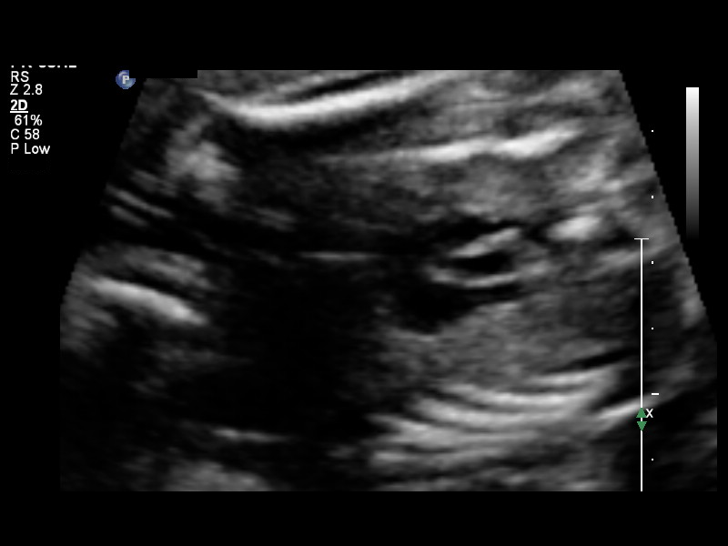
[im 33/41]
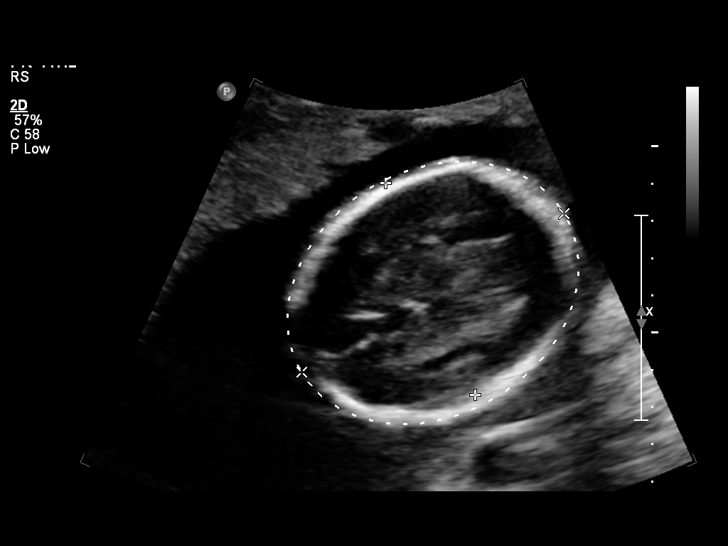
[im 36/41]
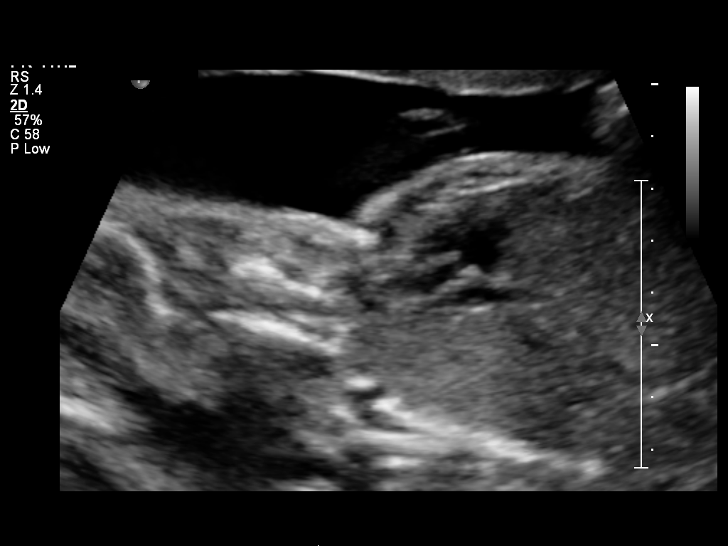
[im 39/41]
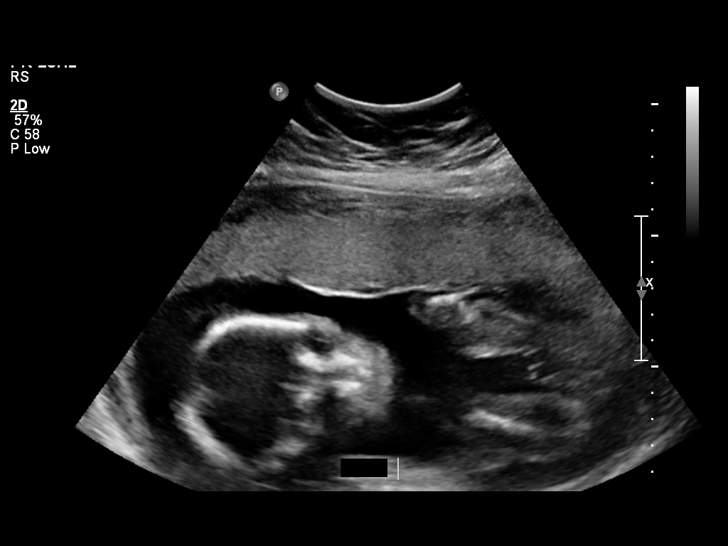

[12 of 28 positions shown; findings below may reference images not displayed]

FINDINGS: 1. Single intrauterine pregnancy.
 2. Estimated fetal weight is in the 64th%.
 3. Anterior placenta without evidence of previa.
 4. Normal amniotic fluid volume.
 5. The limited anatomy survey is normal. Any anatomy not
 evaluated on today's exam was evaluated on the previous
 exam.
Recommendations

 1. Appropriate fetal growth.
 2. Fetal anatomic survey is complete.
 3. Recommend follow up ultrasounds as clinically indicated.

## 2020-09-03 ENCOUNTER — Emergency Department: Admit: 2020-09-03 | Payer: BLUE CROSS/BLUE SHIELD | Primary: Family Medicine

## 2020-09-03 ENCOUNTER — Inpatient Hospital Stay
Admit: 2020-09-03 | Discharge: 2020-09-16 | Disposition: A | Discharge status: Home / Self Care | Payer: BLUE CROSS/BLUE SHIELD | Attending: Internal Medicine | Admitting: Internal Medicine

## 2020-09-03 DIAGNOSIS — A4189 Other specified sepsis: Secondary | ICD-10-CM

## 2020-09-03 LAB — COMPREHENSIVE METABOLIC PANEL
ALT: 44 U/L (ref 12–78)
AST: 66 U/L — ABNORMAL HIGH (ref 15–37)
Albumin/Globulin Ratio: 0.6 — ABNORMAL LOW (ref 1.1–2.2)
Albumin: 2.6 g/dL — ABNORMAL LOW (ref 3.5–5.0)
Alkaline Phosphatase: 79 U/L (ref 45–117)
Anion Gap: 5 mmol/L (ref 5–15)
BUN: 10 MG/DL (ref 6–20)
Bun/Cre Ratio: 13 (ref 12–20)
CO2: 29 mmol/L (ref 21–32)
Calcium: 7.9 MG/DL — ABNORMAL LOW (ref 8.5–10.1)
Chloride: 104 mmol/L (ref 97–108)
Creatinine: 0.8 MG/DL (ref 0.55–1.02)
EGFR IF NonAfrican American: >60 mL/min/{1.73_m2} (ref >60)
GFR African American: >60 mL/min/{1.73_m2} (ref >60)
Globulin: 4.3 g/dL — ABNORMAL HIGH (ref 2.0–4.0)
Glucose: 128 mg/dL — ABNORMAL HIGH (ref 65–100)
Potassium: 3.6 mmol/L (ref 3.5–5.1)
Sodium: 138 mmol/L (ref 136–145)
Total Bilirubin: 0.2 MG/DL (ref 0.2–1.0)
Total Protein: 6.9 g/dL (ref 6.4–8.2)

## 2020-09-03 LAB — COVID-19, RAPID: SARS-CoV-2, Rapid: DETECTED — CR

## 2020-09-03 LAB — CBC WITH AUTO DIFFERENTIAL
Basophils %: 0 % (ref 0–1)
Basophils Absolute: 0 10*3/uL (ref 0.0–0.1)
Eosinophils %: 0 % (ref 0–7)
Eosinophils Absolute: 0 10*3/uL (ref 0.0–0.4)
Granulocyte Absolute Count: 0 10*3/uL (ref 0.00–0.04)
Hematocrit: 44.4 % (ref 35.0–47.0)
Hemoglobin: 14.7 g/dL (ref 11.5–16.0)
Immature Granulocytes: 0 % (ref 0.0–0.5)
Lymphocytes %: 19 % (ref 12–49)
Lymphocytes Absolute: 0.8 10*3/uL (ref 0.8–3.5)
MCH: 30.2 PG (ref 26.0–34.0)
MCHC: 33.1 g/dL (ref 30.0–36.5)
MCV: 91.4 FL (ref 80.0–99.0)
MPV: 10.1 FL (ref 8.9–12.9)
Monocytes %: 4 % — ABNORMAL LOW (ref 5–13)
Monocytes Absolute: 0.2 10*3/uL (ref 0.0–1.0)
NRBC Absolute: 0 10*3/uL (ref 0.00–0.01)
Neutrophils %: 77 % — ABNORMAL HIGH (ref 32–75)
Neutrophils Absolute: 3.4 10*3/uL (ref 1.8–8.0)
Nucleated RBCs: 0 PER 100 WBC
Platelets: 197 10*3/uL (ref 150–400)
RBC: 4.86 M/uL (ref 3.80–5.20)
RDW: 12.4 % (ref 11.5–14.5)
WBC: 4.5 10*3/uL (ref 3.6–11.0)

## 2020-09-03 LAB — TROPONIN, HIGH SENSITIVITY: Troponin, High Sensitivity: 11 ng/L (ref 0–51)

## 2020-09-03 LAB — MAGNESIUM
Magnesium: 2.3 mg/dL (ref 1.6–2.4)
Magnesium: 2.3 mg/dL (ref 1.6–2.4)

## 2020-09-03 LAB — PROCALCITONIN
Procalcitonin: <0.05 ng/mL
Procalcitonin: <0.05 ng/mL

## 2020-09-03 LAB — LACTIC ACID
Lactic Acid: 1.2 MMOL/L (ref 0.4–2.0)
Lactic acid: 1.2 MMOL/L (ref 0.4–2.0)

## 2020-09-03 LAB — C-REACTIVE PROTEIN: CRP: 3.31 mg/dL — ABNORMAL HIGH (ref 0.00–0.60)

## 2020-09-03 LAB — D-DIMER, QUANTITATIVE: D-Dimer, Quant: 4.14 mg/L FEU — ABNORMAL HIGH (ref 0.00–0.65)

## 2020-09-03 LAB — CBC WITH AUTOMATED DIFF
ABS. BASOPHILS: 0 10*3/uL (ref 0.0–0.1)
ABS. EOSINOPHILS: 0 10*3/uL (ref 0.0–0.4)
ABS. IMM. GRANS.: 0 10*3/uL (ref 0.00–0.04)
ABS. LYMPHOCYTES: 0.8 10*3/uL (ref 0.8–3.5)
ABS. MONOCYTES: 0.2 10*3/uL (ref 0.0–1.0)
ABS. NEUTROPHILS: 3.4 10*3/uL (ref 1.8–8.0)
ABSOLUTE NRBC: 0 10*3/uL (ref 0.00–0.01)
BASOPHILS: 0 % (ref 0–1)
EOSINOPHILS: 0 % (ref 0–7)
HCT: 44.4 % (ref 35.0–47.0)
HGB: 14.7 g/dL (ref 11.5–16.0)
IMMATURE GRANULOCYTES: 0 % (ref 0.0–0.5)
LYMPHOCYTES: 19 % (ref 12–49)
MCH: 30.2 PG (ref 26.0–34.0)
MCHC: 33.1 g/dL (ref 30.0–36.5)
MCV: 91.4 FL (ref 80.0–99.0)
MONOCYTES: 4 % — ABNORMAL LOW (ref 5–13)
MPV: 10.1 FL (ref 8.9–12.9)
NEUTROPHILS: 77 % — ABNORMAL HIGH (ref 32–75)
NRBC: 0 PER 100 WBC
PLATELET: 197 10*3/uL (ref 150–400)
RBC: 4.86 M/uL (ref 3.80–5.20)
RDW: 12.4 % (ref 11.5–14.5)
WBC: 4.5 10*3/uL (ref 3.6–11.0)

## 2020-09-03 LAB — D DIMER: D-dimer: 4.14 mg/L FEU — ABNORMAL HIGH (ref 0.00–0.65)

## 2020-09-03 LAB — METABOLIC PANEL, COMPREHENSIVE
A-G Ratio: 0.6 — ABNORMAL LOW (ref 1.1–2.2)
ALT (SGPT): 44 U/L (ref 12–78)
AST (SGOT): 66 U/L — ABNORMAL HIGH (ref 15–37)
Albumin: 2.6 g/dL — ABNORMAL LOW (ref 3.5–5.0)
Alk. phosphatase: 79 U/L (ref 45–117)
Anion gap: 5 mmol/L (ref 5–15)
BUN/Creatinine ratio: 13 (ref 12–20)
BUN: 10 MG/DL (ref 6–20)
Bilirubin, total: 0.2 MG/DL (ref 0.2–1.0)
CO2: 29 mmol/L (ref 21–32)
Calcium: 7.9 MG/DL — ABNORMAL LOW (ref 8.5–10.1)
Chloride: 104 mmol/L (ref 97–108)
Creatinine: 0.8 MG/DL (ref 0.55–1.02)
GFR est AA: >60 mL/min/{1.73_m2} (ref >60)
GFR est non-AA: >60 mL/min/{1.73_m2} (ref >60)
Globulin: 4.3 g/dL — ABNORMAL HIGH (ref 2.0–4.0)
Glucose: 128 mg/dL — ABNORMAL HIGH (ref 65–100)
Potassium: 3.6 mmol/L (ref 3.5–5.1)
Protein, total: 6.9 g/dL (ref 6.4–8.2)
Sodium: 138 mmol/L (ref 136–145)

## 2020-09-03 LAB — TROPONIN-HIGH SENSITIVITY: Troponin-High Sensitivity: 11 ng/L (ref 0–51)

## 2020-09-03 LAB — SAMPLES BEING HELD

## 2020-09-03 LAB — COVID-19 RAPID TEST: COVID-19 rapid test: DETECTED — CR

## 2020-09-03 LAB — C REACTIVE PROTEIN, QT: C-Reactive protein: 3.31 mg/dL — ABNORMAL HIGH (ref 0.00–0.60)

## 2020-09-03 MED ORDER — SODIUM CHLORIDE 0.9% BOLUS IV
0.9 % | Freq: Once | INTRAVENOUS | Status: AC
Start: 2020-09-03 — End: 2020-09-03
  Administered 2020-09-03: 17:00:00 via INTRAVENOUS

## 2020-09-03 MED ORDER — BARICITINIB 2 MG TABLET
2 mg | Freq: Every day | ORAL | Status: AC
Start: 2020-09-03 — End: 2020-09-16
  Administered 2020-09-03 – 2020-09-16 (×14): via ORAL

## 2020-09-03 MED ORDER — SODIUM CHLORIDE 0.9 % IJ SYRG
INTRAMUSCULAR | Status: DC | PRN
Start: 2020-09-03 — End: 2020-09-16

## 2020-09-03 MED ORDER — ENOXAPARIN 40 MG/0.4 ML SUB-Q SYRINGE
40 mg/0.4 mL | Freq: Two times a day (BID) | SUBCUTANEOUS | Status: DC
Start: 2020-09-03 — End: 2020-09-16
  Administered 2020-09-03 – 2020-09-16 (×26): via SUBCUTANEOUS

## 2020-09-03 MED ORDER — ACETAMINOPHEN 650 MG RECTAL SUPPOSITORY
650 mg | Freq: Four times a day (QID) | RECTAL | Status: DC | PRN
Start: 2020-09-03 — End: 2020-09-16

## 2020-09-03 MED ORDER — DEXAMETHASONE 6 MG TAB
6 mg | ORAL | Status: DC
Start: 2020-09-03 — End: 2020-09-06
  Administered 2020-09-04 – 2020-09-05 (×2): via ORAL

## 2020-09-03 MED ORDER — ACETAMINOPHEN 500 MG TAB
500 mg | ORAL | Status: AC
Start: 2020-09-03 — End: 2020-09-03
  Administered 2020-09-03: 17:00:00 via ORAL

## 2020-09-03 MED ORDER — IOPAMIDOL 76 % IV SOLN
76 % | Freq: Once | INTRAVENOUS | Status: AC
Start: 2020-09-03 — End: 2020-09-03
  Administered 2020-09-03: 18:00:00 via INTRAVENOUS

## 2020-09-03 MED ORDER — SODIUM CHLORIDE 0.9 % IJ SYRG
Freq: Three times a day (TID) | INTRAMUSCULAR | Status: DC
Start: 2020-09-03 — End: 2020-09-16
  Administered 2020-09-04 – 2020-09-16 (×36): via INTRAVENOUS

## 2020-09-03 MED ORDER — LACTATED RINGERS IV
INTRAVENOUS | Status: DC
Start: 2020-09-03 — End: 2020-09-04
  Administered 2020-09-03: 22:00:00 via INTRAVENOUS

## 2020-09-03 MED ORDER — ACETAMINOPHEN 325 MG TABLET
325 mg | Freq: Four times a day (QID) | ORAL | Status: DC | PRN
Start: 2020-09-03 — End: 2020-09-16
  Administered 2020-09-04 – 2020-09-12 (×3): via ORAL

## 2020-09-03 MED ORDER — POLYETHYLENE GLYCOL 3350 17 GRAM (100 %) ORAL POWDER PACKET
17 gram | Freq: Every day | ORAL | Status: DC | PRN
Start: 2020-09-03 — End: 2020-09-16

## 2020-09-03 MED ORDER — DEXAMETHASONE SODIUM PHOSPHATE 10 MG/ML IJ SOLN
10 mg/mL | Freq: Once | INTRAMUSCULAR | Status: AC
Start: 2020-09-03 — End: 2020-09-03
  Administered 2020-09-03: 17:00:00 via INTRAVENOUS

## 2020-09-03 MED ORDER — ONDANSETRON (PF) 4 MG/2 ML INJECTION
4 mg/2 mL | INTRAMUSCULAR | Status: AC
Start: 2020-09-03 — End: 2020-09-03
  Administered 2020-09-03: 17:00:00 via INTRAVENOUS

## 2020-09-03 MED FILL — ENOXAPARIN 40 MG/0.4 ML SUB-Q SYRINGE: 40 mg/0.4 mL | SUBCUTANEOUS | Qty: 0.4

## 2020-09-03 MED FILL — LACTATED RINGERS IV: INTRAVENOUS | Qty: 1000

## 2020-09-03 MED FILL — TYLENOL EXTRA STRENGTH 500 MG TABLET: 500 mg | ORAL | Qty: 2

## 2020-09-03 MED FILL — BD POSIFLUSH NORMAL SALINE 0.9 % INJECTION SYRINGE: INTRAMUSCULAR | Qty: 40

## 2020-09-03 MED FILL — ONDANSETRON (PF) 4 MG/2 ML INJECTION: 4 mg/2 mL | INTRAMUSCULAR | Qty: 2

## 2020-09-03 MED FILL — ISOVUE-370  76 % INTRAVENOUS SOLUTION: 370 mg iodine /mL (76 %) | INTRAVENOUS | Qty: 100

## 2020-09-03 MED FILL — OLUMIANT 2 MG TABLET: 2 mg | ORAL | Qty: 2

## 2020-09-03 MED FILL — DEXAMETHASONE SODIUM PHOSPHATE 10 MG/ML IJ SOLN: 10 mg/mL | INTRAMUSCULAR | Qty: 1

## 2020-09-03 MED FILL — SODIUM CHLORIDE 0.9 % IV: INTRAVENOUS | Qty: 1000

## 2020-09-03 MED FILL — BD POSIFLUSH NORMAL SALINE 0.9 % INJECTION SYRINGE: INTRAMUSCULAR | Qty: 10

## 2020-09-03 NOTE — H&P (Signed)
H&P by Jamie Brookes, MD at  09/03/20 1359                Author: Jamie Brookes, MD  Service: Internal Medicine  Author Type: Physician       Filed: 09/03/20 1533  Date of Service: 09/03/20 1359  Status: Signed          Editor: Jamie Brookes, MD (Physician)                       [facility]   [address] [place], [place]  [address]   [phone]      Hospitalist Admission Note         NAME:  Teresa Allen     DOB:   December 11, 1975    MRN:  [id number]       PCP:  None       Date of Service/Time:  09/03/2020 1:59 PM                  Assessment / Plan:          44 y.o. female with morbid obesity  otherwise no significant PMH, recent dx of COVID-19 presenting w/ worsening dyspnea, admitted for AHRF 2/2 COVID-19        Acute respiratory failure with hypoxia: 2/2 COVID-19. CTA chest showed no e/o PE. Supplemental O2, currently on 4L NC. Tx as below        Pneumonia due to COVID-19 virus: unvaccinated. Multilobar PNA evident on CT chest, moderately severe. Start dexamethasone. CRP and CRP elevated; will also start baricitinib; discussed w/ pharmacist. Follow d-dimer, procalcitonin, and CRP.         Elevated liver enzymes: minimal. Follow LFTs, especially on baricitinib         SIRS (systemic inflammatory response syndrome): no sepsis. Low procalcitonin. Monitor closely off abx        Morbid obesity: Body mass index is 58.53 kg/m??. RF for severe COVID. Would benefit from weight loss and dietary / lifestyle modifications            Code Status: FULL       Surrogate decision maker: family         ED notes, lab results, and imaging studies reviewed.          Total time spent with patient: 50 Minutes    Time spent in the care of this patient included reviewing records, discussing with nursing, obtaining history and examining the patient, and discussing treatment plans, with >50% time spent counseling/coordinating care      Risk of deterioration: High                   Care Plan discussed  with: ED provider, Patient, Nursing Staff and >50% of time spent in counseling and coordination of care      Discussed:  Care Plan and D/C Planning      Prophylaxis:  Lovenox      Disposition:  Home w/Family                           Subjective:        CHIEF COMPLAINT: SOB       HISTORY OF PRESENT ILLNESS:      Ms. Teresa Allen is a  44 y.o. female w/ hx of morbid obesity otherwise no significant PMH, recent dx of COVID-19  presenting w/ dyspnea. Diagnosed w/ COVID  last Thursday. Since then, has had progressively worsening dyspnea, moderately severe, associated with cough, fevers.       ED workup including CTA chest showing no PE however multilobar PNA.      Ms. Teresa Allen is admitted for further  evaluation and management.         PMH: no significant PMH              Social History          Tobacco Use         ?  Smoking status:  No     ?  Smokeless tobacco:  No       Substance Use Topics         ?  Alcohol use:  No            Family History: No family history of CV disease      No Known Allergies         Prior to Admission medications        Not on File           Review of Systems:   (bold if positive,  if negative)      Gen:   , fever, chills, fatigueEyes:  ENT:  CVS:   chest painPulm:  Cough, dyspnea,GI:  GU:  MS:  Pain, weakness,Skin:  Psych:  Endo:  Hem:  Renal:  Neuro:                      Objective:         VITALS:     Vital signs reviewed; most recent are:      Visit Vitals      BP  102/82     Pulse  92     Temp  (!) 103.1 ??F (39.5 ??C)     Resp  20        SpO2  96%        SpO2 Readings from Last 6 Encounters:      09/03/20  96%           O2 Flow Rate (L/min): 4 l/min     No intake or output data in the 24 hours ending 09/03/20 1359               Exam:        Physical Exam:      Gen:  Obese. Ill-appearing   HEENT:  No scleral icterus,  hearing intact to voice   Resp:  No accessory muscle use. Increased WOB. Mildly diminished without wheezing, rales, rhonchi   Card: RRR. Normal S1 and S2 without murmurs, rubs, or  gallops.  No pitting peripheral lower extremity edema. No JVD. Peripheral pulses in tact.    Abd:  Normoactive bowel sounds. Soft, non-tender, non-distended. No rebound, no guarding. No appreciable hepatosplenomegaly    Musc:  No cyanosis or clubbing   Skin:  No rashes or ulcers; turgor intact.    Neuro:  Cranial nerves are grossly intact, no focal motor weakness, follows commands appropriately   Psych:  Good insight, normal affect. Alert, oriented x 3. Answers questions appropriately          Labs:        Recent Labs           09/03/20   1141     WBC  4.5     HGB  14.7     HCT  44.4  PLT  197          Recent Labs           09/03/20   1141     NA  138     K  3.6     CL  104     CO2  29     GLU  128*     BUN  10     CREA  0.80     CA  7.9*     MG  2.3     ALB  2.6*        ALT  44        No components found for: GLPOC   No results for input(s): PH, PCO2, PO2, HCO3, FIO2 in the last 72 hours.   No results for input(s): INR, INREXT in the last 72 hours.   No results found for: SDES   No results found for: CULT   All other current labs reviewed in the computer.         Imaging/Studies:      CTA CHEST W OR W WO CONT      Result Date: 09/03/2020   Multifocal pneumonia. No evidence for pulmonary embolism      XR CHEST PORT      Result Date: 09/03/2020   Multilobar pneumonia      Imaging personally reviewed.      EKG: sinus tach, NSST abn   EKG personally reviewed      ___________________________________________________      Attending Physician: Jamie Brookes, MD

## 2020-09-03 NOTE — ED Notes (Signed)
Pt resting, dinner tray provided to patient.

## 2020-09-03 NOTE — ED Notes (Signed)
Pt given incentive spirometer.  Fluids put on a pump because they werent' going in fast enough in the bend of her arm.  Pt reports feeling very weak.  Pillow given and lights dimmed.   Call bell within reach.

## 2020-09-03 NOTE — ED Notes (Signed)
Report to Brodie RN

## 2020-09-03 NOTE — ED Notes (Signed)
Brother in law Trey Paula can be given updates and will be the point of contact for the family.  He can be reached at [phone]

## 2020-09-03 NOTE — ED Notes (Signed)
Pt placed on pure wick, call bell within reach, blanket provided and water given.  Pt resting and aware of the plan to admit.

## 2020-09-03 NOTE — ED Notes (Signed)
Pt arrives via EMS to ED for N/V. Pt dx'd covid 1 week ago. 89% on RA in EMS arrival and placed on 4L. Unvaccinated

## 2020-09-03 NOTE — ED Provider Notes (Signed)
Aleera Gilcrease is a 44 yo F who was diagnosed with COVID 1 week ago who has had vomiting and diarrhea for the past several days.  Her family called 911 when she felt too weak to get out of bed.  When EMS arrived her O2 sat was 89% on room air and she was started on 4L O2 which brought her O2 sat to 96%.  Patient denies other medical history and does not take any medications regularly and has not been vaccinated for COVID.             No past medical history on file.    No past surgical history on file.      No family history on file.    Social History     Socioeconomic History   ??? Marital status: Not on file     Spouse name: Not on file   ??? Number of children: Not on file   ??? Years of education: Not on file   ??? Highest education level: Not on file   Occupational History   ??? Not on file   Tobacco Use   ??? Smoking status: Not on file   ??? Smokeless tobacco: Not on file   Substance and Sexual Activity   ??? Alcohol use: Not on file   ??? Drug use: Not on file   ??? Sexual activity: Not on file   Other Topics Concern   ??? Not on file   Social History Narrative   ??? Not on file     Social Determinants of Health     Financial Resource Strain:    ??? Difficulty of Paying Living Expenses: Not on file   Food Insecurity:    ??? Worried About Running Out of Food in the Last Year: Not on file   ??? Ran Out of Food in the Last Year: Not on file   Transportation Needs:    ??? Lack of Transportation (Medical): Not on file   ??? Lack of Transportation (Non-Medical): Not on file   Physical Activity:    ??? Days of Exercise per Week: Not on file   ??? Minutes of Exercise per Session: Not on file   Stress:    ??? Feeling of Stress : Not on file   Social Connections:    ??? Frequency of Communication with Friends and Family: Not on file   ??? Frequency of Social Gatherings with Friends and Family: Not on file   ??? Attends Religious Services: Not on file   ??? Active Member of Clubs or Organizations: Not on file   ??? Attends Banker Meetings: Not on file    ??? Marital Status: Not on file   Intimate Partner Violence:    ??? Fear of Current or Ex-Partner: Not on file   ??? Emotionally Abused: Not on file   ??? Physically Abused: Not on file   ??? Sexually Abused: Not on file   Housing Stability:    ??? Unable to Pay for Housing in the Last Year: Not on file   ??? Number of Places Lived in the Last Year: Not on file   ??? Unstable Housing in the Last Year: Not on file         ALLERGIES: Patient has no allergy information on record.    Review of Systems   Constitutional: Positive for fatigue. Negative for fever.   HENT: Negative for sore throat.    Eyes: Negative for visual disturbance.   Respiratory: Positive for  cough and shortness of breath.    Cardiovascular: Negative for chest pain.   Gastrointestinal: Positive for diarrhea, nausea and vomiting. Negative for abdominal pain.   Genitourinary: Negative for dysuria.   Musculoskeletal: Negative for back pain.   Skin: Negative for rash.   Neurological: Negative for headaches.       Vitals:    09/03/20 1211 09/03/20 1213 09/03/20 1215   BP: 102/82     Pulse: (!) 108  (!) 106   Resp: 15  25   Temp: (!) 103.1 ??F (39.5 ??C)     SpO2: 95% (!) 89% 95%            Physical Exam  Vitals and nursing note reviewed.   Constitutional:       General: She is not in acute distress.     Appearance: She is well-developed. She is obese.   HENT:      Head: Normocephalic and atraumatic.   Eyes:      Conjunctiva/sclera: Conjunctivae normal.   Neck:      Trachea: Phonation normal.   Cardiovascular:      Rate and Rhythm: Normal rate.   Pulmonary:      Effort: Pulmonary effort is normal. No respiratory distress.      Breath sounds: No wheezing or rales.   Abdominal:      General: There is no distension.   Musculoskeletal:         General: No tenderness. Normal range of motion.      Cervical back: Normal range of motion.   Skin:     General: Skin is warm and dry.   Neurological:      Mental Status: She is alert. She is not disoriented.      Motor: No abnormal  muscle tone.        ED EKG interpretation:  Rhythm: sinus tachycardia and incomplete RBBB; and regular . Rate (approx.): 107; Axis: normal; P wave: normal; QRS interval: normal ; ST/T wave: non-specific changes; Other findings: abnormal ekg. This EKG was interpreted by Jorje Guild, MD,ED Provider.    MDM       Perfect Serve Consult for Admission  1:19 PM    ED Room Number: ER12/12  Patient Name and age:  Analei Whinery 44 y.o.  female  Working Diagnosis:   1. COVID    2. Hypoxia        COVID-19 Suspicion:  yes  Sepsis present:  no  Reassessment needed: N/A  Code Status:  Full Code  Readmission: no  Isolation Requirements:  yes  Recommended Level of Care:  med/surg  Department:[facility] - [phone]  Other:  Patient diagnosed with COVID last week with increasing, shortness of breath, fatigue, vomiting and diarrhea.  O2 sat was 89% on room air,  96% on 4L.  CTA negative for PE.  Has received 6mg  dexamethasone in ED.   Procedures

## 2020-09-03 NOTE — Progress Notes (Signed)
 Pharmacy Dosing Services: 09/03/20    Consult for Enoxaparin  by Dr. Arminda  Consult made for this 44 y.o. female, for prophylaxis of  VTE.  Wt Readings from Last 1 Encounters:   09/03/20 145.2 kg (320 lb)       Ht Readings from Last 1 Encounters:   09/03/20 157.5 cm (62)             Previous Dose Enoxaparin  40mg  daily      Creatinine Clearance Estimated Creatinine Clearance: 127.4 mL/min (based on SCr of 0.8 mg/dL).      Creatinine Lab Results   Component Value Date/Time    Creatinine 0.80 09/03/2020 11:41 AM         Platelet Lab Results   Component Value Date/Time    PLATELET 197 09/03/2020 11:41 AM        H/H Lab Results   Component Value Date/Time    HGB 14.7 09/03/2020 11:41 AM            Pharmacist made change to enoxaparin  therapy based on:  [ ]  Renal function: dose changed to:  [ X ] BMI: dose changed to: enoxaparin  40mg  every 12 hours for BMI > 40    Pharmacy to automatically make dose adjustment for renal dysfunction (creatinine clearance less than 30 mL/min)  Pharmacy to automatically make dose adjustment for obesity (BMI greater than 40)  Pharmacy to make dose rounding adjustments per [facility] dose adjustment scale.    Pharmacy to monitor patient's progress.  Will make dose adjustment as needed per changing renal function.  Will communicate further recommendations regarding patient's anticoagulation therapy with prescriber.    Signed Ashley-Nicole Loving . Contact information: [phone]

## 2020-09-03 NOTE — Progress Notes (Signed)
 Baricitinib Initiation Note    This patient meets criteria for initiation of baricitinib based on the following:  Confirmed COVID-19 (+) and hospitalized  Requiring oxygen support - specific O2 saturation is not a determinant for baricitinib  Elevated inflammatory markers (CRP, D-dimer, LDH, or ferritin).   Concomitant therapy with dexamethasone 6-20 mg IV/PO daily (or equivalent steroid)  Do not give if patient has already received tocilizumab for COVID-19 treatment due to long half-life of tocilizumab (16 days)     Prescribers should weigh risks/benefits before initiating therapy in patients with the following:   Pregnancy  Severe hepatic impairment  Chronic/recurrent infections  Hemoglobin <8.0 g/dL   History/current thrombosis    Plan:  - Baricitinib 4 mg po daily X 14 days or until hospital discharge, whichever is sooner, has been ordered.  Dose has been adjusted for renal function.  - Renal function will be monitored daily while on baricitinib.  -VTE prophylaxis is recommended unless contraindicated.     Thank you,      Shawnie Pons, PharmD, BCPS

## 2020-09-04 MED ORDER — LACTATED RINGERS BOLUS IV
Freq: Once | INTRAVENOUS | Status: AC
Start: 2020-09-04 — End: 2020-09-04
  Administered 2020-09-04: 20:00:00 via INTRAVENOUS

## 2020-09-04 MED FILL — OLUMIANT 2 MG TABLET: 2 mg | ORAL | Qty: 2

## 2020-09-04 MED FILL — LACTATED RINGERS IV: INTRAVENOUS | Qty: 1000

## 2020-09-04 MED FILL — ENOXAPARIN 40 MG/0.4 ML SUB-Q SYRINGE: 40 mg/0.4 mL | SUBCUTANEOUS | Qty: 0.4

## 2020-09-04 MED FILL — DEXAMETHASONE 6 MG TAB: 6 mg | ORAL | Qty: 1

## 2020-09-04 MED FILL — ACETAMINOPHEN 325 MG TABLET: 325 mg | ORAL | Qty: 2

## 2020-09-04 NOTE — Progress Notes (Signed)
Bedside and Verbal shift change report given to Lianne Moris, RN (oncoming nurse) by Nettie Elm, RN (offgoing nurse). Report included the following information SBAR, Kardex, Procedure Summary, Intake/Output, MAR, Accordion, Recent Results and Med Rec Status.

## 2020-09-04 NOTE — Progress Notes (Signed)
 09/04/2020   CARE MANAGEMENT NOTE:    Reason for Admission:  COVID                     RUR Score:   5%; Low risk                  Plan for utilizing home health:   TBD       PCP: First and Last name:  None     Name of Practice:    Are you a current patient: Yes/No:    Approximate date of last visit:    Can you participate in a virtual visit with your PCP:                     Current Advanced Directive/Advance Care Plan: Full Code      Healthcare Decision Maker:   Click here to complete HealthCare Decision Makers including selection of the Healthcare Decision Maker Relationship (ie Primary)           Initial Assessment - unable to complete phone assessment as pt didn't answer phone x 2 attempts.                    Transition of Care Plan:   1.  Pt will return home   2.  Pt is listed as Medicaid pending  3.  Pt will need to arrange her own transportation home    CM will continue to follow pt until discharged.  D.Wegner

## 2020-09-04 NOTE — Progress Notes (Signed)
Progress  Notes by Glenetta Hew, MD at 09/04/20 1213                Author: Glenetta Hew, MD  Service: Hospitalist  Author Type: Physician       Filed: 09/04/20 1216  Date of Service: 09/04/20 1213  Status: Signed          Editor: Glenetta Hew, MD (Physician)                          [place] [facility]   [address] [place], [place] [address]   [phone]         Medical Progress Note         NAME: Teresa Allen    DOB:  1976/07/05   MRM:  [id number]      Date/Time: 09/04/2020                    Assessment / Plan:        44 y.o. female with morbid obesity otherwise no significant PMH, recent dx of COVID-19 presenting w/ worsening dyspnea, admitted for AHRF 2/2 COVID-19   ??     Acute respiratory failure with hypoxia: 2/2 COVID-19. CTA chest showed no e/o PE. Supplemental O2, currently on 4L NC. Tx as below   ??     Pneumonia due to COVID-19 virus: unvaccinated. Multilobar PNA evident on CT chest, moderately severe. Markers reassuring. High risk given her obesity    - Dex 6mg  11/25    - Baricitinib 11/25    - LMWH ppx    - Supportive care, IS   ??     Elevated liver enzymes: minimal. Follow LFTs, especially on baricitinib    ??     Sepsis: Fever, tachycardia, viral source. Low c/f bacterial component. Low risk for shock. Does not need aggressive fluid resuscitation, gentle as needed   ??     Morbid obesity: Body mass index is 58.53 kg/m??. RF for severe COVID. Would benefit from weight loss and dietary / lifestyle modifications                  Care Plan discussed with: Patient, Care Manager and Nursing Staff      Discussed:  Care Plan      Prophylaxis:  Lovenox      Disposition:  Home w/Family             ___________________________________________________      Attending Physician: 12/25, MD                Subjective:        Chief Complaint:  NAEON, feels  weak, crumby, out of it; breathing is "OK"      ROS:   (bold if positive,  if negative)      Tolerating  PT  Tolerating Diet                   Objective:             Vitals:               Last 24hrs VS reviewed since prior progress note. Most recent are:      Visit Vitals      BP  (!) 82/47 (BP 1 Location: Right upper arm, BP Patient Position: Lying)     Pulse  88     Temp  99.1 ??F (37.3 ??C)  Resp  18     Ht  5\' 2"  (1.575 m)     Wt  145.2 kg (320 lb)     SpO2  92%        BMI  58.53 kg/m??        SpO2 Readings from Last 6 Encounters:      09/04/20  92%           O2 Flow Rate (L/min): 4 l/min           Intake/Output Summary (Last 24 hours) at 09/04/2020 1213   Last data filed at 09/03/2020 1414     Gross per 24 hour        Intake  1000 ml        Output  --        Net  1000 ml                 Exam:        Physical Exam:      Gen:  Well-developed, well-nourished, in no acute distress, morbidly obese   HEENT:  Pink conjunctivae, PERRL, hearing intact to voice, moist mucous membranes   Neck:  Supple, without masses, thyroid non-tender   Resp:  No accessory muscle use,   Card:  No murmurs, normal S1, S2 without thrills, bruits or peripheral edema   Abd:  Soft, non-tender, non-distended, normoactive bowel sounds are present   Musc:  No cyanosis or clubbing   Skin:  No rashes or ulcers, skin turgor is good   Neuro:  Cranial nerves 3-12 are grossly intact, grip strength is 5/5 bilaterally and dorsi / plantarflexion is 5/5 bilaterally, follows commands  appropriately   Psych:  Good insight, oriented to person, place and time, alert          Medications Reviewed: (see below)      Lab Data Reviewed: (see below)      ______________________________________________________________________        Medications:          Current Facility-Administered Medications          Medication  Dose  Route  Frequency           ?  sodium chloride (NS) flush 5-10 mL   5-10 mL  IntraVENous  PRN     ?  sodium chloride (NS) flush 5-40 mL   5-40 mL  IntraVENous  Q8H     ?  sodium chloride (NS) flush 5-40 mL   5-40 mL  IntraVENous  PRN     ?   acetaminophen (TYLENOL) tablet 650 mg   650 mg  Oral  Q6H PRN          Or           ?  acetaminophen (TYLENOL) suppository 650 mg   650 mg  Rectal  Q6H PRN     ?  polyethylene glycol (MIRALAX) packet 17 g   17 g  Oral  DAILY PRN     ?  enoxaparin (LOVENOX) injection 40 mg   40 mg  SubCUTAneous  Q12H     ?  dexAMETHasone (DECADRON) tablet 6 mg   6 mg  Oral  Q24H     ?  baricitinib (OLUMIANT) tablet 4 mg   4 mg  Oral  DAILY           ?  lactated Ringers infusion   75 mL/hr  IntraVENous  CONTINUOUS  Lab Review:          Recent Labs           09/03/20   1141     WBC  4.5     HGB  14.7     HCT  44.4        PLT  197          Recent Labs           09/03/20   1141     NA  138     K  3.6     CL  104     CO2  29     GLU  128*     BUN  10     CREA  0.80     CA  7.9*     MG  2.3     ALB  2.6*        ALT  44        No components found for: [facility]

## 2020-09-04 NOTE — Progress Notes (Signed)
Removed O2 cannula for Pt to use bathroom. Pt remained at 90-91%. Reinserted cannula Pt quicklyrose to 97% O2 sat. Reduced from 4L to 3L. After 5 min Pt at 96%. Leaving at 3L

## 2020-09-05 LAB — CBC WITH AUTO DIFFERENTIAL
Basophils %: 0 % (ref 0–1)
Basophils %: 0 % (ref 0–1)
Basophils Absolute: 0 10*3/uL (ref 0.0–0.1)
Basophils Absolute: 0 10*3/uL (ref 0.0–0.1)
Eosinophils %: 0 % (ref 0–7)
Eosinophils %: 0 % (ref 0–7)
Eosinophils Absolute: 0 10*3/uL (ref 0.0–0.4)
Eosinophils Absolute: 0 10*3/uL (ref 0.0–0.4)
Granulocyte Absolute Count: 0.1 10*3/uL — ABNORMAL HIGH (ref 0.00–0.04)
Granulocyte Absolute Count: 0.1 10*3/uL — ABNORMAL HIGH (ref 0.00–0.04)
Hematocrit: 39.1 % (ref 35.0–47.0)
Hematocrit: 39.7 % (ref 35.0–47.0)
Hemoglobin: 12.8 g/dL (ref 11.5–16.0)
Hemoglobin: 13.2 g/dL (ref 11.5–16.0)
Immature Granulocytes: 1 % — ABNORMAL HIGH (ref 0.0–0.5)
Immature Granulocytes: 1 % — ABNORMAL HIGH (ref 0.0–0.5)
Lymphocytes %: 9 % — ABNORMAL LOW (ref 12–49)
Lymphocytes %: 9 % — ABNORMAL LOW (ref 12–49)
Lymphocytes Absolute: 0.8 10*3/uL (ref 0.8–3.5)
Lymphocytes Absolute: 0.6 10*3/uL — ABNORMAL LOW (ref 0.8–3.5)
MCH: 30.8 PG (ref 26.0–34.0)
MCH: 30.6 PG (ref 26.0–34.0)
MCHC: 32.7 g/dL (ref 30.0–36.5)
MCHC: 33.2 g/dL (ref 30.0–36.5)
MCV: 94.2 FL (ref 80.0–99.0)
MCV: 91.9 FL (ref 80.0–99.0)
MPV: 10.1 FL (ref 8.9–12.9)
MPV: 9.8 FL (ref 8.9–12.9)
Monocytes %: 6 % (ref 5–13)
Monocytes %: 4 % — ABNORMAL LOW (ref 5–13)
Monocytes Absolute: 0.5 10*3/uL (ref 0.0–1.0)
Monocytes Absolute: 0.2 10*3/uL (ref 0.0–1.0)
NRBC Absolute: 0 10*3/uL (ref 0.00–0.01)
NRBC Absolute: 0 10*3/uL (ref 0.00–0.01)
Neutrophils %: 84 % — ABNORMAL HIGH (ref 32–75)
Neutrophils %: 86 % — ABNORMAL HIGH (ref 32–75)
Neutrophils Absolute: 7.1 10*3/uL (ref 1.8–8.0)
Neutrophils Absolute: 5.3 10*3/uL (ref 1.8–8.0)
Nucleated RBCs: 0 PER 100 WBC
Nucleated RBCs: 0 PER 100 WBC
Platelets: 236 10*3/uL (ref 150–400)
Platelets: 255 10*3/uL (ref 150–400)
RBC: 4.15 M/uL (ref 3.80–5.20)
RBC: 4.32 M/uL (ref 3.80–5.20)
RDW: 12.4 % (ref 11.5–14.5)
RDW: 12.5 % (ref 11.5–14.5)
WBC: 8.5 10*3/uL (ref 3.6–11.0)
WBC: 6.2 10*3/uL (ref 3.6–11.0)

## 2020-09-05 LAB — COMPREHENSIVE METABOLIC PANEL
ALT: 46 U/L (ref 12–78)
ALT: 56 U/L (ref 12–78)
AST: 53 U/L — ABNORMAL HIGH (ref 15–37)
AST: 60 U/L — ABNORMAL HIGH (ref 15–37)
Albumin/Globulin Ratio: 0.6 — ABNORMAL LOW (ref 1.1–2.2)
Albumin/Globulin Ratio: 0.5 — ABNORMAL LOW (ref 1.1–2.2)
Albumin: 2.1 g/dL — ABNORMAL LOW (ref 3.5–5.0)
Albumin: 2.2 g/dL — ABNORMAL LOW (ref 3.5–5.0)
Alkaline Phosphatase: 63 U/L (ref 45–117)
Alkaline Phosphatase: 66 U/L (ref 45–117)
Anion Gap: 6 mmol/L (ref 5–15)
Anion Gap: 5 mmol/L (ref 5–15)
BUN: 9 MG/DL (ref 6–20)
BUN: 9 MG/DL (ref 6–20)
Bun/Cre Ratio: 20 (ref 12–20)
Bun/Cre Ratio: 17 (ref 12–20)
CO2: 28 mmol/L (ref 21–32)
CO2: 28 mmol/L (ref 21–32)
Calcium: 7.5 MG/DL — ABNORMAL LOW (ref 8.5–10.1)
Calcium: 8.1 MG/DL — ABNORMAL LOW (ref 8.5–10.1)
Chloride: 106 mmol/L (ref 97–108)
Chloride: 106 mmol/L (ref 97–108)
Creatinine: 0.44 MG/DL — ABNORMAL LOW (ref 0.55–1.02)
Creatinine: 0.52 MG/DL — ABNORMAL LOW (ref 0.55–1.02)
EGFR IF NonAfrican American: >60 mL/min/{1.73_m2} (ref >60)
EGFR IF NonAfrican American: >60 mL/min/{1.73_m2} (ref >60)
GFR African American: >60 mL/min/{1.73_m2} (ref >60)
GFR African American: >60 mL/min/{1.73_m2} (ref >60)
Globulin: 3.4 g/dL (ref 2.0–4.0)
Globulin: 4.4 g/dL — ABNORMAL HIGH (ref 2.0–4.0)
Glucose: 107 mg/dL — ABNORMAL HIGH (ref 65–100)
Glucose: 148 mg/dL — ABNORMAL HIGH (ref 65–100)
Potassium: 3.7 mmol/L (ref 3.5–5.1)
Potassium: 3.7 mmol/L (ref 3.5–5.1)
Sodium: 140 mmol/L (ref 136–145)
Sodium: 139 mmol/L (ref 136–145)
Total Bilirubin: 0.4 MG/DL (ref 0.2–1.0)
Total Bilirubin: 0.5 MG/DL (ref 0.2–1.0)
Total Protein: 5.5 g/dL — ABNORMAL LOW (ref 6.4–8.2)
Total Protein: 6.6 g/dL (ref 6.4–8.2)

## 2020-09-05 LAB — FERRITIN
Ferritin: 714 NG/ML — ABNORMAL HIGH (ref 8–252)
Ferritin: 714 NG/ML — ABNORMAL HIGH (ref 8–252)

## 2020-09-05 LAB — MAGNESIUM
Magnesium: 2.3 mg/dL (ref 1.6–2.4)
Magnesium: 2.3 mg/dL (ref 1.6–2.4)

## 2020-09-05 LAB — C-REACTIVE PROTEIN
CRP: 2.9 mg/dL — ABNORMAL HIGH (ref 0.00–0.60)
CRP: 2.78 mg/dL — ABNORMAL HIGH (ref 0.00–0.60)

## 2020-09-05 LAB — PROCALCITONIN
Procalcitonin: <0.05 ng/mL
Procalcitonin: <0.05 ng/mL

## 2020-09-05 LAB — D-DIMER, QUANTITATIVE
D-Dimer, Quant: 2.27 mg/L FEU — ABNORMAL HIGH (ref 0.00–0.65)
D-Dimer, Quant: 2.01 mg/L FEU — ABNORMAL HIGH (ref 0.00–0.65)

## 2020-09-05 LAB — PHOSPHORUS
Phosphorus: 1.6 MG/DL — ABNORMAL LOW (ref 2.6–4.7)
Phosphorus: 1.6 MG/DL — ABNORMAL LOW (ref 2.6–4.7)

## 2020-09-05 LAB — METABOLIC PANEL, COMPREHENSIVE
A-G Ratio: 0.6 — ABNORMAL LOW (ref 1.1–2.2)
A-G Ratio: 0.5 — ABNORMAL LOW (ref 1.1–2.2)
ALT (SGPT): 46 U/L (ref 12–78)
ALT (SGPT): 56 U/L (ref 12–78)
AST (SGOT): 53 U/L — ABNORMAL HIGH (ref 15–37)
AST (SGOT): 60 U/L — ABNORMAL HIGH (ref 15–37)
Albumin: 2.1 g/dL — ABNORMAL LOW (ref 3.5–5.0)
Albumin: 2.2 g/dL — ABNORMAL LOW (ref 3.5–5.0)
Alk. phosphatase: 63 U/L (ref 45–117)
Alk. phosphatase: 66 U/L (ref 45–117)
Anion gap: 6 mmol/L (ref 5–15)
Anion gap: 5 mmol/L (ref 5–15)
BUN/Creatinine ratio: 20 (ref 12–20)
BUN/Creatinine ratio: 17 (ref 12–20)
BUN: 9 MG/DL (ref 6–20)
BUN: 9 MG/DL (ref 6–20)
Bilirubin, total: 0.4 MG/DL (ref 0.2–1.0)
Bilirubin, total: 0.5 MG/DL (ref 0.2–1.0)
CO2: 28 mmol/L (ref 21–32)
CO2: 28 mmol/L (ref 21–32)
Calcium: 7.5 MG/DL — ABNORMAL LOW (ref 8.5–10.1)
Calcium: 8.1 MG/DL — ABNORMAL LOW (ref 8.5–10.1)
Chloride: 106 mmol/L (ref 97–108)
Chloride: 106 mmol/L (ref 97–108)
Creatinine: 0.44 MG/DL — ABNORMAL LOW (ref 0.55–1.02)
Creatinine: 0.52 MG/DL — ABNORMAL LOW (ref 0.55–1.02)
GFR est AA: >60 mL/min/{1.73_m2} (ref >60)
GFR est AA: >60 mL/min/{1.73_m2} (ref >60)
GFR est non-AA: >60 mL/min/{1.73_m2} (ref >60)
GFR est non-AA: >60 mL/min/{1.73_m2} (ref >60)
Globulin: 3.4 g/dL (ref 2.0–4.0)
Globulin: 4.4 g/dL — ABNORMAL HIGH (ref 2.0–4.0)
Glucose: 107 mg/dL — ABNORMAL HIGH (ref 65–100)
Glucose: 148 mg/dL — ABNORMAL HIGH (ref 65–100)
Potassium: 3.7 mmol/L (ref 3.5–5.1)
Potassium: 3.7 mmol/L (ref 3.5–5.1)
Protein, total: 5.5 g/dL — ABNORMAL LOW (ref 6.4–8.2)
Protein, total: 6.6 g/dL (ref 6.4–8.2)
Sodium: 140 mmol/L (ref 136–145)
Sodium: 139 mmol/L (ref 136–145)

## 2020-09-05 LAB — CBC WITH AUTOMATED DIFF
ABS. BASOPHILS: 0 10*3/uL (ref 0.0–0.1)
ABS. BASOPHILS: 0 10*3/uL (ref 0.0–0.1)
ABS. EOSINOPHILS: 0 10*3/uL (ref 0.0–0.4)
ABS. EOSINOPHILS: 0 10*3/uL (ref 0.0–0.4)
ABS. IMM. GRANS.: 0.1 10*3/uL — ABNORMAL HIGH (ref 0.00–0.04)
ABS. IMM. GRANS.: 0.1 10*3/uL — ABNORMAL HIGH (ref 0.00–0.04)
ABS. LYMPHOCYTES: 0.8 10*3/uL (ref 0.8–3.5)
ABS. LYMPHOCYTES: 0.6 10*3/uL — ABNORMAL LOW (ref 0.8–3.5)
ABS. MONOCYTES: 0.5 10*3/uL (ref 0.0–1.0)
ABS. MONOCYTES: 0.2 10*3/uL (ref 0.0–1.0)
ABS. NEUTROPHILS: 7.1 10*3/uL (ref 1.8–8.0)
ABS. NEUTROPHILS: 5.3 10*3/uL (ref 1.8–8.0)
ABSOLUTE NRBC: 0 10*3/uL (ref 0.00–0.01)
ABSOLUTE NRBC: 0 10*3/uL (ref 0.00–0.01)
BASOPHILS: 0 % (ref 0–1)
BASOPHILS: 0 % (ref 0–1)
EOSINOPHILS: 0 % (ref 0–7)
EOSINOPHILS: 0 % (ref 0–7)
HCT: 39.1 % (ref 35.0–47.0)
HCT: 39.7 % (ref 35.0–47.0)
HGB: 12.8 g/dL (ref 11.5–16.0)
HGB: 13.2 g/dL (ref 11.5–16.0)
IMMATURE GRANULOCYTES: 1 % — ABNORMAL HIGH (ref 0.0–0.5)
IMMATURE GRANULOCYTES: 1 % — ABNORMAL HIGH (ref 0.0–0.5)
LYMPHOCYTES: 9 % — ABNORMAL LOW (ref 12–49)
LYMPHOCYTES: 9 % — ABNORMAL LOW (ref 12–49)
MCH: 30.8 PG (ref 26.0–34.0)
MCH: 30.6 PG (ref 26.0–34.0)
MCHC: 32.7 g/dL (ref 30.0–36.5)
MCHC: 33.2 g/dL (ref 30.0–36.5)
MCV: 94.2 FL (ref 80.0–99.0)
MCV: 91.9 FL (ref 80.0–99.0)
MONOCYTES: 6 % (ref 5–13)
MONOCYTES: 4 % — ABNORMAL LOW (ref 5–13)
MPV: 10.1 FL (ref 8.9–12.9)
MPV: 9.8 FL (ref 8.9–12.9)
NEUTROPHILS: 84 % — ABNORMAL HIGH (ref 32–75)
NEUTROPHILS: 86 % — ABNORMAL HIGH (ref 32–75)
NRBC: 0 PER 100 WBC
NRBC: 0 PER 100 WBC
PLATELET: 236 10*3/uL (ref 150–400)
PLATELET: 255 10*3/uL (ref 150–400)
RBC: 4.15 M/uL (ref 3.80–5.20)
RBC: 4.32 M/uL (ref 3.80–5.20)
RDW: 12.4 % (ref 11.5–14.5)
RDW: 12.5 % (ref 11.5–14.5)
WBC: 8.5 10*3/uL (ref 3.6–11.0)
WBC: 6.2 10*3/uL (ref 3.6–11.0)

## 2020-09-05 LAB — SAMPLES BEING HELD

## 2020-09-05 LAB — D DIMER
D-dimer: 2.27 mg/L FEU — ABNORMAL HIGH (ref 0.00–0.65)
D-dimer: 2.01 mg/L FEU — ABNORMAL HIGH (ref 0.00–0.65)

## 2020-09-05 LAB — C REACTIVE PROTEIN, QT
C-Reactive protein: 2.9 mg/dL — ABNORMAL HIGH (ref 0.00–0.60)
C-Reactive protein: 2.78 mg/dL — ABNORMAL HIGH (ref 0.00–0.60)

## 2020-09-05 MED ORDER — MELATONIN 5 MG TAB, RAPID DISSOLVE
5 mg | Freq: Every evening | ORAL | Status: DC
Start: 2020-09-05 — End: 2020-09-16
  Administered 2020-09-06 – 2020-09-16 (×10): via ORAL

## 2020-09-05 MED ORDER — ASCORBIC ACID 500 MG TAB
500 mg | Freq: Two times a day (BID) | ORAL | Status: DC
Start: 2020-09-05 — End: 2020-09-16
  Administered 2020-09-05 – 2020-09-16 (×22): via ORAL

## 2020-09-05 MED ORDER — ZINC SULFATE 220 MG (50 MG ELEMENTAL ZINC) CAP
50 mg zinc (220 mg) | Freq: Every day | ORAL | Status: DC
Start: 2020-09-05 — End: 2020-09-16
  Administered 2020-09-06 – 2020-09-16 (×11): via ORAL

## 2020-09-05 MED ORDER — CHOLECALCIFEROL (VITAMIN D3) 1,000 UNIT (25 MCG) TAB
Freq: Every day | ORAL | Status: DC
Start: 2020-09-05 — End: 2020-09-16
  Administered 2020-09-06 – 2020-09-16 (×11): via ORAL

## 2020-09-05 MED FILL — ACETAMINOPHEN 325 MG TABLET: 325 mg | ORAL | Qty: 2

## 2020-09-05 MED FILL — ENOXAPARIN 40 MG/0.4 ML SUB-Q SYRINGE: 40 mg/0.4 mL | SUBCUTANEOUS | Qty: 0.4

## 2020-09-05 MED FILL — OLUMIANT 2 MG TABLET: 2 mg | ORAL | Qty: 2

## 2020-09-05 MED FILL — ASCORBIC ACID 500 MG TAB: 500 mg | ORAL | Qty: 1

## 2020-09-05 MED FILL — DEXAMETHASONE 6 MG TAB: 6 mg | ORAL | Qty: 1

## 2020-09-05 NOTE — Progress Notes (Signed)
Progress  Notes by Glenetta Hew, MD at 09/05/20 1334                Author: Glenetta Hew, MD  Service: Hospitalist  Author Type: Physician       Filed: 09/05/20 1336  Date of Service: 09/05/20 1334  Status: Signed          Editor: Glenetta Hew, MD (Physician)                          [place] [facility]   [address] [place], [place] [address]   [phone]         Medical Progress Note         NAME: Teresa Allen    DOB:  04-08-1976   MRM:  [id number]      Date/Time: 09/05/2020                    Assessment / Plan:        44 y.o. female with morbid obesity otherwise no significant PMH, recent dx of COVID-19 presenting w/ worsening dyspnea, admitted for AHRF 2/2 COVID-19   ??     Acute respiratory failure with hypoxia: 2/2 COVID-19. CTA chest showed no e/o PE. Supplemental O2, currently on 5L NC. Tx as below   ??     Pneumonia due to COVID-19 virus: unvaccinated. Multilobar PNA evident on CT chest, moderately severe. Markers reassuring. Oxygen slightly worse today; only pulling 200cc on IS. High risk given her obesity    - Dex 6mg  11/25    - Baricitinib 11/25    - LMWH ppx    - Supportive care, IS   ??     Elevated liver enzymes: minimal. Follow LFTs, especially on baricitinib    ??     Sepsis: Fever, tachycardia, viral source. Low c/f bacterial component. Low risk for shock. Does not need aggressive fluid resuscitation, gentle as needed   ??     Morbid obesity: Body mass index is 58.53 kg/m??. RF for severe COVID. Would benefit from weight loss and dietary / lifestyle modifications                  Care Plan discussed with: Patient, Care Manager and Nursing Staff      Discussed:  Care Plan      Prophylaxis:  Lovenox      Disposition:  Home w/Family             ___________________________________________________      Attending Physician: 12/25, MD                Subjective:        Chief Complaint:  NAEON,feels about the same today, foggy, tired, weak; feels like she is not  very good with the IS. Doesn't think she can  prone due to back surgery      ROS:   (bold if positive,  if negative)      Tolerating PT  Tolerating Diet                   Objective:             Vitals:               Last 24hrs VS reviewed since prior progress note. Most recent are:      Visit Vitals      BP  (!) 98/52 (BP  1 Location: Left lower arm, BP Patient Position: At rest)     Pulse  78     Temp  99.5 ??F (37.5 ??C)     Resp  20     Ht  5\' 2"  (1.575 m)     Wt  145.2 kg (320 lb)     SpO2  94%        BMI  58.53 kg/m??        SpO2 Readings from Last 6 Encounters:      09/05/20  94%           O2 Flow Rate (L/min): 5 l/min           Intake/Output Summary (Last 24 hours) at 09/05/2020 1334   Last data filed at 09/05/2020 0600     Gross per 24 hour        Intake  480 ml        Output  0 ml        Net  480 ml                 Exam:        Physical Exam:      Gen:  Well-developed, well-nourished, in no acute distress, morbidly obese   HEENT:  Pink conjunctivae, PERRL, hearing intact to voice, moist mucous membranes   Neck:  Supple, without masses, thyroid non-tender   Resp:  No accessory muscle use,   Card:  No murmurs, normal S1, S2 without thrills, bruits or peripheral edema   Abd:  Soft, non-tender, non-distended, normoactive bowel sounds are present   Musc:  No cyanosis or clubbing   Skin:  No rashes or ulcers, skin turgor is good   Neuro:  Cranial nerves 3-12 are grossly intact, grip strength is 5/5 bilaterally and dorsi / plantarflexion is 5/5 bilaterally, follows commands  appropriately   Psych:  Good insight, oriented to person, place and time, alert          Medications Reviewed: (see below)      Lab Data Reviewed: (see below)      ______________________________________________________________________        Medications:          Current Facility-Administered Medications          Medication  Dose  Route  Frequency           ?  sodium chloride (NS) flush 5-10 mL   5-10 mL  IntraVENous  PRN     ?  sodium chloride  (NS) flush 5-40 mL   5-40 mL  IntraVENous  Q8H     ?  sodium chloride (NS) flush 5-40 mL   5-40 mL  IntraVENous  PRN     ?  acetaminophen (TYLENOL) tablet 650 mg   650 mg  Oral  Q6H PRN          Or           ?  acetaminophen (TYLENOL) suppository 650 mg   650 mg  Rectal  Q6H PRN     ?  polyethylene glycol (MIRALAX) packet 17 g   17 g  Oral  DAILY PRN     ?  enoxaparin (LOVENOX) injection 40 mg   40 mg  SubCUTAneous  Q12H     ?  dexAMETHasone (DECADRON) tablet 6 mg   6 mg  Oral  Q24H           ?  baricitinib (OLUMIANT) tablet 4 mg  4 mg  Oral  DAILY                    Lab Review:          Recent Labs            09/05/20   0513  09/03/20   1141     WBC  8.5  4.5     HGB  12.8  14.7     HCT  39.1  44.4         PLT  236  197          Recent Labs            09/05/20   0513  09/03/20   1141     NA  140  138     K  3.7  3.6     CL  106  104     CO2  28  29     GLU  107*  128*     BUN  9  10     CREA  0.44*  0.80     CA  7.5*  7.9*     MG  2.3  2.3     PHOS  1.6*   --      ALB  2.1*  2.6*         ALT  46  44        No components found for: [facility]

## 2020-09-05 NOTE — Progress Notes (Signed)
Bedside shift change report given to Sunny Schlein RN (oncoming nurse) by Stacie Acres RN (offgoing nurse). Report included the following information SBAR, Kardex, MAR and Med Rec Status.

## 2020-09-06 LAB — CBC WITH AUTO DIFFERENTIAL
Band Neutrophils: 3 %
Basophils %: 0 % (ref 0–1)
Basophils Absolute: 0 10*3/uL (ref 0.0–0.1)
Eosinophils %: 0 % (ref 0–7)
Eosinophils Absolute: 0 10*3/uL (ref 0.0–0.4)
Granulocyte Absolute Count: 0 10*3/uL (ref 0.00–0.04)
Hematocrit: 38.4 % (ref 35.0–47.0)
Hemoglobin: 12.3 g/dL (ref 11.5–16.0)
Immature Granulocytes: 0 % (ref 0.0–0.5)
Lymphocytes %: 12 % (ref 12–49)
Lymphocytes Absolute: 0.6 10*3/uL — ABNORMAL LOW (ref 0.8–3.5)
MCH: 29.8 PG (ref 26.0–34.0)
MCHC: 32 g/dL (ref 30.0–36.5)
MCV: 93 FL (ref 80.0–99.0)
MPV: 10 FL (ref 8.9–12.9)
Metamyelocytes Relative: 2 %
Monocytes %: 9 % (ref 5–13)
Monocytes Absolute: 0.5 10*3/uL (ref 0.0–1.0)
NRBC Absolute: 0 10*3/uL (ref 0.00–0.01)
Neutrophils %: 74 % (ref 32–75)
Neutrophils Absolute: 3.9 10*3/uL (ref 1.8–8.0)
Nucleated RBCs: 0 PER 100 WBC
Platelets: 248 10*3/uL (ref 150–400)
RBC: 4.13 M/uL (ref 3.80–5.20)
RDW: 12.4 % (ref 11.5–14.5)
WBC: 5 10*3/uL (ref 3.6–11.0)

## 2020-09-06 LAB — COMPREHENSIVE METABOLIC PANEL
ALT: 66 U/L (ref 12–78)
AST: 50 U/L — ABNORMAL HIGH (ref 15–37)
Albumin/Globulin Ratio: 0.5 — ABNORMAL LOW (ref 1.1–2.2)
Albumin: 2.1 g/dL — ABNORMAL LOW (ref 3.5–5.0)
Alkaline Phosphatase: 60 U/L (ref 45–117)
Anion Gap: 5 mmol/L (ref 5–15)
BUN: 10 MG/DL (ref 6–20)
Bun/Cre Ratio: 20 (ref 12–20)
CO2: 29 mmol/L (ref 21–32)
Calcium: 8.1 MG/DL — ABNORMAL LOW (ref 8.5–10.1)
Chloride: 106 mmol/L (ref 97–108)
Creatinine: 0.49 MG/DL — ABNORMAL LOW (ref 0.55–1.02)
EGFR IF NonAfrican American: >60 mL/min/{1.73_m2} (ref >60)
GFR African American: >60 mL/min/{1.73_m2} (ref >60)
Globulin: 4.2 g/dL — ABNORMAL HIGH (ref 2.0–4.0)
Glucose: 116 mg/dL — ABNORMAL HIGH (ref 65–100)
Potassium: 3.7 mmol/L (ref 3.5–5.1)
Sodium: 140 mmol/L (ref 136–145)
Total Bilirubin: 0.4 MG/DL (ref 0.2–1.0)
Total Protein: 6.3 g/dL — ABNORMAL LOW (ref 6.4–8.2)

## 2020-09-06 LAB — ARTERIAL BLOOD GASES
Base Excess: 3.4 mmol/L
HCO3: 28 mmol/L — ABNORMAL HIGH (ref 22–26)
LITERS PER MINUTE: 15 L/min
O2 Sat: 90 % — ABNORMAL LOW (ref 92–97)
PCO2: 40 mmHg (ref 35–45)
PO2: 56 mmHg — ABNORMAL LOW (ref 80–100)
pH: 7.46 — ABNORMAL HIGH (ref 7.35–7.45)

## 2020-09-06 LAB — EKG 12-LEAD
Atrial Rate: 107 {beats}/min
P Axis: 20 degrees
P-R Interval: 136 ms
Q-T Interval: 300 ms
QRS Duration: 92 ms
QTc Calculation (Bazett): 400 ms
R Axis: -20 degrees
T Axis: 4 degrees
Ventricular Rate: 107 {beats}/min

## 2020-09-06 LAB — PROBNP, N-TERMINAL: BNP: 171 PG/ML — ABNORMAL HIGH (ref <125)

## 2020-09-06 LAB — D-DIMER, QUANTITATIVE: D-Dimer, Quant: 1.81 mg/L FEU — ABNORMAL HIGH (ref 0.00–0.65)

## 2020-09-06 LAB — CREATININE
Creatinine: 0.45 MG/DL — ABNORMAL LOW (ref 0.55–1.02)
Creatinine: 0.45 MG/DL — ABNORMAL LOW (ref 0.55–1.02)
EGFR IF NonAfrican American: >60 mL/min/{1.73_m2} (ref >60)
GFR African American: >60 mL/min/{1.73_m2} (ref >60)
GFR est AA: >60 mL/min/{1.73_m2} (ref >60)
GFR est non-AA: >60 mL/min/{1.73_m2} (ref >60)

## 2020-09-06 LAB — MAGNESIUM
Magnesium: 2.7 mg/dL — ABNORMAL HIGH (ref 1.6–2.4)
Magnesium: 2.7 mg/dL — ABNORMAL HIGH (ref 1.6–2.4)

## 2020-09-06 LAB — FERRITIN
Ferritin: 700 NG/ML — ABNORMAL HIGH (ref 8–252)
Ferritin: 700 NG/ML — ABNORMAL HIGH (ref 8–252)

## 2020-09-06 LAB — C-REACTIVE PROTEIN: CRP: 1.97 mg/dL — ABNORMAL HIGH (ref 0.00–0.60)

## 2020-09-06 LAB — PHOSPHORUS
Phosphorus: 2.5 MG/DL — ABNORMAL LOW (ref 2.6–4.7)
Phosphorus: 2.5 MG/DL — ABNORMAL LOW (ref 2.6–4.7)

## 2020-09-06 LAB — EKG, 12 LEAD, INITIAL
Atrial Rate: 107 {beats}/min
Calculated P Axis: 20 degrees
Calculated R Axis: -20 degrees
Calculated T Axis: 4 degrees
P-R Interval: 136 ms
Q-T Interval: 300 ms
QRS Duration: 92 ms
QTC Calculation (Bezet): 400 ms
Ventricular Rate: 107 {beats}/min

## 2020-09-06 LAB — METABOLIC PANEL, COMPREHENSIVE
A-G Ratio: 0.5 — ABNORMAL LOW (ref 1.1–2.2)
ALT (SGPT): 66 U/L (ref 12–78)
AST (SGOT): 50 U/L — ABNORMAL HIGH (ref 15–37)
Albumin: 2.1 g/dL — ABNORMAL LOW (ref 3.5–5.0)
Alk. phosphatase: 60 U/L (ref 45–117)
Anion gap: 5 mmol/L (ref 5–15)
BUN/Creatinine ratio: 20 (ref 12–20)
BUN: 10 MG/DL (ref 6–20)
Bilirubin, total: 0.4 MG/DL (ref 0.2–1.0)
CO2: 29 mmol/L (ref 21–32)
Calcium: 8.1 MG/DL — ABNORMAL LOW (ref 8.5–10.1)
Chloride: 106 mmol/L (ref 97–108)
Creatinine: 0.49 MG/DL — ABNORMAL LOW (ref 0.55–1.02)
GFR est AA: >60 mL/min/{1.73_m2} (ref >60)
GFR est non-AA: >60 mL/min/{1.73_m2} (ref >60)
Globulin: 4.2 g/dL — ABNORMAL HIGH (ref 2.0–4.0)
Glucose: 116 mg/dL — ABNORMAL HIGH (ref 65–100)
Potassium: 3.7 mmol/L (ref 3.5–5.1)
Protein, total: 6.3 g/dL — ABNORMAL LOW (ref 6.4–8.2)
Sodium: 140 mmol/L (ref 136–145)

## 2020-09-06 LAB — CBC WITH AUTOMATED DIFF
ABS. BASOPHILS: 0 10*3/uL (ref 0.0–0.1)
ABS. EOSINOPHILS: 0 10*3/uL (ref 0.0–0.4)
ABS. IMM. GRANS.: 0 10*3/uL (ref 0.00–0.04)
ABS. LYMPHOCYTES: 0.6 10*3/uL — ABNORMAL LOW (ref 0.8–3.5)
ABS. MONOCYTES: 0.5 10*3/uL (ref 0.0–1.0)
ABS. NEUTROPHILS: 3.9 10*3/uL (ref 1.8–8.0)
ABSOLUTE NRBC: 0 10*3/uL (ref 0.00–0.01)
BAND NEUTROPHILS: 3 %
BASOPHILS: 0 % (ref 0–1)
EOSINOPHILS: 0 % (ref 0–7)
HCT: 38.4 % (ref 35.0–47.0)
HGB: 12.3 g/dL (ref 11.5–16.0)
IMMATURE GRANULOCYTES: 0 % (ref 0.0–0.5)
LYMPHOCYTES: 12 % (ref 12–49)
MCH: 29.8 PG (ref 26.0–34.0)
MCHC: 32 g/dL (ref 30.0–36.5)
MCV: 93 FL (ref 80.0–99.0)
METAMYELOCYTES: 2 %
MONOCYTES: 9 % (ref 5–13)
MPV: 10 FL (ref 8.9–12.9)
NEUTROPHILS: 74 % (ref 32–75)
NRBC: 0 PER 100 WBC
PLATELET: 248 10*3/uL (ref 150–400)
RBC: 4.13 M/uL (ref 3.80–5.20)
RDW: 12.4 % (ref 11.5–14.5)
WBC: 5 10*3/uL (ref 3.6–11.0)

## 2020-09-06 LAB — BLOOD GAS, ARTERIAL
BASE EXCESS: 3.4 mmol/L
BICARBONATE: 28 mmol/L — ABNORMAL HIGH (ref 22–26)
O2 FLOW RATE: 15 L/min
O2 SAT: 90 % — ABNORMAL LOW (ref 92–97)
PCO2: 40 mmHg (ref 35–45)
PO2: 56 mmHg — ABNORMAL LOW (ref 80–100)
pH: 7.46 — ABNORMAL HIGH (ref 7.35–7.45)

## 2020-09-06 LAB — D DIMER: D-dimer: 1.81 mg/L FEU — ABNORMAL HIGH (ref 0.00–0.65)

## 2020-09-06 LAB — NT-PRO BNP: NT pro-BNP: 171 PG/ML — ABNORMAL HIGH (ref <125)

## 2020-09-06 LAB — C REACTIVE PROTEIN, QT: C-Reactive protein: 1.97 mg/dL — ABNORMAL HIGH (ref 0.00–0.60)

## 2020-09-06 MED ORDER — LACTATED RINGERS BOLUS IV
Freq: Once | INTRAVENOUS | Status: AC
Start: 2020-09-06 — End: 2020-09-06
  Administered 2020-09-06: 15:00:00 via INTRAVENOUS

## 2020-09-06 MED ORDER — LOPERAMIDE 2 MG CAP
2 mg | ORAL | Status: DC | PRN
Start: 2020-09-06 — End: 2020-09-16
  Administered 2020-09-06: 20:00:00 via ORAL

## 2020-09-06 MED ORDER — DEXAMETHASONE SODIUM PHOSPHATE (PF) 10 MG/ML INJECTION
10 mg/mL | Freq: Two times a day (BID) | INTRAMUSCULAR | Status: DC
Start: 2020-09-06 — End: 2020-09-09
  Administered 2020-09-06 – 2020-09-09 (×7): via INTRAVENOUS

## 2020-09-06 MED ORDER — DEXAMETHASONE SODIUM PHOSPHATE 4 MG/ML IJ SOLN
4 mg/mL | Freq: Two times a day (BID) | INTRAMUSCULAR | Status: DC
Start: 2020-09-06 — End: 2020-09-06

## 2020-09-06 MED FILL — DEXAMETHASONE 6 MG TAB: 6 mg | ORAL | Qty: 1

## 2020-09-06 MED FILL — OLUMIANT 2 MG TABLET: 2 mg | ORAL | Qty: 2

## 2020-09-06 MED FILL — LOPERAMIDE 2 MG CAP: 2 mg | ORAL | Qty: 1

## 2020-09-06 MED FILL — ACETAMINOPHEN 325 MG TABLET: 325 mg | ORAL | Qty: 2

## 2020-09-06 MED FILL — ENOXAPARIN 40 MG/0.4 ML SUB-Q SYRINGE: 40 mg/0.4 mL | SUBCUTANEOUS | Qty: 0.4

## 2020-09-06 MED FILL — MELATONIN 5 MG TAB, RAPID DISSOLVE: 5 mg | ORAL | Qty: 1

## 2020-09-06 MED FILL — ASCORBIC ACID 500 MG TAB: 500 mg | ORAL | Qty: 1

## 2020-09-06 MED FILL — LACTATED RINGERS IV: INTRAVENOUS | Qty: 1000

## 2020-09-06 MED FILL — ZINC SULFATE 220 MG (50 MG ELEMENTAL ZINC) CAP: 50 mg zinc (220 mg) | ORAL | Qty: 1

## 2020-09-06 MED FILL — CHOLECALCIFEROL (VITAMIN D3) 1,000 UNIT (25 MCG) TAB: ORAL | Qty: 1

## 2020-09-06 MED FILL — DEXAMETHASONE SODIUM PHOSPHATE (PF) 10 MG/ML INJECTION: 10 mg/mL | INTRAMUSCULAR | Qty: 1

## 2020-09-06 NOTE — Progress Notes (Signed)
Progress  Notes by Glenetta Hew, MD at 09/06/20 1255                Author: Glenetta Hew, MD  Service: Hospitalist  Author Type: Physician       Filed: 09/06/20 1305  Date of Service: 09/06/20 1255  Status: Signed          Editor: Glenetta Hew, MD (Physician)                          [place] [facility]   [address] [place], [place] [address]   [phone]         Medical Progress Note         NAME: Teresa Allen    DOB:  Mar 20, 1976   MRM:  [id number]      Date/Time: 09/06/2020                    Assessment / Plan:        44 y.o. female with morbid obesity otherwise no significant PMH, recent dx of COVID-19 presenting w/ worsening dyspnea, admitted for AHRF 2/2 COVID-19   ??   #Acute respiratory failure with hypoxia: 2/2 COVID-19. Unvaccinated. Multilobar PNA evident on CT chest, moderately severe. Markers have been reassuring; however, trajectory has been concerning. Presented requiring 4L nc on 11/25, now needing HFNC at  30L/90% on 10/28. Pulling <300cc on IS, not proning or moving. Obesity a risk factor for severe disease and I voiced to pt and family that I am concerned she will continue to worsen and need mechanical ventilation.     - Increase Dex to 10mg  IV BID 11/28    - Baricitinib 11/25    - LMWH ppx    - Supportive care, IS; add acapella for recruitment    ??   #Elevated liver enzymes: minimal. Follow LFTs, especially on baricitinib    ??   #Sepsis: Fever, tachycardia, viral source. Low c/f bacterial component. Low risk for shock. Does not need aggressive fluid resuscitation, gentle as needed   ??   #Morbid obesity: Body mass index is 58.53 kg/m??. RF for severe COVID. Would benefit from weight loss and dietary / lifestyle modifications                  Care Plan discussed with: Patient, Care Manager and Nursing Staff      Discussed:  Care Plan      Prophylaxis:  Lovenox      Disposition:  Home w/Family             ___________________________________________________       Attending Physician: 12/25, MD                Subjective:        Chief Complaint:  Overnight dest with minimal activity and transferred to stepdown for HFNC. She feels weak, tired, foggy. Minimally participatory      ROS:   (bold if positive,  if negative)      Tolerating PT  Tolerating Diet                   Objective:             Vitals:               Last 24hrs VS reviewed since prior progress note. Most recent are:      Visit Vitals  BP  (!) 100/47     Pulse  (!) 54     Temp  98 ??F (36.7 ??C)     Resp  19     Ht  5\' 2"  (1.575 m)     Wt  145.2 kg (320 lb)     SpO2  98%        BMI  58.53 kg/m??        SpO2 Readings from Last 6 Encounters:      09/06/20  98%           O2 Flow Rate (L/min): 40 l/min           Intake/Output Summary (Last 24 hours) at 09/06/2020 1255   Last data filed at 09/06/2020 1200     Gross per 24 hour        Intake  443.33 ml        Output  0 ml        Net  443.33 ml                 Exam:        Physical Exam:      Gen:  Well-developed, well-nourished, in no acute distress, morbidly obese   HEENT:  Pink conjunctivae, PERRL, hearing intact to voice, moist mucous membranes   Neck:  Supple, without masses, thyroid non-tender   Resp:  No accessory muscle use,   Card:  No murmurs, normal S1, S2 without thrills, bruits or peripheral edema   Abd:  Soft, non-tender, non-distended, normoactive bowel sounds are present   Musc:  No cyanosis or clubbing   Skin:  No rashes or ulcers, skin turgor is good   Neuro:  Cranial nerves 3-12 are grossly intact, grip strength is 5/5 bilaterally and dorsi / plantarflexion is 5/5 bilaterally, follows commands  appropriately   Psych:  Good insight, oriented to person, place and time, alert          Medications Reviewed: (see below)      Lab Data Reviewed: (see below)      ______________________________________________________________________        Medications:          Current Facility-Administered Medications          Medication  Dose  Route  Frequency            ?  lactated ringers bolus infusion 1,000 mL   1,000 mL  IntraVENous  ONCE     ?  dexamethasone (PF) (DECADRON) 10 mg/mL injection 10 mg   10 mg  IntraVENous  Q12H     ?  ascorbic acid (vitamin C) (VITAMIN C) tablet 500 mg   500 mg  Oral  BID     ?  zinc sulfate (ZINCATE) 50 mg zinc (220 mg) capsule 1 Capsule   1 Capsule  Oral  DAILY     ?  cholecalciferol (VITAMIN D3) (1000 Units /25 mcg) tablet 1,000 Units   1,000 Units  Oral  DAILY     ?  melatonin (rapid dissolve) tablet 5 mg   5 mg  Oral  QHS     ?  sodium chloride (NS) flush 5-10 mL   5-10 mL  IntraVENous  PRN     ?  sodium chloride (NS) flush 5-40 mL   5-40 mL  IntraVENous  Q8H     ?  sodium chloride (NS) flush 5-40 mL   5-40 mL  IntraVENous  PRN     ?  acetaminophen (TYLENOL) tablet 650 mg   650 mg  Oral  Q6H PRN          Or           ?  acetaminophen (TYLENOL) suppository 650 mg   650 mg  Rectal  Q6H PRN     ?  polyethylene glycol (MIRALAX) packet 17 g   17 g  Oral  DAILY PRN     ?  enoxaparin (LOVENOX) injection 40 mg   40 mg  SubCUTAneous  Q12H           ?  baricitinib (OLUMIANT) tablet 4 mg   4 mg  Oral  DAILY                    Lab Review:          Recent Labs             09/06/20   0642  09/05/20   1535  09/05/20   0513     WBC  5.0  6.2  8.5     HGB  12.3  13.2  12.8     HCT  38.4  39.7  39.1          PLT  248  255  236          Recent Labs             09/06/20   0642  09/05/20   1535  09/05/20   0513     NA  140  139  140     K  3.7  3.7  3.7     CL  106  106  106     CO2  29  28  28      GLU  116*  148*  107*     BUN  10  9  9      CREA  0.49*   0.45*  0.52*  0.44*     CA  8.1*  8.1*  7.5*     MG  2.7*   --   2.3     PHOS  2.5*   --   1.6*     ALB  2.1*  2.2*  2.1*          ALT  66  56  46        No components found for: [facility]

## 2020-09-06 NOTE — Progress Notes (Signed)
Spiritual Care Assessment/Progress Note  [facility]      NAME: Teresa Allen      MRN: [id number]  AGE: 44 y.o. SEX: female  Religious Affiliation: No preference   Language: English     09/06/2020     Total Time (in minutes): 10     Spiritual Assessment begun in [facility] 3 INTERVNTNL CARE through conversation with:         [] Patient        []  Family    []  Friend(s)        Reason for Consult: Initial visit     Spiritual beliefs: (Please include comment if needed)     []  Identifies with a faith tradition:         []  Supported by a faith community:            []  Claims no spiritual orientation:           []  Seeking spiritual identity:                []  Adheres to an individual form of spirituality:           [x]  Not able to assess:                           Identified resources for coping:      []  Prayer                               []  Music                  []  Guided Imagery     []  Family/friends                 []  Pet visits     []  Devotional reading                         []  Unknown     []  Other:                                             Interventions offered during this visit: (See comments for more details)    Patient Interventions: Initial visit           Plan of Care:     []  Support spiritual and/or cultural needs    []  Support AMD and/or advance care planning process      []  Support grieving process   []  Coordinate Rites and/or Rituals    []  Coordination with community clergy   []  No spiritual needs identified at this time   []  Detailed Plan of Care below (See Comments)  []  Make referral to Music Therapy  []  Make referral to Pet Therapy     []  Make referral to Addiction services  []  Make referral to [facility] Passages  []  Make referral to Spiritual Care Partner  []  No future visits requested        [x]  Contact Spiritual Care for further referrals     Attempted visit for palliative initial spiritual assessment. Unable to visit patient at this time as pt is on Covid isolation precautions. Pt's chart  was consulted.  Clancy Gourd, Chaplain, MDiv, MS, [facility]

## 2020-09-06 NOTE — Progress Notes (Signed)
 BEDSIDE_VERBAL_RECORDED_WRITTEN:[id number]::Bedside} shift change report given to Leita, RN of ICU 6 (oncoming nurse) by Bobbette (offgoing nurse). Report included the following information SBAR, Kardex, ED Summary, Procedure Summary, Intake/Output, MAR, Recent Results and Med Rec Status, cardiac rhythm.    9473 rapid response called after call from remote tele that patient's pulse ox 79%; this dino reported that (woman staff CNA) was taking patient to bathroom (this dino was informed of bathroom information from CNA when this author called via vocera, to CNA for other reason). Upon presenting to 529, this author saw pt seated stabley dangling R side of bed attempt to use BSC w/CNA assist. Pt c/o dizziness, and not feeling well. Soon after, vocera call to this author from CNA that pt with SBP 91; HR 56; pulse ox 78%, at 0525. 0526 rapid response called by this author while this author was at other patient's bedside. Rapid response called to unit secy for patient in room 529; and this author informed CNA to put patient's feet and legs back in bed from dangling, and to elevate patient's legs if tolerable. 0602 phone call to patient's sister Lucienne Centers [phone] when in 529 this author asked pt if there is any one pt wants to be called to notify of patient's then pending transfer to ICU for need of higher flow O2 (higher than O2 6LPM nc). Just prior to 0555, pt transferred in bed to ICU 6, since no stepdown bed available. Remote tele was notified upon arrival of pt to ICU 6 and removal of tele box 16; tele box 16 has been returned to remote tele. All of patient's belongings from 529 were taken to ICU 6 along with patient. Patient's sister was informed upon 0602 call to Veronica/pt's sister, that this is not a life threatening emergency; Veronica identified pt by name, DOB, and relation, before this dino revealed patient ID. Lucienne stated understanding that though patient is alert, talkative and gave  Veronica's contact info to this dino, that patient was placed in ICU 6 for need of higher level of oxygen, and that stepdown bed was not available at the time. Veronica verbalized appreciation for the call.

## 2020-09-06 NOTE — Progress Notes (Signed)
Subjective:  RRT called for patient because of drop in oxygen saturation and increased work of breathing.  Patient was admitted for evaluation and treatment of acute respiratory failure secondary to Covid pneumonia.  CTA of the chest on admission negative for PE but shows multifocal pneumonia.  Patient denies any chest pain.    Objective:  HEENT: Normocephalic, atraumatic.  Chest: Few expiratory wheezing and bilateral basal crackles.  Heart: Normal S1 and S2, regular, no clinically appreciable murmur.  Abdomen: Soft, obese, nontender, normal bowel sounds.  CNS: Alert oriented x3, no gross focal neurological deficit.  Extremities: No edema, pulses 2+ bilaterally.    A/P:  Acute respiratory failure with hypoxia secondary to Covid pneumonia.  We will transfer the patient to ICU for close monitoring, patient is at significant risk for further deterioration and possible intubation.  Blood pressure is borderline another reason for transfer to ICU.  We will consult intensivist to assist in further evaluation and management.  We will check CBC with differential, CMP, BNP level, magnesium and phosphorus and cardiac markers.

## 2020-09-06 NOTE — Progress Notes (Signed)
1900: Verbal shift change report given to [employer] (Cabin crew) by Irving Burton (offgoing nurse). Report included the following information SBAR, ED Summary, Intake/Output, Cardiac Rhythm SB and Quality Measures.      Primary Nurse Enzo Montgomery and Irving Burton, RN performed a dual skin assessment on this patient No impairment noted  Braden score, see flow sheet.     2000: Pt AXO 4, up in chair, S1S2 heard, Diminished breath sounds, Active bowel sounds. Pt on High flow 35L @ 100% stating in the 90's.      2030: Hair care performed on pt     2200: Meds administered, pt asisted to [facility] then to bed. PT destat due to DOE. Highflow pumped up to 60L then titrated down to 40L once settled.     0000: Reassessment, no changes noted. High flow weaned down to 35L @ 90% FIO2    0400: Reassessment, no changes noted. Labs drawn and sent     0700: Verbal shift change report given to [employer] engineer) by Durward Mallard (offgoing nurse). Report included the following information SBAR, ED Summary, Intake/Output, Cardiac Rhythm SB and Quality Measures.

## 2020-09-07 LAB — CREATININE
Creatinine: 0.4 MG/DL — ABNORMAL LOW (ref 0.55–1.02)
Creatinine: 0.4 MG/DL — ABNORMAL LOW (ref 0.55–1.02)
EGFR IF NonAfrican American: >60 mL/min/{1.73_m2} (ref >60)
GFR African American: >60 mL/min/{1.73_m2} (ref >60)
GFR est AA: >60 mL/min/{1.73_m2} (ref >60)
GFR est non-AA: >60 mL/min/{1.73_m2} (ref >60)

## 2020-09-07 MED FILL — ASCORBIC ACID 500 MG TAB: 500 mg | ORAL | Qty: 1

## 2020-09-07 MED FILL — ZINC SULFATE 220 MG (50 MG ELEMENTAL ZINC) CAP: 50 mg zinc (220 mg) | ORAL | Qty: 1

## 2020-09-07 MED FILL — ENOXAPARIN 40 MG/0.4 ML SUB-Q SYRINGE: 40 mg/0.4 mL | SUBCUTANEOUS | Qty: 0.4

## 2020-09-07 MED FILL — CHOLECALCIFEROL (VITAMIN D3) 1,000 UNIT (25 MCG) TAB: ORAL | Qty: 1

## 2020-09-07 MED FILL — MELATONIN 5 MG TAB, RAPID DISSOLVE: 5 mg | ORAL | Qty: 1

## 2020-09-07 MED FILL — DEXAMETHASONE SODIUM PHOSPHATE (PF) 10 MG/ML INJECTION: 10 mg/mL | INTRAMUSCULAR | Qty: 1

## 2020-09-07 MED FILL — OLUMIANT 2 MG TABLET: 2 mg | ORAL | Qty: 2

## 2020-09-07 NOTE — Progress Notes (Signed)
Spiritual Care Assessment/Progress Note  [facility]      NAME: Kynsey Susko      MRN: [id number]  AGE: 44 y.o. SEX: female  Religious Affiliation: No preference   Language: English     09/07/2020     Total Time (in minutes): 60     Spiritual Assessment begun in [facility] 3 INTERVNTNL CARE through conversation with:         [x] Patient        [x]  Family    []  Friend(s)        Reason for Consult: Initial/Spiritual assessment, critical care     Spiritual beliefs: (Please include comment if needed)     [x]  Identifies with a faith tradition: [employer]      []  Supported by a faith community:            []  Claims no spiritual orientation:           []  Seeking spiritual identity:                []  Adheres to an individual form of spirituality:           []  Not able to assess:                           Identified resources for coping:      []  Prayer                               []  Music                  []  Guided Imagery     [x]  Family/friends                 []  Pet visits     []  Devotional reading                         []  Unknown     [x]  Other: Anointing of the Sick                                         Interventions offered during this visit: (See comments for more details)    Patient Interventions: Catharsis/review of pertinent events in supportive environment, Normalization of emotional/spiritual concerns           Plan of Care:     []  Support spiritual and/or cultural needs    []  Support AMD and/or advance care planning process      []  Support grieving process   [x]  Coordinate Rites and/or Rituals    []  Coordination with community clergy   []  No spiritual needs identified at this time   []  Detailed Plan of Care below (See Comments)  []  Make referral to Music Therapy  []  Make referral to Pet Therapy     []  Make referral to Addiction services  []  Make referral to [facility] Passages  []  Make referral to Spiritual Care Partner  []  No future visits requested        [x]  Contact Spiritual Care for further  referrals     Comments: After conducting chart review, Chaplain visited Mrs. Mountford for an initial spiritual assessment in the ICU. Mrs. Bartha is on Covid+ contact precautions and in consulting  with her nurse she shared that Mrs. Duhn would be able to have a conversation with the chaplain via telephone. While on the unit, chaplain called into Mrs. Senteno's room, but the room phone didn't ring out. With assistance from her nurse, chaplain called into Mrs. Afshar's room as second time, introducing herself to Mrs. Labiche and extending support. She sounded a little discouraged on the phone, sharing that she was feeling very tired. 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  Chaplain consulted with the ICU nursing director, who agreed to have Mrs. Tome's brother-in-law visit to provide this ritual for her. He is expected to arrive by 6:30 pm tonight and his nurse is aware. Chaplain inquired how she could best support Mrs. Authier at this time and she expressed no additional needs. She is aware of the chaplain's availability. Chaplain's are available for further support upon referral  Chaplain Serita Grammes. Victoriano Lain, MDiv.    Chaplain Paging Service: [phone])

## 2020-09-07 NOTE — Progress Notes (Signed)
Progress Notes by Melynda Keller, MD at 09/07/20 1756                Author: Melynda Keller, MD  Service: Hospitalist  Author Type: Physician       Filed: 09/07/20 1800  Date of Service: 09/07/20 1756  Status: Signed          Editor: Melynda Keller, MD (Physician)                                                                                   [place] [facility]   [address] [place], [place] [address]   [phone]         Medical Progress Note         NAME:         Teresa Allen    DOB:        [birth year]   MRM:        [id number]      Date of service: 09/07/2020        Subjective: Patient has been seen and examined as a follow up for multiple medical issues. Chart, labs, diagnostics reviewed. She remains  weak. Mildly SOB. No fever or chills.       Objective:      Vital Signs:      Visit Vitals      BP  (!) 108/54 (BP 1 Location: Right upper arm, BP Patient Position: At rest)        Pulse  (!) 53     Temp  98.1 ??F (36.7 ??C)     Resp  24     Ht  5\' 2"  (1.575 m)     Wt  145.2 kg (320 lb)     SpO2  95%        BMI  58.53 kg/m??              Intake/Output Summary (Last 24 hours) at 09/07/2020 1756   Last data filed at 09/07/2020 0400     Gross per 24 hour        Intake  200 ml        Output  --        Net  200 ml            Physical Examination:      General:   Weak and ill looking, not in any acute distress    Eyes:   pink conjunctivae, PERRLA with no discharge.   ENT:   no ottorrhea or rhinorrhea with dry mucous membranes   Neck: no masses, thyroid non-tender and trachea central.   Pulm: + accessory muscle use, decreased breath sounds with scattered crackles. No wheezes   Card:  no JVD or murmurs, has regular and normal S1, S2 without thrills, bruits or peripheral edema   Abd:  Soft, non-tender, non-distended, normoactive bowel sounds with no palpable organomegaly   Musc:  No cyanosis, clubbing, atrophy or deformities.   Skin:  No rashes, bruising or ulcers.    Neuro: Awake  and alert. Generally a non focal exam. Follows commands appropriately   Psych:  Has a  good insight and is oriented x 3        Current Facility-Administered Medications          Medication  Dose  Route  Frequency           ?  dexamethasone (PF) (DECADRON) 10 mg/mL injection 10 mg   10 mg  IntraVENous  Q12H     ?  loperamide (IMODIUM) capsule 2 mg   2 mg  Oral  Q4H PRN     ?  ascorbic acid (vitamin C) (VITAMIN C) tablet 500 mg   500 mg  Oral  BID     ?  zinc sulfate (ZINCATE) 50 mg zinc (220 mg) capsule 1 Capsule   1 Capsule  Oral  DAILY     ?  cholecalciferol (VITAMIN D3) (1000 Units /25 mcg) tablet 1,000 Units   1,000 Units  Oral  DAILY     ?  melatonin (rapid dissolve) tablet 5 mg   5 mg  Oral  QHS     ?  sodium chloride (NS) flush 5-10 mL   5-10 mL  IntraVENous  PRN     ?  sodium chloride (NS) flush 5-40 mL   5-40 mL  IntraVENous  Q8H     ?  sodium chloride (NS) flush 5-40 mL   5-40 mL  IntraVENous  PRN     ?  acetaminophen (TYLENOL) tablet 650 mg   650 mg  Oral  Q6H PRN          Or           ?  acetaminophen (TYLENOL) suppository 650 mg   650 mg  Rectal  Q6H PRN     ?  polyethylene glycol (MIRALAX) packet 17 g   17 g  Oral  DAILY PRN     ?  enoxaparin (LOVENOX) injection 40 mg   40 mg  SubCUTAneous  Q12H           ?  baricitinib (OLUMIANT) tablet 4 mg   4 mg  Oral  DAILY            Laboratory data and review:        Recent Labs             09/06/20   0642  09/05/20   1535  09/05/20   0513     WBC  5.0  6.2  8.5     HGB  12.3  13.2  12.8     HCT  38.4  39.7  39.1          PLT  248  255  236          Recent Labs               09/07/20   0512  09/06/20   0642  09/05/20   1535  09/05/20   0513  09/05/20   0513     NA   --   140  139   --   140     K   --   3.7  3.7   --   3.7     CL   --   106  106   --   106     CO2   --   29  28   --   28     GLU   --   116*  148*   --   107*  BUN   --   10  9   --   9     CREA  0.40*  0.49*   0.45*  0.52*    < >  0.44*     CA   --   8.1*  8.1*   --   7.5*     MG   --   2.7*    --    --   2.3     PHOS   --   2.5*   --    --   1.6*     ALB   --   2.1*  2.2*   --   2.1*     ALT   --   66  56   --   46        < > = values in this interval not displayed.        No components found for: [facility]      Diagnostics: Imaging studies have been reviewed      Telemetry reviewed by me:   normal sinus rhythm      Assessment and Plan:      Acute respiratory failure with hypoxia (HCC) (09/03/2020) POA: due to Co-V-19 viral infection. Still on 35L/90% supplemental oxygen. Continue to monitor clinical progress.       Pneumonia due to COVID-19 virus (09/03/2020) / Sepsis POA: she is unvaccinated and now with severe disease. CTA neg for PE but multifocal disease. Inflammatory markers trending down. Continue Dexamethasone, Baricitinib and enoxaparin. Incetive spirometry,  Ambulate as tolerated      Elevated liver enzymes (09/03/2020) POA: stable. Monitor       Morbid obesity:??Body mass index is 58.53 kg/m??.??RF for severe COVID.??Would benefit from weight loss and dietary / lifestyle modifications   ??   Total time spent for the patient's care: 35 Minutes                    Care Plan discussed with: Patient, Care Manager and Nursing Staff      Discussed:  Care Plan and D/C Planning      Prophylaxis:  Lovenox      Anticipated Disposition:  Home w/Family             ___________________________________________________      Attending Physician:   Melynda Keller, MD

## 2020-09-07 NOTE — Progress Notes (Signed)
 TRANSFER - IN REPORT:    Verbal report received  Dawna Furnace being transferred to ICU for urgent transfer   Report consisted of patient's Situation, Background, Assessment and   Recommendations(SBAR).        RUR 8% low   Is This a Readmission NO  Is this a Bundle NO    1.Pt was transferred to ICU yesterday in a RRT due to increased WOB and increased respiratory distress.  2.Covid+  3. NOT vaccinated  4.morbid obesity  5.sepsis  6.acute respiratory failure with hypoxia  7.Medicaid pending  8.Pt is o high flow oxygen 35 liters/100% Fio2    Elizabeth Aristov,RN

## 2020-09-07 NOTE — Progress Notes (Signed)
 Problem: Self Care Deficits Care Plan (Adult)  Goal: *Therapy Goal (Edit Goal, Insert Text)  Description: FUNCTIONAL STATUS PRIOR TO ADMISSION: Patient was independent and active without use of DME. NO supplemental O2 use    HOME SUPPORT: The patient lived with her children.    Occupational Therapy  Initiated 09/07/2020  Target for all goals: SPO2 >88% on </= 6L/min via nasal cannula  1.  Patient will perform grooming in stand with modified independence within 7 day(s).  2.  Patient will perform bathing with modified independence within 7 day(s).  3.  Patient will perform lower body dressing with modified independence within 7 day(s).  4.  Patient will perform toilet transfers with modified independence within 7 day(s).  5.  Patient will perform all aspects of toileting with modified independence within 7 day(s).  6.  Patient will participate in upper extremity therapeutic exercise/activities with modified independence for 10 minutes within 7 day(s).    7.  Patient will utilize energy conservation techniques during functional activities with verbal cues within 7 day(s).        Outcome: Not Met   OCCUPATIONAL THERAPY EVALUATION  Patient: Teresa Allen (44 y.o. female)  Date: 09/07/2020  Primary Diagnosis: COVID-19 [U07.1]        Precautions:        ASSESSMENT  Based on the objective data described below, the patient presents with decline in ADL performance due to significant DOE with minimal activity (simply standing <1 min). Patient is reliant on 40L hiflow, FIO2 100%. Patient is resistant to more activity due to coughing and breathless feeling in her chest. Patient educated on briefly, and provided with handout, on energy conservation strategies, seated UE/LE exercises. Plan for continued instruction in exercise program.     Current Level of Function Impacting Discharge (ADLs/self-care): up to max assistance for LB ADL and toileting     Functional Outcome Measure:  The patient scored Total: 55/100 on the  Barthel Index outcome measure which is indicative of being partially dependent in basic self-care.       Other factors to consider for discharge: patient cares for 4 children     Patient will benefit from skilled therapy intervention to address the above noted impairments.       PLAN :  Recommendations and Planned Interventions: self care training, functional mobility training, therapeutic exercise, balance training, therapeutic activities, endurance activities, patient education, home safety training, and family training/education    Frequency/Duration: Patient will be followed by occupational therapy 3 times a week to address goals.    Recommendation for discharge: (in order for the patient to meet his/her long term goals)  To be determined: pending cardiopulmonary progress    This discharge recommendation:  Has been made in collaboration with the attending provider and/or case management    IF patient discharges home will need the following DME: TBD       SUBJECTIVE:   Patient pleasant and cooperative     OBJECTIVE DATA SUMMARY:   HISTORY:   No past medical history on file.  Past Surgical History:   Procedure Laterality Date   . HX LUMBAR FUSION         Expanded or extensive additional review of patient history:     Home Situation  Home Environment: Private residence  # Steps to Enter: 3  Rails to Enter: No  One/Two Story Residence: One story  Living Alone: No (has 4 children)  Support Systems: Other Family Member(s) (sisters)  Patient Expects to be  Discharged to:: House  Current DME Used/Available at Home: None  Tub or Shower Type: Tub/Shower combination    Hand dominance: Right    EXAMINATION OF PERFORMANCE DEFICITS:  Cognitive/Behavioral Status:  Neurologic State: Alert  Orientation Level: Oriented X4  Cognition: Follows commands; Appropriate decision making                Range of Motion:    AROM: Within functional limits                         Strength:    Strength: Within functional limits                 Coordination:  Coordination: Within functional limits  Fine Motor Skills-Upper: Left Intact; Right Intact    Gross Motor Skills-Upper: Left Intact; Right Intact    Tone & Sensation:    Tone: Normal  Sensation: Intact                      Balance:  Sitting: Intact; High guard  Standing: Impaired; Without support  Standing - Static: Fair  Standing - Dynamic : Fair    Functional Mobility and Transfers for ADLs:  Bed Mobility:  Rolling:  (already up on the chair)    Transfers:  Sit to Stand: Contact guard assistance  Stand to Sit: Contact guard assistance  Bed to Chair: Contact guard assistance    ADL Assessment:  Feeding: Independent    Oral Facial Hygiene/Grooming: Setup (seated)    Bathing: Maximum assistance    Upper Body Dressing: Minimum assistance    Lower Body Dressing: Maximum assistance    Toileting: Maximum assistance                ADL Intervention and task modifications:     Patient was educated on the benefits of maintaining activity tolerance, functional mobility, and independence with self care tasks during acute stay. Encouraged patient to be out of bed for all meals, perform daily ADLs (as approved by RN/MD regarding bathing etc), performing functional mobility to/from toilet with assistance, and increasing time OOB daily. Patient educated about the importance of maintaining activity tolerance to ensure safe return home and to baseline. Patient verbalized understanding of education.             Therapeutic Exercise:  Patient educated on briefly, and provided with handout, on energy conservation strategies, seated UE/LE exercises   Functional Measure:    Barthel Index:  Bathing: 0  Bladder: 10  Bowels: 10  Grooming: 5  Dressing: 5  Feeding: 10  Mobility: 0  Stairs: 0  Toilet Use: 5  Transfer (Bed to Chair and Back): 10  Total: 55/100      The Barthel ADL Index: Guidelines  1. The index should be used as a record of what a patient does, not as a record of what a patient could do.  2. The main aim is  to establish degree of independence from any help, physical or verbal, however minor and for whatever reason.  3. The need for supervision renders the patient not independent.  4. A patient's performance should be established using the best available evidence. Asking the patient, friends/relatives and nurses are the usual sources, but direct observation and common sense are also important. However direct testing is not needed.  5. Usually the patient's performance over the preceding 24-48 hours is important, but occasionally longer periods will be relevant.  6. Middle  categories imply that the patient supplies over 50 per cent of the effort.  7. Use of aids to be independent is allowed.    Score Interpretation (from Sinoff 1997)   80-100 Independent   60-79 Minimally independent   40-59 Partially dependent   20-39 Very dependent   <20 Totally dependent     -Mahoney, F.l., Barthel, D.W. (1965). Functional evaluation: the Barthel Index. [place] [address] Med J (14)2.  -Sinoff, G., Ore, L. (1997). The Barthel activities of daily living index: self-reporting versus actual performance in the old (> or = 75 years). Journal of American Geriatric Society 45(7), [phone].   -Fleeta cotton [place], J.J.M.F, Orman ROES., Oneita MERYL Sebastian DELORSE. (1999). Measuring the change in disability after inpatient rehabilitation; comparison of the responsiveness of the Barthel Index and Functional Independence Measure. Journal of Neurology, Neurosurgery, and Psychiatry, 66(4), [phone].  Marea Chillington, N.J.A, Scholte op [place],  W.J.M, & Koopmanschap, M.A. (2004) Assessment of post-stroke quality of life in cost-effectiveness studies: The usefulness of the Barthel Index and the EuroQoL-5D. Quality of Life Research, 87, 572-56     Occupational Therapy Evaluation Charge Determination   History Examination Decision-Making   LOW Complexity : Brief history review  LOW Complexity : 1-3 performance deficits relating to physical, cognitive , or psychosocial  skils that result in activity limitations and / or participation restrictions  LOW Complexity : No comorbidities that affect functional and no verbal or physical assistance needed to complete eval tasks       Based on the above components, the patient evaluation is determined to be of the following complexity level: LOW   Pain Rating:  none    Activity Tolerance:   Poor and desaturates with exertion and requires oxygen- 40L heated hiflow, FIO2 100%    After treatment patient left in no apparent distress:    Sitting in chair and Call bell within reach    COMMUNICATION/EDUCATION:   The patient's plan of care was discussed with: Physical therapist and Registered nurse.     Home safety education was provided and the patient/caregiver indicated understanding. and Patient/family have participated as able in goal setting and plan of care.    This patient's plan of care is appropriate for delegation to OTA.    Thank you for this referral.  Devere Neighbors, OT  Time Calculation: 26 mins

## 2020-09-07 NOTE — Progress Notes (Signed)
Bedside shift change report given to Ardine Eng (oncoming nurse) by Phoebe Sharps, RN (offgoing nurse). Report included the following information SBAR, Kardex and Cardiac Rhythm Sinus Huston Foley   .

## 2020-09-07 NOTE — Progress Notes (Signed)
1900 Bedside shift change report given to Lynita Lombard RN  (oncoming nurse) by Marty Heck RN  (offgoing nurse). Report included the following information SBAR, Kardex, Recent Results, Cardiac Rhythm sinus brady and Alarm Parameters .    Shift Summary: Patient had uneventful shift, she remained in up in chair til 0200 patient was assisted back to bed, patient is attempting to lay on side. Oxygen demands weaning down patient is now 35 liters 60% and tolerating well    0700 Bedside and Verbal shift change report given to Lynita Lombard RN  (oncoming nurse) by Lynita Lombard RN  (offgoing nurse). Report included the following information SBAR, Kardex, Procedure Summary, Recent Results, Cardiac Rhythm sinus brady and Pre Procedure Checklist.

## 2020-09-07 NOTE — Progress Notes (Signed)
 Problem: Mobility Impaired (Adult and Pediatric)  Goal: *Acute Goals and Plan of Care (Insert Text)  Description: FUNCTIONAL STATUS PRIOR TO ADMISSION: Patient was independent and active without use of DME.    HOME SUPPORT PRIOR TO ADMISSION: The patient lived with family but did not require assist.    Physical Therapy Goals  Initiated 09/07/2020  1.  Patient will move from supine to sit and sit to supine , scoot up and down, and roll side to side in bed with independence within 7 day(s).    2.  Patient will transfer from bed to chair and chair to bed with independence using the least restrictive device within 7 day(s).  3.  Patient will perform sit to stand with independence within 7 day(s).  4.  Patient will ambulate with independence for 200 feet with the least restrictive device within 7 day(s).   5.  Patient will ascend/descend 3 stairs with  handrail(s) with modified independence within 7 day(s).     Outcome: Progressing Towards Goal   PHYSICAL THERAPY EVALUATION  Patient: Teresa Allen (44 y.o. female)  Date: 09/07/2020  Primary Diagnosis: COVID-19 [U07.1]        Precautions: covid +       ASSESSMENT  Based on the objective data described below, the patient presents with generalized weakness and debility. Communicated with nurse cleared for therapy. Patient already up on the chair when received on HF 35 liters 100% FiO2. Saturation 95%. Reviewed purse lip breathing and energy conservation with the patient verbalized understanding. Sit to stand CGA, ambulate 1 to 2 steps limited distance due to short high flow tubing. Assisted back to the chair OOB to chair as tolerated performed some active range of motion exercise on both LE all planes. Patient desaturated after coughing asked to just sit back down. Oxygen saturation drop to 87% but goes up quickly to above 93% with purse lip breathing. Educate and instructed patient to continue active range of motion exercise on both legs while up on chair or on bed  multiple times. Recommend patient to be up on the chair at least 3 times a day every meal times as tolerated. Communicated with nurse who agreed to monitor patient.    Current Level of Function Impacting Discharge (mobility/balance): CGA with tranfers and ambulation    Functional Outcome Measure:  The patient scored 55/100 on the barthel index outcome measure .      Other factors to consider for discharge: fall, poor endurance     Patient will benefit from skilled therapy intervention to address the above noted impairments.       PLAN :  Recommendations and Planned Interventions: bed mobility training, transfer training, gait training, therapeutic exercises, neuromuscular re-education, orthotic/prosthetic training, patient and family training/education and therapeutic activities      Frequency/Duration: Patient will be followed by physical therapy:  5 times a week to address goals.    Recommendation for discharge: (in order for the patient to meet his/her long term goals)  Physical therapy at least 2 days/week in the home     This discharge recommendation:  Has been made in collaboration with the attending provider and/or case management    IF patient discharges home will need the following DME: patient owns DME required for discharge         SUBJECTIVE:   Patient stated "ok."    OBJECTIVE DATA SUMMARY:   HISTORY:    No past medical history on file.  Past Surgical History:   Procedure  Laterality Date   . HX LUMBAR FUSION         Personal factors and/or comorbidities impacting plan of care:     Home Situation  Home Environment: Private residence  # Steps to Enter: 3  Rails to Enter: No  One/Two Story Residence: One story  Living Alone: No (has 4 children)  Support Systems: Other Family Member(s) (sisters)  Patient Expects to be Discharged to:: House  Current DME Used/Available at Home: None  Tub or Shower Type: Tub/Shower combination    EXAMINATION/PRESENTATION/DECISION MAKING:   Critical Behavior:  Neurologic State:  Alert  Orientation Level: Oriented X4  Cognition: Follows commands, Appropriate decision making     Hearing:       Range Of Motion:  AROM: Within functional limits                       Strength:    Strength: Within functional limits                    Tone & Sensation:   Tone: Normal              Sensation: Intact               Coordination:  Coordination: Within functional limits  Vision:      Functional Mobility:  Bed Mobility:  Rolling:  (already up on the chair)           Transfers:  Sit to Stand: Contact guard assistance  Stand to Sit: Contact guard assistance  Stand Pivot Transfers: Contact guard assistance     Bed to Chair: Contact guard assistance              Balance:   Sitting: Intact; High guard  Standing: Impaired; Without support  Standing - Static: Fair  Standing - Dynamic : Fair  Ambulation/Gait Training:  Distance (ft): 2 Feet (ft)     Ambulation - Level of Assistance: Contact guard assistance     Gait Description (WDL): Exceptions to WDL  Gait Abnormalities: Path deviations; Step to gait        Base of Support: Widened     Speed/Cadence: Pace decreased (<100 feet/min)                          Therapeutic Exercises:   Educate and instructed patient to continue active range of motion exercise on both legs while up on chair or on bed multiple times. Recommend patient to be up on the chair at least 3 times a day every meal times as tolerated.          Functional Measure:  Barthel Index:    Bathing: 0  Bladder: 10  Bowels: 10  Grooming: 5  Dressing: 5  Feeding: 10  Mobility: 0  Stairs: 0  Toilet Use: 5  Transfer (Bed to Chair and Back): 10  Total: 55/100       The Barthel ADL Index: Guidelines  1. The index should be used as a record of what a patient does, not as a record of what a patient could do.  2. The main aim is to establish degree of independence from any help, physical or verbal, however minor and for whatever reason.  3. The need for supervision renders the patient not independent.  4. A  patient's performance should be established using the best available evidence. Asking the patient, friends/relatives and nurses are the usual  sources, but direct observation and common sense are also important. However direct testing is not needed.  5. Usually the patient's performance over the preceding 24-48 hours is important, but occasionally longer periods will be relevant.  6. Middle categories imply that the patient supplies over 50 per cent of the effort.  7. Use of aids to be independent is allowed.    Score Interpretation (from Sinoff 1997)   80-100 Independent   60-79 Minimally independent   40-59 Partially dependent   20-39 Very dependent   <20 Totally dependent     -Mahoney, F.l., Barthel, D.W. (1965). Functional evaluation: the Barthel Index. [place] [address] Med J (14)2.  -Sinoff, G., Ore, L. (1997). The Barthel activities of daily living index: self-reporting versus actual performance in the old (> or = 75 years). Journal of American Geriatric Society 45(7), [phone].   -Fleeta cotton [place], J.J.M.F, Orman ROES., Oneita MERYL Sebastian DELORSE. (1999). Measuring the change in disability after inpatient rehabilitation; comparison of the responsiveness of the Barthel Index and Functional Independence Measure. Journal of Neurology, Neurosurgery, and Psychiatry, 66(4), [phone].  Marea Chillington, N.J.A, Scholte op [place],  W.J.M, & Koopmanschap, M.A. (2004) Assessment of post-stroke quality of life in cost-effectiveness studies: The usefulness of the Barthel Index and the EuroQoL-5D. Quality of Life Research, 66, 572-56          Physical Therapy Evaluation Charge Determination   History Examination Presentation Decision-Making   LOW Complexity : Zero comorbidities / personal factors that will impact the outcome / POC LOW Complexity : 1-2 Standardized tests and measures addressing body structure, function, activity limitation and / or participation in recreation  LOW Complexity : Stable, uncomplicated  Other outcome measures  barthel  LOW       Based on the above components, the patient evaluation is determined to be of the following complexity level: LOW     Pain Rating:  2/10    Activity Tolerance:   Good    After treatment patient left in no apparent distress:   Sitting in chair, Heels elevated for pressure relief, Call bell within reach and Bed / chair alarm activated    COMMUNICATION/EDUCATION:   The patient's plan of care was discussed with: Occupational therapist and Registered nurse.     Fall prevention education was provided and the patient/caregiver indicated understanding.    Thank you for this referral.  Lyle FORBES Mallory Sheela, PT,WCC.   Time Calculation: 30 mins

## 2020-09-08 LAB — CREATININE
Creatinine: 0.44 MG/DL — ABNORMAL LOW (ref 0.55–1.02)
Creatinine: 0.44 MG/DL — ABNORMAL LOW (ref 0.55–1.02)
EGFR IF NonAfrican American: >60 mL/min/{1.73_m2} (ref >60)
GFR African American: >60 mL/min/{1.73_m2} (ref >60)
GFR est AA: >60 mL/min/{1.73_m2} (ref >60)
GFR est non-AA: >60 mL/min/{1.73_m2} (ref >60)

## 2020-09-08 MED ORDER — BENZONATATE 100 MG CAP
100 mg | Freq: Three times a day (TID) | ORAL | Status: DC | PRN
Start: 2020-09-08 — End: 2020-09-16
  Administered 2020-09-08 – 2020-09-12 (×2): via ORAL

## 2020-09-08 MED FILL — ZINC SULFATE 220 MG (50 MG ELEMENTAL ZINC) CAP: 50 mg zinc (220 mg) | ORAL | Qty: 1

## 2020-09-08 MED FILL — ENOXAPARIN 40 MG/0.4 ML SUB-Q SYRINGE: 40 mg/0.4 mL | SUBCUTANEOUS | Qty: 0.4

## 2020-09-08 MED FILL — DEXAMETHASONE SODIUM PHOSPHATE (PF) 10 MG/ML INJECTION: 10 mg/mL | INTRAMUSCULAR | Qty: 1

## 2020-09-08 MED FILL — OLUMIANT 2 MG TABLET: 2 mg | ORAL | Qty: 2

## 2020-09-08 MED FILL — ASCORBIC ACID 500 MG TAB: 500 mg | ORAL | Qty: 1

## 2020-09-08 MED FILL — BENZONATATE 100 MG CAP: 100 mg | ORAL | Qty: 1

## 2020-09-08 MED FILL — MELATONIN 5 MG TAB, RAPID DISSOLVE: 5 mg | ORAL | Qty: 1

## 2020-09-08 MED FILL — CHOLECALCIFEROL (VITAMIN D3) 1,000 UNIT (25 MCG) TAB: ORAL | Qty: 2

## 2020-09-08 NOTE — Progress Notes (Signed)
Progress Notes by Melynda Keller, MD at 09/08/20 1328                Author: Melynda Keller, MD  Service: Hospitalist  Author Type: Physician       Filed: 09/08/20 1330  Date of Service: 09/08/20 1328  Status: Signed          Editor: Melynda Keller, MD (Physician)                                                                                   [facility] [facility]   [address] [place], [place] [address]   [phone]         Medical Progress Note         NAME:         Teresa Allen    DOB:        [birth year]   MRM:        [id number]      Date of service: 09/08/2020        Subjective: Patient has been seen and examined as a follow up for multiple medical issues. Chart, labs, diagnostics reviewed. She remains weak.  Mildly cough. Exertional SOB. No nausea or vomiting.        Objective:      Vital Signs:      Visit Vitals      BP  (!) 105/52        Pulse  (!) 48     Temp  98.6 ??F (37 ??C)     Resp  18     Ht  5\' 2"  (1.575 m)     Wt  158.8 kg (350 lb)     SpO2  90%        BMI  64.02 kg/m??              Intake/Output Summary (Last 24 hours) at 09/08/2020 1328   Last data filed at 09/08/2020 0400     Gross per 24 hour        Intake  120 ml        Output  450 ml        Net  -330 ml            Physical Examination:      General:   Weak and ill looking, not in any acute distress    Eyes:   pink conjunctivae, PERRLA with no discharge.   ENT:   no ottorrhea or rhinorrhea with dry mucous membranes   Neck: no masses, thyroid non-tender and trachea central.   Pulm: + accessory muscle use, decreased breath sounds with scattered crackles. No wheezes   Card:  has regular and normal S1, S2 without thrills, bruits or peripheral edema   Abd:  Soft, non-tender, non-distended, normoactive bowel sounds    Musc:  No cyanosis, clubbing, atrophy or deformities.   Skin:  No rashes, bruising or ulcers.    Neuro: Awake and alert. Generally a non focal exam. Follows commands appropriately   Psych:  Has a  good insight and is oriented x 3  Current Facility-Administered Medications          Medication  Dose  Route  Frequency           ?  dexamethasone (PF) (DECADRON) 10 mg/mL injection 10 mg   10 mg  IntraVENous  Q12H     ?  loperamide (IMODIUM) capsule 2 mg   2 mg  Oral  Q4H PRN     ?  ascorbic acid (vitamin C) (VITAMIN C) tablet 500 mg   500 mg  Oral  BID     ?  zinc sulfate (ZINCATE) 50 mg zinc (220 mg) capsule 1 Capsule   1 Capsule  Oral  DAILY     ?  cholecalciferol (VITAMIN D3) (1000 Units /25 mcg) tablet 1,000 Units   1,000 Units  Oral  DAILY     ?  melatonin (rapid dissolve) tablet 5 mg   5 mg  Oral  QHS     ?  sodium chloride (NS) flush 5-10 mL   5-10 mL  IntraVENous  PRN     ?  sodium chloride (NS) flush 5-40 mL   5-40 mL  IntraVENous  Q8H     ?  sodium chloride (NS) flush 5-40 mL   5-40 mL  IntraVENous  PRN     ?  acetaminophen (TYLENOL) tablet 650 mg   650 mg  Oral  Q6H PRN          Or           ?  acetaminophen (TYLENOL) suppository 650 mg   650 mg  Rectal  Q6H PRN     ?  polyethylene glycol (MIRALAX) packet 17 g   17 g  Oral  DAILY PRN     ?  enoxaparin (LOVENOX) injection 40 mg   40 mg  SubCUTAneous  Q12H           ?  baricitinib (OLUMIANT) tablet 4 mg   4 mg  Oral  DAILY            Laboratory data and review:        Recent Labs            09/06/20   0642  09/05/20   1535     WBC  5.0  6.2     HGB  12.3  13.2     HCT  38.4  39.7         PLT  248  255          Recent Labs               09/08/20   0613  09/07/20   0512  09/06/20   0642  09/05/20   1535  09/05/20   1535     NA   --    --   140   --   139     K   --    --   3.7   --   3.7     CL   --    --   106   --   106     CO2   --    --   29   --   28     GLU   --    --   116*   --   148*     BUN   --    --   10   --   9  CREA  0.44*  0.40*  0.49*   0.45*    < >  0.52*     CA   --    --   8.1*   --   8.1*     MG   --    --   2.7*   --    --      PHOS   --    --   2.5*   --    --      ALB   --    --   2.1*   --   2.2*     ALT   --    --   66    --   56        < > = values in this interval not displayed.        No components found for: [facility]      Diagnostics: Imaging studies have been reviewed      Telemetry reviewed by me:   normal sinus rhythm      Assessment and Plan:      Acute respiratory failure with hypoxia (HCC) (09/03/2020) POA: due to Co-V-19 viral infection. Now on 45L/90% supplemental oxygen. Exertional dyspnea. Continue to monitor clinical progress.       Pneumonia due to COVID-19 virus (09/03/2020) / Sepsis POA: she is unvaccinated and now with recovering severe disease. CTA neg for PE but multifocal disease. Inflammatory markers trending down. Continue Dexamethasone, Baricitinib and enoxaparin. Incetive  spirometry, continue to ambulate as tolerated      Elevated liver enzymes (09/03/2020) POA: stable. Monitor       Morbid obesity:??Body mass index is 58.53 kg/m??.??RF for severe COVID.??Would benefit from weight loss and dietary / lifestyle modifications   ??   Total time spent for the patient's care: 30  Minutes                    Care Plan discussed with: Patient, Care Manager and Nursing Staff      Discussed:  Care Plan and D/C Planning      Prophylaxis:  Lovenox      Anticipated Disposition:  Home w/Family             ___________________________________________________      Attending Physician:   Melynda Keller, MD

## 2020-09-08 NOTE — Progress Notes (Signed)
Problem: Self Care Deficits Care Plan (Adult)  Goal: *Therapy Goal (Edit Goal, Insert Text)  Description: FUNCTIONAL STATUS PRIOR TO ADMISSION: Patient was independent and active without use of DME. NO supplemental O2 use    HOME SUPPORT: The patient lived with her children.    Occupational Therapy  Initiated 09/07/2020  Target for all goals: SPO2 >88% on </= 6L/min via nasal cannula  1.  Patient will perform grooming in stand with modified independence within 7 day(s).  2.  Patient will perform bathing with modified independence within 7 day(s).  3.  Patient will perform lower body dressing with modified independence within 7 day(s).  4.  Patient will perform toilet transfers with modified independence within 7 day(s).  5.  Patient will perform all aspects of toileting with modified independence within 7 day(s).  6.  Patient will participate in upper extremity therapeutic exercise/activities with modified independence for 10 minutes within 7 day(s).    7.  Patient will utilize energy conservation techniques during functional activities with verbal cues within 7 day(s).      Outcome: Progressing Towards Goal    OCCUPATIONAL THERAPY TREATMENT  Patient: Teresa Allen (44 y.o. female)  Date: 09/08/2020  Diagnosis: COVID-19 [U07.1]   <principal problem not specified>       Precautions:    Chart, occupational therapy assessment, plan of care, and goals were reviewed.    ASSESSMENT  Patient continues with skilled OT services and is progressing towards goals.  Continues to be limited by decreased activity tolerance, toileting limited by fatigue, decreased ability to reach peri-area and decreased balance. Instructed on UE exercises with coordinated breathing and need to increase activity throughout day as tolerated.     Current Level of Function Impacting Discharge (ADLs): total assist toileting, min assist sit to stand and stand pivot transfer    Other factors to consider for discharge:          PLAN :  Patient  continues to benefit from skilled intervention to address the above impairments.  Continue treatment per established plan of care to address goals.    Recommend with staff: encourage participation in ADL tasks    Recommend next OT session: UE exercises seated unsupported    Recommendation for discharge: (in order for the patient to meet his/her long term goals)  To be determined: pending pulmonary improvement    This discharge recommendation:  Has not yet been discussed the attending provider and/or case management    IF patient discharges home will need the following DME:        SUBJECTIVE:   Patient stated ???it's hard to do that.??? re: breathing    OBJECTIVE DATA SUMMARY:   Cognitive/Behavioral Status:  Neurologic State: Alert; Appropriate for age; Eyes open spontaneously  Orientation Level: Appropriate for age; Oriented X4  Cognition: Appropriate decision making; Appropriate for age attention/concentration; Appropriate safety awareness; Follows commands             Functional Mobility and Transfers for ADLs:  Bed Mobility:  Supine to Sit: Additional time; Stand-by assistance; Bed Modified (HOB elevated)  Scooting: Contact guard assistance    Transfers:  Sit to Stand: Minimum assistance; Additional time; Assist x1  Functional Transfers  Toilet Transfer : Minimum assistance; Assist x1; Additional time (hand held assist)  Bed to Chair: Contact guard assistance    Balance:  Sitting: Intact; With support  Standing: Impaired; Without support  Standing - Static: Fair  Standing - Dynamic : Fair    ADL Intervention:  Re-ed on pursed lip breathing and incorporation with activity    Instructed in modified prone with pillows and issued 2 additional pillows, instructed on alignment to prevent increased back pain     Toileting  Toileting Assistance: Total assistance(dependent)  Bladder Hygiene: Total assistance (dependent); Compensatory technique training  Bowel Hygiene: Total assistance (dependent)         Therapeutic  Exercises:   Educated on incorporation of exercises throughout day, educated on benefit of continued activity more frequently for less time  Instructed to perform bilateral UE AROM exercises with coordinated breathing initially sitting supported in the chair, next sitting unsupported and then in standing      Pain:  No complaint    Activity Tolerance:   Poor, requires frequent rest breaks, observed SOB with activity, and on 35 liters heated Hi flow initially, increased to 45 liters with activity    After treatment patient left in no apparent distress:   Sitting in chair and Call bell within reach    COMMUNICATION/COLLABORATION:   The patient???s plan of care was discussed with: Physical therapist and Registered nurse.     Rosezella [place] Benjamyn Hestand, OTR/L  Time Calculation: 25 mins

## 2020-09-08 NOTE — Progress Notes (Signed)
Reason for Admission:  COVID-19              RUR Score: 7% LOW         COVID-19 Vaccination Status:  Pt states she is not vaccinated- when asked why pt states she has not had the time to get vaccinated with work.     Plan for utilizing home health: PT recommended [facility] services but pt has no insurance. Pending progression CM will reach out to [facility] to see if they can provide some charity visits if absolutely needed.     PCP: Pt has no PCP- has not seen a PCP since moving here two years ago. Pt agreeable for CM to provide her with the Sondra Barges PCP list and Care Card application. CM will provide in discharge paperwork due to COVID-19 precautions.     Current Advanced Directive/Advance Care Plan: Full Code- no AMD on file. Pt is adamant that she wishes for her brother in law Geoff to be MPOA- states he brought up General POA paperwork and she has them in the room but cannot complete it because they need to be notarized. CM advised her of the COVID-19 protocol and requested brother reach out to a traveling notary and see if they can arrange to have one completed via zoom.    CM did offer to complete MPOA paperwork with pt as she remains on 45L O2- pt declined stating she does not wish to complete it at this time. CM advised pt of the [facility] hierarchy pt is formally divorced, has no children over the age of 52 but both parents are surviving. Pt very adamant that her parents are not to make any decisions on her behalf. CM reiterated the importance of MPOA/General Paperwork- pt states she will call her brother now and make arrangements to get this completed ASAP.     Healthcare Decision Maker:  Contacts updated to reflect information obtained. Pt wishes to have her brother in law primary decision maker and sister secondary if needed.                  Transition of Care Plan:                    Pt is a 44 year old, caucasian female, admitted with COVID-19. Pt was AOX4 when speaking with CM, confirming address, emergency  contact and PCP (none). Pt lives with her 4 kids (17, 23, 42 and 37 y/o) in a one level home, 3 steps to enter. Pt also has her sister and brother in law down the road and they are very close. Pt has no DME and drives at baseline- also works full time as an Airline pilot. Pt has not had HH or been to any SNF/IPR in the past. Pt states [facility]- has not seen a provider in 2 years so she she does not have any day to day medications. Pt's family will drive her home at time of discharge and as needed.     CM noted pt is listed as Medicaid Pending- pt states she has not applied for Medicaid before and will be getting insurance starting January as she started a new job. CM inquired about providing pt with the care card- pt agreeable. CM notified CM Supervisor who is reaching out to [employer] to correct pt's account file as it is listed as Medicaid Pending.     Pt remains in ICU- COVID-19 positive on 45L O2. Pending progression during  hospitalization stay pt may need rehab prior to d/c- options limited due to no insurance but may be able to get a charity rehab bed at [facility] if pulmonary rehab or inpatient rehab is recommended. Prior to d/c CM will need to place Sondra Barges PCP list and Care Card application in pt's discharge paperwork. Pt states no further questions or concerns at this time- floor CM will continue to follow and assist as needed.     Care Management Interventions  PCP Verified by CM: Yes  Mode of Transport at Discharge: Self  Transition of Care Consult (CM Consult): Discharge Planning  MyChart Signup: No  Discharge Durable Medical Equipment: No  Physical Therapy Consult: Yes  Occupational Therapy Consult: Yes  Speech Therapy Consult: No  Support Systems: Child(ren), Other Family Member(s)  Confirm Follow Up Transport: Self  The Patient and/or Patient Representative was Provided with a Choice of Provider and Agrees with the Discharge Plan?: Yes  Freedom of Choice List was Provided with Basic  Dialogue that Supports the Patient's Individualized Plan of Care/Goals, Treatment Preferences and Shares the Quality Data Associated with the Providers?: Yes  Discharge Location  Discharge Placement: Home with family assistance     Hyman Bower, MSW, CM   [facility]

## 2020-09-08 NOTE — Progress Notes (Signed)
 Nutrition Assessment     Type and Reason for Visit: Initial, RD nutrition re-screen/LOS    Nutrition Recommendations/Plan:   Continue Regular Diet   Add snacks/ONS to meet calorie needs     Nutrition Assessment:     58/11: 44 year old female admitted for COVID-19 [U07.1] who is morbidly obese and otherwise,  has no past medical history on file. RD called into patient's room. She reports decreased appetite. Had some diarrhea since getting COVID which was resolved with imodium. She has a menu to order meals & is not on any diet restrictions. Pt has not lost her sense of taste/smell.      Malnutrition Assessment:  Malnutrition Status: No malnutrition     Estimated Daily Nutrient Needs:  Energy (kcal):  2640 kcal/d (MSJ xAF 1.2)  Protein (g):  100 g (2 g/kg of IBW)       Fluid (ml/day):  (15 mL/kg of current wt)    Nutrition Related Findings:  Last BM: 11/29; ABD: intact, obese, semi-soft      Current Nutrition Therapies:  ADULT DIET Regular    Documented Meal intake:  Patient Vitals for the past 168 hrs:   % Diet Eaten   09/05/20 0939 1 - 25%   09/04/20 1747 76 - 100%   09/04/20 1248 1 - 25%     Documentation of supplement intake:  No data found.    Anthropometric Measures:   Height:  5' 2 (157.5 cm)   Current Body Wt:  158.8 kg (350 lb)   BMI: 64     Wt Readings from Last 6 Encounters:   09/07/20 158.8 kg (350 lb)     Nutrition Diagnosis:    Inadequate protein-energy intake related to catabolic illness, early satiety, impaired respiratory function as evidenced by intake 26-50%, poor intake prior to admission, hospital food, decreased appetite, +COVID-19.      Nutrition Intervention:  Food and/or Nutrient Delivery: Continue current diet, Snacks (specify)  Nutrition Education and Counseling: Education initiated  Coordination of Nutrition Care: Continue to monitor while inpatient    Goals:  tolerance and acceptance of 75% of meals & snacks offered over the next 6-7 days       Nutrition Monitoring and  Evaluation:   Behavioral-Environmental Outcomes: Knowledge or skill  Food/Nutrient Intake Outcomes: Food and nutrient intake  Physical Signs/Symptoms Outcomes: GI status, Weight, Meal time behavior    Discharge Planning:    No discharge needs at this time     Electronically signed by Delphine CHRISTELLA Dy, RD, MS on 09/08/2020 at 10:56 AM  Contact Number: office [phone] or via Perfect Serve

## 2020-09-08 NOTE — Progress Notes (Signed)
 Problem: Mobility Impaired (Adult and Pediatric)  Goal: *Acute Goals and Plan of Care (Insert Text)  Description: FUNCTIONAL STATUS PRIOR TO ADMISSION: Patient was independent and active without use of DME.    HOME SUPPORT PRIOR TO ADMISSION: The patient lived with family but did not require assist.    Physical Therapy Goals  Initiated 09/07/2020  1.  Patient will move from supine to sit and sit to supine , scoot up and down, and roll side to side in bed with independence within 7 day(s).    2.  Patient will transfer from bed to chair and chair to bed with independence using the least restrictive device within 7 day(s).  3.  Patient will perform sit to stand with independence within 7 day(s).  4.  Patient will ambulate with independence for 200 feet with the least restrictive device within 7 day(s).   5.  Patient will ascend/descend 3 stairs with  handrail(s) with modified independence within 7 day(s).     Outcome: Progressing Towards Goal   PHYSICAL THERAPY TREATMENT  Patient: Teresa Allen (44 y.o. female)  Date: 09/08/2020  Diagnosis: COVID-19 [U07.1]   <principal problem not specified>       Precautions:    Chart, physical therapy assessment, plan of care and goals were reviewed.    ASSESSMENT  Patient continues with skilled PT services and is progressing towards goals. Patient this afternoon with only fair tolerance to physical therapy treatment and poor activity tolerance. Patient requires significant increased time to complete ADLs today. She transfers to EOB and reports need for bedside commode. After several minutes, patient ambulates with CGA 21ft to bedside commode. Patient reporting SOB with this and SpO2 between 84-89%. After several minutes with SpO2 remaining in this range and pursed lip breathing instructed, oxygen titrated up to 35, 40 & ultimately 45 L hi-flow heated NC at 50% FiO2 to maintain oxygen >88%. Patient ambulates additional 58ft with same level assist to bedside chair and requires  totalA for perianal hygiene.  She desaturates following this to 86%, but within 5 minutes recovers to 89%.     Current Level of Function Impacting Discharge (mobility/balance): CGA    Other factors to consider for discharge: none additional         PLAN :  Patient continues to benefit from skilled intervention to address the above impairments.  Continue treatment per established plan of care.  to address goals.    Recommendation for discharge: (in order for the patient to meet his/her long term goals)  Physical therapy at least 2 days/week in the home     This discharge recommendation:  Has not yet been discussed the attending provider and/or case management    IF patient discharges home will need the following DME: to be determined (TBD)       SUBJECTIVE:   Patient stated "I need to take my time, I can't just walk anymore." re: patient expressing need for additional time for ADLs    OBJECTIVE DATA SUMMARY:   Patient received supine in bed and was agreeable to participate in PT session. Patient was cleared by nursing to participate in PT session.     Critical Behavior:  Neurologic State: Alert, Appropriate for age, Eyes open spontaneously  Orientation Level: Appropriate for age, Oriented X4  Cognition: Appropriate decision making, Appropriate for age attention/concentration, Appropriate safety awareness, Follows commands     Functional Mobility Training:  Bed Mobility:     Supine to Sit: Additional time; Stand-by assistance; Bed Modified (  HOB elevated)     Scooting: Contact guard assistance        Transfers:  Sit to Stand: Contact guard assistance  Stand to Sit: Contact guard assistance        Bed to Chair: Contact guard assistance                    Balance:  Sitting: Intact; With support  Standing: Impaired; Without support  Standing - Static: Fair  Standing - Dynamic : Fair  Ambulation/Gait Training:  Distance (ft): 5 Feet (ft) (+5)     Ambulation - Level of Assistance: Contact guard assistance        Gait  Abnormalities: Trunk sway increased; Step to gait        Base of Support: Widened     Speed/Cadence: Pace decreased (<100 feet/min)                     Therapeutic Exercises:   Reviewed pursed lip breathing   Pain Rating:  Patient without reports of pain during therapy      Activity Tolerance:   Poor, desaturates with exertion and requires oxygen, and requires frequent rest breaks    After treatment patient left in no apparent distress:   Sitting in chair, Heels elevated for pressure relief, Call bell within reach, and Bed / chair alarm activated    COMMUNICATION/COLLABORATION:   The patient's plan of care was discussed with: Occupational therapist and Registered nurse.     Graig Pore PT, DPT   Time Calculation: 32 mins

## 2020-09-08 NOTE — Progress Notes (Signed)
 1900: Bedside and Verbal shift change report given to Paola/Jordan, RN (oncoming nurse) by Lauraine, RN (offgoing nurse). Report included the following information SBAR, Kardex, MAR, Recent Results and Cardiac Rhythm SB.    2000: Assessment completed, see doc flow sheet. Pt up in chair and encouraged to turn and reposition self.     Neuro: A&O x4, PERRLA    Cardio: S1S2 present, bradycardia    Respiratory: Pt is on HiFlow 35L on 50% FIO2 w/sats @ 95. Bilateral lung sounds are course and diminished.     GI/GU: Pt is up w/assistance to the bedside commode.     Skin: Intact.    TRANSFER - OUT REPORT:    Verbal report given to Darryle, RN (name) on Geryl Dohn  being transferred to [facility] (unit) for change in patient condition(stepdown)       Report consisted of patient's Situation, Background, Assessment and   Recommendations(SBAR).     Information from the following report(s) SBAR, MAR, Recent Results and Cardiac Rhythm SB was reviewed with the receiving nurse.    Lines:   Peripheral IV 09/03/20 Anterior; Left; Proximal Forearm (Active)   Site Assessment Clean, dry, & intact 09/08/20 2000   Phlebitis Assessment 0 09/08/20 2000   Infiltration Assessment 0 09/08/20 2000   Dressing Status Clean, dry, & intact 09/08/20 2000   Dressing Type Transparent 09/08/20 2000   Hub Color/Line Status Pink; Capped 09/08/20 2000   Action Taken Open ports on tubing capped 09/08/20 2000   Alcohol Cap Used Yes 09/08/20 2000        Opportunity for questions and clarification was provided.      Patient transported with:   Monitor  O2 @ 35 liters  Tech

## 2020-09-08 NOTE — Progress Notes (Signed)
0700- Bedside and Verbal shift change report given to Sarah Control and instrumentation engineer) by Britt Boozer (offgoing nurse). Report included the following information SBAR, Kardex, Procedure Summary, Intake/Output, MAR, Med Rec Status and Alarm Parameters .       1900- Bedside and Verbal shift change report given to [place] Control and instrumentation engineer) by Maralyn Sago (offgoing nurse). Report included the following information SBAR, Kardex, Procedure Summary, Accordion, Recent Results and Cardiac Rhythm sinus rhythm.

## 2020-09-09 ENCOUNTER — Inpatient Hospital Stay: Payer: BLUE CROSS/BLUE SHIELD | Primary: Family Medicine

## 2020-09-09 LAB — COMPREHENSIVE METABOLIC PANEL
ALT: 144 U/L — ABNORMAL HIGH (ref 12–78)
AST: 69 U/L — ABNORMAL HIGH (ref 15–37)
Albumin/Globulin Ratio: 0.6 — ABNORMAL LOW (ref 1.1–2.2)
Albumin: 2.1 g/dL — ABNORMAL LOW (ref 3.5–5.0)
Alkaline Phosphatase: 61 U/L (ref 45–117)
Anion Gap: 7 mmol/L (ref 5–15)
BUN: 13 MG/DL (ref 6–20)
Bun/Cre Ratio: 22 — ABNORMAL HIGH (ref 12–20)
CO2: 27 mmol/L (ref 21–32)
Calcium: 8.2 MG/DL — ABNORMAL LOW (ref 8.5–10.1)
Chloride: 108 mmol/L (ref 97–108)
Creatinine: 0.58 MG/DL (ref 0.55–1.02)
EGFR IF NonAfrican American: >60 mL/min/{1.73_m2} (ref >60)
GFR African American: >60 mL/min/{1.73_m2} (ref >60)
Globulin: 3.5 g/dL (ref 2.0–4.0)
Glucose: 149 mg/dL — ABNORMAL HIGH (ref 65–100)
Potassium: 4.4 mmol/L (ref 3.5–5.1)
Sodium: 142 mmol/L (ref 136–145)
Total Bilirubin: 0.3 MG/DL (ref 0.2–1.0)
Total Protein: 5.6 g/dL — ABNORMAL LOW (ref 6.4–8.2)

## 2020-09-09 LAB — EKG 12-LEAD
Atrial Rate: 39 {beats}/min
P Axis: 8 degrees
P-R Interval: 144 ms
Q-T Interval: 484 ms
QRS Duration: 100 ms
QTc Calculation (Bazett): 389 ms
R Axis: -7 degrees
T Axis: 15 degrees
Ventricular Rate: 39 {beats}/min

## 2020-09-09 LAB — METABOLIC PANEL, COMPREHENSIVE
A-G Ratio: 0.6 — ABNORMAL LOW (ref 1.1–2.2)
ALT (SGPT): 144 U/L — ABNORMAL HIGH (ref 12–78)
AST (SGOT): 69 U/L — ABNORMAL HIGH (ref 15–37)
Albumin: 2.1 g/dL — ABNORMAL LOW (ref 3.5–5.0)
Alk. phosphatase: 61 U/L (ref 45–117)
Anion gap: 7 mmol/L (ref 5–15)
BUN/Creatinine ratio: 22 — ABNORMAL HIGH (ref 12–20)
BUN: 13 MG/DL (ref 6–20)
Bilirubin, total: 0.3 MG/DL (ref 0.2–1.0)
CO2: 27 mmol/L (ref 21–32)
Calcium: 8.2 MG/DL — ABNORMAL LOW (ref 8.5–10.1)
Chloride: 108 mmol/L (ref 97–108)
Creatinine: 0.58 MG/DL (ref 0.55–1.02)
GFR est AA: >60 mL/min/{1.73_m2} (ref >60)
GFR est non-AA: >60 mL/min/{1.73_m2} (ref >60)
Globulin: 3.5 g/dL (ref 2.0–4.0)
Glucose: 149 mg/dL — ABNORMAL HIGH (ref 65–100)
Potassium: 4.4 mmol/L (ref 3.5–5.1)
Protein, total: 5.6 g/dL — ABNORMAL LOW (ref 6.4–8.2)
Sodium: 142 mmol/L (ref 136–145)

## 2020-09-09 LAB — EKG, 12 LEAD, INITIAL
Atrial Rate: 39 {beats}/min
Calculated P Axis: 8 degrees
Calculated R Axis: -7 degrees
Calculated T Axis: 15 degrees
P-R Interval: 144 ms
Q-T Interval: 484 ms
QRS Duration: 100 ms
QTC Calculation (Bezet): 389 ms
Ventricular Rate: 39 {beats}/min

## 2020-09-09 LAB — SAMPLES BEING HELD

## 2020-09-09 MED ORDER — SODIUM CHLORIDE 0.9% BOLUS IV
0.9 % | Freq: Once | INTRAVENOUS | Status: AC
Start: 2020-09-09 — End: 2020-09-09
  Administered 2020-09-09: 21:00:00 via INTRAVENOUS

## 2020-09-09 MED ORDER — HYDROCORTISONE SOD SUCCINATE (PF) 100 MG/2 ML SOLUTION FOR INJECTION
100 mg/2 mL | Freq: Four times a day (QID) | INTRAMUSCULAR | Status: AC
Start: 2020-09-09 — End: 2020-09-09
  Administered 2020-09-09 – 2020-09-10 (×4): via INTRAVENOUS

## 2020-09-09 MED ORDER — HYDROCORTISONE SOD SUCCINATE (PF) 100 MG/2 ML SOLUTION FOR INJECTION
100 mg/2 mL | Freq: Four times a day (QID) | INTRAMUSCULAR | Status: DC
Start: 2020-09-09 — End: 2020-09-09

## 2020-09-09 MED ORDER — MIDODRINE 5 MG TAB
5 mg | Freq: Three times a day (TID) | ORAL | Status: DC
Start: 2020-09-09 — End: 2020-09-16
  Administered 2020-09-09 – 2020-09-16 (×23): via ORAL

## 2020-09-09 MED ORDER — DEXAMETHASONE SODIUM PHOSPHATE (PF) 10 MG/ML INJECTION
10 mg/mL | Freq: Two times a day (BID) | INTRAMUSCULAR | Status: DC
Start: 2020-09-09 — End: 2020-09-10

## 2020-09-09 MED ORDER — SODIUM CHLORIDE 0.9% BOLUS IV
0.9 % | Freq: Once | INTRAVENOUS | Status: AC
Start: 2020-09-09 — End: 2020-09-09
  Administered 2020-09-09: 14:00:00 via INTRAVENOUS

## 2020-09-09 MED FILL — ASCORBIC ACID 500 MG TAB: 500 mg | ORAL | Qty: 1

## 2020-09-09 MED FILL — MIDODRINE 5 MG TAB: 5 mg | ORAL | Qty: 2

## 2020-09-09 MED FILL — ZINC SULFATE 220 MG (50 MG ELEMENTAL ZINC) CAP: 50 mg zinc (220 mg) | ORAL | Qty: 1

## 2020-09-09 MED FILL — SOLU-CORTEF ACT-O-VIAL (PF) 100 MG/2 ML SOLUTION FOR INJECTION: 100 mg/2 mL | INTRAMUSCULAR | Qty: 2

## 2020-09-09 MED FILL — ENOXAPARIN 40 MG/0.4 ML SUB-Q SYRINGE: 40 mg/0.4 mL | SUBCUTANEOUS | Qty: 0.4

## 2020-09-09 MED FILL — DEXAMETHASONE SODIUM PHOSPHATE (PF) 10 MG/ML INJECTION: 10 mg/mL | INTRAMUSCULAR | Qty: 1

## 2020-09-09 MED FILL — SODIUM CHLORIDE 0.9 % IV: INTRAVENOUS | Qty: 500

## 2020-09-09 MED FILL — CHOLECALCIFEROL (VITAMIN D3) 1,000 UNIT (25 MCG) TAB: ORAL | Qty: 1

## 2020-09-09 MED FILL — MELATONIN 5 MG TAB, RAPID DISSOLVE: 5 mg | ORAL | Qty: 1

## 2020-09-09 MED FILL — OLUMIANT 2 MG TABLET: 2 mg | ORAL | Qty: 2

## 2020-09-09 NOTE — Progress Notes (Signed)
2050    Patient noted to have SBP in 80s and MAP in 50s upon assessment. Her HR is between 40s-60s. The patient remains asymptomatic. MD was paged Via perfectserve to develop a plan for the patients BP control overnight. See flowsheet for current BP. At this time Dr. Nolon Bussing would like to continue to monitor the patient and keep him abreast if she remains consistently with MAP <60 or becomes symptomatic.     2309   The patient has consistently remained with low BP and MAP. Current BP is 87/36 MAP 53 she is asymptomatic. I just assisted her to the bathroom and back to bed. During ambulation the patient HR increased to 60s, she denies dizziness upon standing and while walking.  Dr. Nolon Bussing was paged again to address parameters and interventions for this patients blood pressure. Awaiting response at this time    0000    MD paged again regarding low BP and MAP. Patient remains asymptomatic. On call MD Nolon Bussing has placed a order for midodrine and has set parameters for her BP. Will reassess pressure and contact MD if she becomes symptomatic or has two consecutive pressures with MAP < 55. Midodrine has been adminsitered per order.     2831    MD was page regarding concern for hypotension. BP at this time is 86/38 MAP 49. The patient denies any symptoms to include, dizziness and CP. She ambulated to the [facility] with my assistance and denied dizziness. Voiding appropriately. Will await response from MD Nolon Bussing

## 2020-09-09 NOTE — ACP (Advance Care Planning) (Signed)
 Advance Care Planning   Advance Care Planning Inpatient Note  [facility]  Spiritual Care Department    Today's Date: 09/09/2020  Unit: SF[facility] 3 PROG CARE TELE 2    Received request from in-basket request. Upon review of chart and communication with care team, Spiritual Care will defer Advance Care planning with patient at this time. Patient was/were present in the room during visit.    Goals of ACP Conversation:  Discuss Advance Care planning documents    Health Care Decision Makers:    No healthcare decision makers have been documented.   Click here to complete Clinical research associate of the Healthcare Decision Maker Relationship (ie Primary)    Summary:  No Decision Maker named by patient at this time    Advance Care Planning Documents (Patient Wishes) on file:  None     Assessment:    Chaplain responded to an in-basket request to assist Teresa Allen with an Therapist, music (AMD) on the Prog Care Tele unit. Mrs. Frieze is on Covid+ contact precautions. Consulted with her nurse who agreed to take the AMD forms into Mrs. Cadet's room when able. She also noted Mrs. Mishkin would be able to have a conversation with the chaplain via telephone, but chaplain should probably wait until tomorrow to call into the room as Mrs. Caruthers didn't get much rest last night. Chaplain's are available for further support upon referral     Interventions:  Deferred conversation as patient not interested in completing an advance directive at this time    Care Preferences Communicated:  No    Outcomes/Plan:  Deferred conversation at this time    Allean Moons, Chaplain on 09/09/2020 at 10:58 AM

## 2020-09-09 NOTE — Progress Notes (Signed)
Progress Notes by Melynda Keller, MD at 09/09/20 [phone]                Author: Melynda Keller, MD  Service: Hospitalist  Author Type: Physician       Filed: 09/09/20 0938  Date of Service: 09/09/20 0933  Status: Signed          Editor: Melynda Keller, MD (Physician)                                                                                    [facility]   [address] [place] [place], [place] [address]   [phone]         Medical Progress Note         NAME:         Teresa Allen    DOB:        [birth year]   MRM:        [id number]      Date of service: 09/09/2020        Subjective: Patient has been seen and examined as a follow up for multiple medical issues. Chart, labs, diagnostics reviewed. She remains weak.  Was hypotensive last night but BP a little better after a fluid bolus. Remains bradycardic but without symptoms. No fever        Objective:      Vital Signs:      Visit Vitals      BP  (!) 114/51        Pulse  (!) 36     Temp  97.7 ??F (36.5 ??C)     Resp  20     Ht  5\' 2"  (1.575 m)     Wt  (!) 159 kg (350 lb 9.6 oz)     SpO2  95%        BMI  64.13 kg/m??              Intake/Output Summary (Last 24 hours) at 09/09/2020 0933   Last data filed at 09/09/2020 0545     Gross per 24 hour        Intake  340 ml        Output  0 ml        Net  340 ml            Physical Examination:      General:   Weak and ill looking but not in any acute distress     Eyes:   pink conjunctivae, PERRLA with no discharge.   ENT:   no ottorrhea or rhinorrhea with dry mucous membranes   Neck: no masses, thyroid non-tender and trachea central.   Pulm: + accessory muscle use, decreased breath sounds with scattered crackles. No wheezes   Card:  has regular and normal S1, S2 without thrills, bruits or peripheral edema   Abd:  Soft, non-tender, non-distended, normoactive bowel sounds    Musc:  No cyanosis, clubbing, atrophy or deformities.   Skin:  No rashes, bruising or ulcers.    Neuro: Awake and  alert. Generally a non focal exam.    Psych:  Has  a good insight and is oriented x 3        Current Facility-Administered Medications          Medication  Dose  Route  Frequency           ?  hydrocortisone Sod Succ (PF) (SOLU-CORTEF) injection 50 mg   50 mg  IntraVENous  Q6H     ?  midodrine (PROAMATINE) tablet 10 mg   10 mg  Oral  TID WITH MEALS     ?  benzonatate (TESSALON) capsule 100 mg   100 mg  Oral  TID PRN     ?  dexamethasone (PF) (DECADRON) 10 mg/mL injection 10 mg   10 mg  IntraVENous  Q12H     ?  loperamide (IMODIUM) capsule 2 mg   2 mg  Oral  Q4H PRN     ?  ascorbic acid (vitamin C) (VITAMIN C) tablet 500 mg   500 mg  Oral  BID     ?  zinc sulfate (ZINCATE) 50 mg zinc (220 mg) capsule 1 Capsule   1 Capsule  Oral  DAILY     ?  cholecalciferol (VITAMIN D3) (1000 Units /25 mcg) tablet 1,000 Units   1,000 Units  Oral  DAILY     ?  melatonin (rapid dissolve) tablet 5 mg   5 mg  Oral  QHS     ?  sodium chloride (NS) flush 5-10 mL   5-10 mL  IntraVENous  PRN     ?  sodium chloride (NS) flush 5-40 mL   5-40 mL  IntraVENous  Q8H     ?  sodium chloride (NS) flush 5-40 mL   5-40 mL  IntraVENous  PRN     ?  acetaminophen (TYLENOL) tablet 650 mg   650 mg  Oral  Q6H PRN          Or           ?  acetaminophen (TYLENOL) suppository 650 mg   650 mg  Rectal  Q6H PRN     ?  polyethylene glycol (MIRALAX) packet 17 g   17 g  Oral  DAILY PRN           ?  enoxaparin (LOVENOX) injection 40 mg   40 mg  SubCUTAneous  Q12H           ?  baricitinib (OLUMIANT) tablet 4 mg   4 mg  Oral  DAILY            Laboratory data and review:      No results for input(s): WBC, HGB, HCT, PLT, HGBEXT, HCTEXT, PLTEXT, HGBEXT, HCTEXT, PLTEXT in the last 72 hours.     Recent Labs             09/09/20   0257  09/08/20   0613  09/07/20   0512     NA  142   --    --      K  4.4   --    --      CL  108   --    --      CO2  27   --    --      GLU  149*   --    --      BUN  13   --    --      CREA  0.58  0.44*  0.40*  CA  8.2*   --    --      ALB  2.1*    --    --           ALT  144*   --    --         No components found for: [facility]      Diagnostics: Imaging studies have been reviewed      Telemetry reviewed by me:   normal sinus rhythm      Assessment and Plan:      Acute respiratory failure with hypoxia (HCC) (09/03/2020) POA: due to Co-V-19 viral infection. Now down to 30L/90% supplemental oxygen. Continue to follow        Pneumonia due to COVID-19 virus (09/03/2020) / Sepsis POA: she is unvaccinated and now with recovering severe disease. CTA neg for PE but multifocal disease. Inflammatory markers trending down. Continue Dexamethasone, Baricitinib and enoxaparin. Incetive  spirometry. Monitor       Bradycardia, sinus POA: she has no symptoms. Likely due to underlying OSA. Will need outpatient sleep studies. Given she was initially hypotensive, will check an Echocardiogram      Elevated liver enzymes (09/03/2020) POA: stable. Monitor       Morbid obesity:??Body mass index is 58.53 kg/m??.??RF for severe COVID.??Would benefit from weight loss and dietary / lifestyle modifications   ??   Total time spent for the patient's care: 30  Minutes                    Care Plan discussed with: Patient, Care Manager and Nursing Staff      Discussed:  Care Plan and D/C Planning      Prophylaxis:  Lovenox      Anticipated Disposition:  Home w/Family             ___________________________________________________      Attending Physician:   Melynda Keller, MD

## 2020-09-09 NOTE — Progress Notes (Signed)
Chaplain responded to an in-basket request to assist Mrs. Yancey Flemings with an Therapist, music (AMD) on the Prog Care Tele unit. Mrs. Sahagian is on Covid+ contact precautions. Consulted with her nurse who agreed to take the AMD forms into Mrs. Finklea's room when able. She also noted Mrs. Brossart would be able to have a conversation with the chaplain via telephone, but chaplain should probably wait until tomorrow to call into the room as Mrs. Laseter didn't get much rest last night. Chaplain's are available for further support upon referral  Chaplain Serita Grammes. Victoriano Lain, MDiv.    Chaplain Paging Service: [phone])

## 2020-09-09 NOTE — Progress Notes (Signed)
 Music Therapy Assessment  [facility]    Yohana Bartha [id number]     Jul 11, 1976  44 y.o.  female    Patient Telephone Number: [phone] (home)   Religious Affiliation: No preference   Language: English   Patient Active Problem List    Diagnosis Date Noted   . Acute respiratory failure with hypoxia (HCC) 09/03/2020   . Pneumonia due to COVID-19 virus 09/03/2020   . Elevated liver enzymes 09/03/2020   . SIRS (systemic inflammatory response syndrome) (HCC) 09/03/2020   . COVID-19 09/03/2020        Date: 09/09/2020            Total Time (in minutes): 5          SFM 3 PROG CARE TELE 2    Mental Status:   [  ] Alert [  ] Forgetful [  ]  Confused  [  ] Minimally responsive  [  ] Sleeping -N/A    Communication Status: [  ] Impaired Speech [  ] Nonverbal -N/A    Physical Status:   [  ] Oxygen in use  [  ] Hard of Hearing [  ] Vision Impaired  [  ] Ambulatory  [  ] Ambulatory with assistance [  ] Non-ambulatory -N/A    Music Preferences, Background: N/A: Please See Session Observations Below    Clinical Problem to be addressed: Comfort    Goal(s) met in session: N/A: Please See Session Observations Below  Physical/Pain management (Scale of 1-10):    Pre-session rating ___________    Post-session rating __________  [  ] Increased relaxation   [  ] Affected breathing patterns  [  ] Decreased muscle tension   [  ] Decreased agitation  [  ] Affected heart rate    [  ] Increased alertness     Emotional/Psychological:  [  ] Increased self-expression   [  ] Decreased aggressive behavior   [  ] Decreased feelings of stress  [  ] Discussed healthy coping skills     [  ] Improved mood    [  ] Decreased withdrawn behavior     Social:  [  ] Decreased feelings of isolation/loneliness [  ] Positive social interaction   [  ] Provided support and/or comfort for family/friends    Spiritual:  [  ] Spiritual support    [  ] Expressed peace  [  ] Expressed faith    [  ] Discussed beliefs    Techniques Utilized (Check all that  apply): N/A: Please See Session Observations Below  [  ] Procedural support MT [  ] Music for relaxation [  ] Patient preferred music  [  ] Lyric analysis  [  ] Song choice  [  ] Music for validation  [  ] Entrainment  [  ] Movement to music [  ] Guided visualization  [  ] ISO Principle  [  ] Patient instrument playing [  ] Song writing  [  ] Sing along   [  ] Improvisation  [  ] Sensory stimulation  [  ] Active Listening  [  ] Music for spiritual support [  ] Making of CDs as gifts    Session Observations:  Referred by Lamar Gowers, Chaplain. This music therapist eligible (MTE) consulted with the patient's (pt) RN to see if it would be appropriate to offer music therapy services at this  itme. Pt's RN responded to this, sharing the pt did not get enough rest last night and suggested coming later in the afternoon or another day would be best. MTE expressed understanding to this and exited.     Vernell Radish, Music Therapist Eligible  Spiritual Care Department

## 2020-09-09 NOTE — Progress Notes (Signed)
This patient was assisted with Intentional Toileting every 2 hours during this shift as appropriate.  Documentation of ambulation and output reflected on Flowsheet as appropriate.  Purposeful hourly rounding was completed using AIDET and 5Ps.  Outcomes of PHR documented as they occurred. Bed alarm in use as appropriate.  Dual Suction and ambubag in place.

## 2020-09-09 NOTE — Progress Notes (Signed)
TRANSFER - IN REPORT: 70    Verbal report received from [facility] RN(name) on Latanya Hannaford  being received from ICU 3006(unit) for routine progression of care      Report consisted of patient's Situation, Background, Assessment and   Recommendations(SBAR).     Information from the following report(s) SBAR, Kardex, ED Summary, Intake/Output and Recent Results was reviewed with the receiving nurse.    Opportunity for questions and clarification was provided.      Assessment completed upon patient's arrival to unit and care assumed.     0310: pt arrived to floor hypotensive and bradycardic    0332: To Dr Nolon Bussing via perfectserve "Good morning! I just received this pt from ICU, she is here for COVID and is currently on 35L @ 45%, her first BP I took was 83/40, the last one was 82/49, maps were 50&56, and she is brady hanging out around 45-50. Per ICU charted vitals, BPs have been 100's-110's/40's-50's with maps in the 60's&70's and HR 50's-60's. She is asymptomatic. I just cycled it again and it is 77/31 map 40." MD ordered solucortef and parameters set for map above 60 half hour afer administration    0400: solucortef ordered for 0600, asked MD if ok to change dose to now    0441: MD ok'd dose to be given now    0500: map 90    0526: map 54, notified MD    0534: MD requests take bp with pt sitting up    0549: pt bp 92/44 map 52, notified MD    0554: MD ordered 10mg  PO midodrine, request that MD enter orders     0600: MD states she will enter orders    0607: MD requests unit number and says her laptop restarted    0617: midodrine orders entered for 0800      Bedside shift change report given to [facility] RN (oncoming nurse) by Rolly Salter (offgoing nurse). Report included the following information SBAR, Kardex, ED Summary, Intake/Output, and Recent Results.       This patient was assisted with Intentional Toileting every 2 hours during this shift as appropriate.  Documentation of ambulation and output reflected on  Flowsheet as appropriate.  Purposeful hourly rounding was completed using AIDET and 5Ps.  Outcomes of PHR documented as they occurred. Bed alarm in use as appropriate.  Dual Suction and ambubag in place.

## 2020-09-09 NOTE — Progress Notes (Signed)
0700 Bedside and Verbal shift change report given to [facility] RN (oncoming nurse) by Rolly Salter RN (offgoing nurse). Report included the following information SBAR, Kardex, ED Summary, Intake/Output, MAR, Recent Results and Cardiac Rhythm SB/NSR.     This patient was assisted with Intentional Toileting every 2 hours during this shift as appropriate.  Documentation of ambulation and output reflected on Flowsheet as appropriate.  Purposeful hourly rounding was completed using AIDET and 5Ps.  Outcomes of PHR documented as they occurred. Bed alarm in use as appropriate.  Dual Suction and ambubag in place.    0732 Pt BP 93/48 and HR 53, notified Dr. Lorette Ang, orders received to continue monitoring once PO Midodrine has been given.     4401 Notified Dr. Lorette Ang that Pt BP 84/49 and HR 45, orders received to continue monitoring.     0830 Notified Dr. Lorette Ang that Pt HR now in the mid 30s and BP 114/51, Pt is otherwise asymptomatic. Orders received to give a 500 mL bolus of fluids and obtain EKG. Pacer pads applied to Pt. MD denies need for a cardiology consult.     0272 EKG obtained and Dr. Lorette Ang claims otherwise remarkable minus Sinus Bradycardia. Orders received to continue monitoring.     1550 Notified by PT that Pt experienced an episode of dizziness and BP dropped while ambulating to recliner. Pt now sitting in recliner denying dizziness, HR 40s-50s and BP 94/49. Notified Dr. Lorette Ang and orders received to give additional 500 mL bolus now.     1735 Pt HR dropped to the 30s and now back into the 40s-50s. Notified Dr. Lorette Ang, orders received to continue monitoring.    1930 Bedside and Verbal shift change report given to Danielle RN (oncoming nurse) by Serita Kyle RN (offgoing nurse). Report included the following information SBAR, Kardex, ED Summary, Intake/Output, MAR, Recent Results and Cardiac Rhythm SB.

## 2020-09-09 NOTE — Progress Notes (Signed)
Problem: Mobility Impaired (Adult and Pediatric)  Goal: *Acute Goals and Plan of Care (Insert Text)  Description: FUNCTIONAL STATUS PRIOR TO ADMISSION: Patient was independent and active without use of DME.    HOME SUPPORT PRIOR TO ADMISSION: The patient lived with family but did not require assist.    Physical Therapy Goals  Initiated 09/07/2020  1.  Patient will move from supine to sit and sit to supine , scoot up and down, and roll side to side in bed with independence within 7 day(s).    2.  Patient will transfer from bed to chair and chair to bed with independence using the least restrictive device within 7 day(s).  3.  Patient will perform sit to stand with independence within 7 day(s).  4.  Patient will ambulate with independence for 200 feet with the least restrictive device within 7 day(s).   5.  Patient will ascend/descend 3 stairs with  handrail(s) with modified independence within 7 day(s).     Note:   PHYSICAL THERAPY TREATMENT  Patient: Teresa Allen (44 y.o. female)  Date: 09/09/2020  Diagnosis: COVID-19 [U07.1]   <principal problem not specified>       Precautions:  fall  Chart, physical therapy assessment, plan of care and goals were reviewed.    ASSESSMENT  Patient continues with skilled PT services and is progressing towards goals. Patient with continued bradycardia and low blood pressure- see below.  Patient heart rate improved to 52-57 bpm in sitting and with transfer.  Once transfer to chair lightheaded and drop in pressure, attempted ankle pumps for improved return, still with low blood pressure- see below for details.     Current Level of Function Impacting Discharge (mobility/balance): CGA with transfer to chair    Other factors to consider for discharge:          PLAN :  Patient continues to benefit from skilled intervention to address the above impairments.  Continue treatment per established plan of care.  to address goals.    Recommendation for discharge: (in order for the patient to  meet his/her long term goals)  Physical therapy at least 2 days/week in the home vs TBD pending progress    This discharge recommendation:  A follow-up discussion with the attending provider and/or case management is planned    IF patient discharges home will need the following DME: to be determined (TBD)       SUBJECTIVE:   Patient stated "i had a really bad night and morning."    OBJECTIVE DATA SUMMARY:   Critical Behavior:  Neurologic State: Alert, Appropriate for age  Orientation Level: Oriented X4  Cognition: Appropriate decision making, Appropriate for age attention/concentration, Appropriate safety awareness, Follows commands     Patient Vitals for the past 8 hrs:   Position Pulse BP SpO2   09/09/20 1555 -- (!) 54 -- 93 %   09/09/20 1545 Sitting in chair (!) 57 (!) 94/49 95 %   09/09/20 1538 Sitting in chair (!) 50 (!) 101/43 --   09/09/20 1534 Sitting in chair (!) 50 (!) 88/42 --   09/09/20 1531 Sitting in chair -- (!) 89/41 --   09/09/20 1528 Sitting edge of bed (!) 48 (!) 90/40 93 %   09/09/20 1515  (!) 57 -- 93 %   09/09/20 1506 supine (!) 48 (!) 99/52 97 %   09/09/20 1442 -- -- -- 95 %   09/09/20 1337  (!) 54 -- 95 %  Functional Mobility Training:  Bed Mobility:     Supine to Sit: Stand-by assistance  Sit to Supine: Stand-by assistance           Transfers:  Sit to Stand: Minimum assistance  Stand to Sit: Minimum assistance        Bed to Chair: Minimum assistance                      Therapeutic Exercises:   Ankle pumps, pursed lip breathing  Pain Rating:  None reported    Activity Tolerance:   Fair    After treatment patient left in no apparent distress:   Sitting in chair, Call bell within reach, and Bed / chair alarm activated    COMMUNICATION/COLLABORATION:   The patient's plan of care was discussed with: Registered nurse.     Mick Sell, PT, DPT   Time Calculation: 48 mins

## 2020-09-09 NOTE — Progress Notes (Signed)
CARE MANAGEMENT   DAILY PROGRESS NOTE        NAME:   Teresa Allen   DOB:     1976-04-17   MRN:     [id number]     RUR:  9%  Risk Level:  Low  Value-based purchasing:   No   Bundle patient:  No    Transition of care plan:  1. Pt admitted with acute respiratory failure with hypoxia 2/2 COVID 19. Pt is followed by the Hospitalist.   2. Pt is currently on heated hi flow at 30L. Pt is on IV steroids.   3. PT eval recommends HH PT and OT eval indicates discharge needs TBD. Lack of insurance is a barrier to [facility] PT. If HH still needed upon discharge, CM will see if Sondra Barg[facility] is able to offer visits.  4. Prior to admission, Pt lived at home with her 4 underage children. Pt was independent with ADLs. No services or DME. 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 Pt's sister and brother in law live locally and are a support system for Pt.  5. Discharge plan remains for patient to return home.  CM will continue to follow for discharge needs. Needs unclear at this time (HH, home O2, etc).   6. PRIOR TO DISCHARGE, Pt will need Sondra Barges PCP list as well as Care Card application.   7. Outpatient follow up.  8. Transportation: Family      _____________________________________  Hoy Morn, BSW - Care Management  09/09/2020   10:51 AM

## 2020-09-10 ENCOUNTER — Inpatient Hospital Stay: Admit: 2020-09-10 | Payer: BLUE CROSS/BLUE SHIELD | Primary: Family Medicine

## 2020-09-10 LAB — BASIC METABOLIC PANEL
Anion Gap: 7 mmol/L (ref 5–15)
BUN: 9 MG/DL (ref 6–20)
Bun/Cre Ratio: 20 (ref 12–20)
CO2: 25 mmol/L (ref 21–32)
Calcium: 7.2 MG/DL — ABNORMAL LOW (ref 8.5–10.1)
Chloride: 113 mmol/L — ABNORMAL HIGH (ref 97–108)
Creatinine: 0.46 MG/DL — ABNORMAL LOW (ref 0.55–1.02)
EGFR IF NonAfrican American: >60 mL/min/{1.73_m2} (ref >60)
GFR African American: >60 mL/min/{1.73_m2} (ref >60)
Glucose: 129 mg/dL — ABNORMAL HIGH (ref 65–100)
Potassium: 3.7 mmol/L (ref 3.5–5.1)
Sodium: 145 mmol/L (ref 136–145)

## 2020-09-10 LAB — TRANSTHORACIC ECHOCARDIOGRAM (TTE) COMPLETE (CONTRAST/BUBBLE/3D PRN)
AV Annulus: 3.04 cm
AV Area by Peak Velocity: 1.98 cm2
AV Area by VTI: 2.08 cm2
AV Mean Gradient: 4.1 mmHg
AV Peak Gradient: 8.05 mmHg
AV Peak Velocity: 141.86 cm/s
AV VTI: 36.61 cm
AVA/BSA Peak Velocity: 0.8 cm2/m2
AVA/BSA VTI: 0.9 cm2/m2
Ascending Aorta: 2.5 cm
E/E' Lateral: 8.52
E/E' Ratio (Averaged): 7.75
E/E' Septal: 6.97
IVC Proxmal: 2.14 cm
IVSd: 0.89 cm (ref 0.6–0.9)
LA Area 4C: 19.49 cm2
LA Major Axis: 3.59 cm
LA Minor Axis: 1.48 cm
LA Volume 2C: 44.59 mL (ref 22–52)
LA Volume 4C: 61.82 mL — AB (ref 22–52)
LA Volume BP: 56.98 mL (ref 22–52)
LA Volume BP: 58.62 mL (ref 22–52)
LA Volume Index 2C: 18.43 ml/m2 (ref 16–28)
LA Volume Index 4C: 25.55 ml/m2 (ref 16–28)
LA Volume Index BP: 24.22 ml/m2 (ref 16–28)
LV E' Lateral Velocity: 9.93 cm/s
LV E' Septal Velocity: 12.13 cm/s
LV Mass 2D Index: 70.7 g/m2 (ref 43–95)
LV Mass 2D: 171.2 g (ref 67–162)
LVIDd: 5.13 cm (ref 3.9–5.3)
LVIDs: 3.04 cm
LVOT Cardiac Output: 11.41 liter/minute
LVOT Diameter: 1.96 cm
LVOT Mean Gradient: 1.75 mmHg
LVOT Peak Gradient: 3.43 mmHg
LVOT Peak Velocity: 92.59 cm/s
LVOT SV: 76 mL
LVOT VTI: 25.1 cm
LVPWd: 0.96 cm — AB (ref 0.6–0.9)
Left Ventricular Ejection Fraction: 58
MV A Velocity: 38.12 cm/s
MV E Velocity: 84.6 cm/s
MV E Wave Deceleration Time: 164.6 ms
MV E/A: 2.22
PR Max Velocity: 103.8 cm/s
PV Max Velocity: 99.61 cm/s
PV Systolic Peak Instantaneous Gradient: 4.08 mmHg
RVIDd: 4.44 cm
TAPSE: 2.67 cm — AB (ref 1.5–2)
TR Max Velocity: 145.44 cm/s
TR Peak Gradient: 8.46 mmHg

## 2020-09-10 LAB — CULTURE, BLOOD 1
Culture: NO GROWTH
Culture: NO GROWTH

## 2020-09-10 LAB — MAGNESIUM
Magnesium: 1.9 mg/dL (ref 1.6–2.4)
Magnesium: 1.9 mg/dL (ref 1.6–2.4)

## 2020-09-10 LAB — TSH 3RD GENERATION
TSH: 0.84 u[IU]/mL (ref 0.36–3.74)
TSH: 0.84 u[IU]/mL (ref 0.36–3.74)

## 2020-09-10 LAB — HEPATIC FUNCTION PANEL
A-G Ratio: 0.6 — ABNORMAL LOW (ref 1.1–2.2)
ALT (SGPT): 137 U/L — ABNORMAL HIGH (ref 12–78)
ALT: 137 U/L — ABNORMAL HIGH (ref 12–78)
AST (SGOT): 31 U/L (ref 15–37)
AST: 31 U/L (ref 15–37)
Albumin/Globulin Ratio: 0.6 — ABNORMAL LOW (ref 1.1–2.2)
Albumin: 2.2 g/dL — ABNORMAL LOW (ref 3.5–5.0)
Albumin: 2.2 g/dL — ABNORMAL LOW (ref 3.5–5.0)
Alk. phosphatase: 57 U/L (ref 45–117)
Alkaline Phosphatase: 57 U/L (ref 45–117)
Bilirubin, Direct: 0.1 MG/DL (ref 0.0–0.2)
Bilirubin, direct: 0.1 MG/DL (ref 0.0–0.2)
Bilirubin, total: 0.2 MG/DL (ref 0.2–1.0)
Globulin: 3.4 g/dL (ref 2.0–4.0)
Globulin: 3.4 g/dL (ref 2.0–4.0)
Protein, total: 5.6 g/dL — ABNORMAL LOW (ref 6.4–8.2)
Total Bilirubin: 0.2 MG/DL (ref 0.2–1.0)
Total Protein: 5.6 g/dL — ABNORMAL LOW (ref 6.4–8.2)

## 2020-09-10 LAB — CREATININE
Creatinine: 0.59 MG/DL (ref 0.55–1.02)
Creatinine: 0.59 MG/DL (ref 0.55–1.02)
EGFR IF NonAfrican American: >60 mL/min/{1.73_m2} (ref >60)
GFR African American: >60 mL/min/{1.73_m2} (ref >60)
GFR est AA: >60 mL/min/{1.73_m2} (ref >60)
GFR est non-AA: >60 mL/min/{1.73_m2} (ref >60)

## 2020-09-10 LAB — METABOLIC PANEL, BASIC
Anion gap: 7 mmol/L (ref 5–15)
BUN/Creatinine ratio: 20 (ref 12–20)
BUN: 9 MG/DL (ref 6–20)
CO2: 25 mmol/L (ref 21–32)
Calcium: 7.2 MG/DL — ABNORMAL LOW (ref 8.5–10.1)
Chloride: 113 mmol/L — ABNORMAL HIGH (ref 97–108)
Creatinine: 0.46 MG/DL — ABNORMAL LOW (ref 0.55–1.02)
GFR est AA: >60 mL/min/{1.73_m2} (ref >60)
GFR est non-AA: >60 mL/min/{1.73_m2} (ref >60)
Glucose: 129 mg/dL — ABNORMAL HIGH (ref 65–100)
Potassium: 3.7 mmol/L (ref 3.5–5.1)
Sodium: 145 mmol/L (ref 136–145)

## 2020-09-10 LAB — ECHO ADULT COMPLETE
AV Annulus: 3.04 cm
AV Area by Peak Velocity: 1.98 cm2
AV Area by VTI: 2.08 cm2
AV Mean Gradient: 4.1 mmHg
AV Peak Gradient: 8.05 mmHg
AV Peak Velocity: 141.86 cm/s
AV VTI: 36.61 cm
AVA/BSA Peak Velocity: 0.8 cm2/m2
AVA/BSA VTI: 0.9 cm2/m2
Ascending Aorta: 2.5 cm
E/E' Lateral: 8.52
E/E' Ratio (Averaged): 7.75
E/E' Septal: 6.97
IVC Proxmal: 2.14 cm
IVSd: 0.89 cm (ref 0.6–0.9)
LA Area 4C: 19.49 cm2
LA Major Axis: 3.59 cm
LA Minor Axis: 1.48 cm
LA Volume 2C: 44.59 mL (ref 22–52)
LA Volume 4C: 61.82 mL — AB (ref 22–52)
LA Volume BP: 58.62 mL (ref 22–52)
LA Volume DISK BP: 56.98 mL (ref 22–52)
LA Volume Index 2C: 18.43 ml/m2 (ref 16–28)
LA Volume Index 4C: 25.55 ml/m2 (ref 16–28)
LA Volume Index BP: 24.22 ml/m2 (ref 16–28)
LV E' Lateral Velocity: 9.93 cm/s
LV E' Septal Velocity: 12.13 cm/s
LV Mass 2D Index: 70.7 g/m2 (ref 43–95)
LV Mass 2D: 171.2 g (ref 67–162)
LVIDd: 5.13 cm (ref 3.9–5.3)
LVIDs: 3.04 cm
LVOT Cardiac Output: 11.41 liter/minute
LVOT Diameter: 1.96 cm
LVOT Peak Gradient: 3.43 mmHg
LVOT Peak Velocity: 92.59 cm/s
LVOT SV: 76 mL
LVOT VTI: 25.1 cm
LVPWd: 0.96 cm — AB (ref 0.6–0.9)
Left Ventricular Outflow Tract Mean Gradient: 1.75 mmHg
MV A Velocity: 38.12 cm/s
MV E Velocity: 84.6 cm/s
MV E Wave Deceleration Time: 164.6 ms
MV E/A: 2.22
PR Max Velocity: 103.8 cm/s
PV Max Velocity: 99.61 cm/s
PV Systolic Peak Instantaneous Gradient: 4.08 mmHg
RVIDd: 4.44 cm
TAPSE: 2.67 cm — AB (ref 1.5–2.0)
TR Max Velocity: 145.44 cm/s
TR Peak Gradient: 8.46 mmHg

## 2020-09-10 LAB — SAMPLES BEING HELD

## 2020-09-10 LAB — CULTURE, BLOOD
Culture result:: NO GROWTH
Culture result:: NO GROWTH

## 2020-09-10 MED ORDER — HYDROCORTISONE SOD SUCCINATE (PF) 100 MG/2 ML SOLUTION FOR INJECTION
100 mg/2 mL | Freq: Four times a day (QID) | INTRAMUSCULAR | Status: AC
Start: 2020-09-10 — End: 2020-09-11
  Administered 2020-09-10 – 2020-09-11 (×4): via INTRAVENOUS

## 2020-09-10 MED ORDER — SODIUM CHLORIDE 0.9% BOLUS IV
0.9 % | Freq: Once | INTRAVENOUS | Status: AC
Start: 2020-09-10 — End: 2020-09-10
  Administered 2020-09-10: 13:00:00 via INTRAVENOUS

## 2020-09-10 MED ORDER — MIDODRINE 5 MG TAB
5 mg | Freq: Three times a day (TID) | ORAL | Status: DC
Start: 2020-09-10 — End: 2020-09-10
  Administered 2020-09-10: 05:00:00 via ORAL

## 2020-09-10 MED ORDER — DEXAMETHASONE SODIUM PHOSPHATE (PF) 10 MG/ML INJECTION
10 mg/mL | Freq: Two times a day (BID) | INTRAMUSCULAR | Status: DC
Start: 2020-09-10 — End: 2020-09-13
  Administered 2020-09-12 – 2020-09-13 (×4): via INTRAVENOUS

## 2020-09-10 MED FILL — MIDODRINE 5 MG TAB: 5 mg | ORAL | Qty: 2

## 2020-09-10 MED FILL — ENOXAPARIN 40 MG/0.4 ML SUB-Q SYRINGE: 40 mg/0.4 mL | SUBCUTANEOUS | Qty: 0.4

## 2020-09-10 MED FILL — SOLU-CORTEF ACT-O-VIAL (PF) 100 MG/2 ML SOLUTION FOR INJECTION: 100 mg/2 mL | INTRAMUSCULAR | Qty: 2

## 2020-09-10 MED FILL — MELATONIN 5 MG TAB, RAPID DISSOLVE: 5 mg | ORAL | Qty: 1

## 2020-09-10 MED FILL — ASCORBIC ACID 500 MG TAB: 500 mg | ORAL | Qty: 1

## 2020-09-10 MED FILL — SODIUM CHLORIDE 0.9 % IV: INTRAVENOUS | Qty: 500

## 2020-09-10 MED FILL — OLUMIANT 2 MG TABLET: 2 mg | ORAL | Qty: 2

## 2020-09-10 MED FILL — ZINC SULFATE 220 MG (50 MG ELEMENTAL ZINC) CAP: 50 mg zinc (220 mg) | ORAL | Qty: 1

## 2020-09-10 MED FILL — CHOLECALCIFEROL (VITAMIN D3) 1,000 UNIT (25 MCG) TAB: ORAL | Qty: 1

## 2020-09-10 MED FILL — SOLU-CORTEF ACT-O-VIAL (PF) 100 MG/2 ML SOLUTION FOR INJECTION: 100 mg/2 mL | INTRAMUSCULAR | Qty: 1

## 2020-09-10 NOTE — Progress Notes (Signed)
 Transition of Care Plan:  RUR-7%; LOS 7 days; covid+  1. Pt admitted with acute respiratory failure with hypoxia 2/2 COVID 19. Pt is followed by the Hospitalist.   2. Pt is currently on heated hi flow at 30L. Pt is on IV steroids.   3. PT eval recommends HH PT and OT eval indicates discharge needs TBD. Lack of insurance is a barrier to [facility] PT. If HH still needed upon discharge, CM will see if [facility] is able to offer visits.  4. Prior to admission, Pt lived at home with her 4 underage children. Pt was independent with ADLs. No services or DME. 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 Pt's sister and brother in law live locally and are a support system for Pt.  5. Will need charity visits for any therapy; Care Fund for any O2 needs for home   6. PRIOR TO DISCHARGE, Pt will need [facility] PCP list as well as Care Card application.   7. Outpatient follow up.  8. Transportation: Family  L.Erline, RN

## 2020-09-10 NOTE — Progress Notes (Signed)
Chaplain responded to an RRT for Mrs. Teresa Allen on the [facility] unit. Mrs. Teresa Allen is on Covid+ contact precautions, unable to assess at this time. Chaplain consulted with the Nursing Supervisor. Chaplain's are available for further support upon referral  Chaplain Serita Grammes. Victoriano Lain, MDiv.    Chaplain Paging Service: [phone])

## 2020-09-10 NOTE — Progress Notes (Signed)
Called about concern for patient. Chart reviewed. Reached out to Dr Lorette Ang recommending daily labs and cards consult. Patient currently hemodynamically stable, on high flow, BP WNL per recordings, with asymptomatic bradycardia. No interventions required for asymptomatic bradycardia except to follow- unless recommendations from cardiologist. Asked for cards consult and labs from Dr Lorette Ang. No other interventions assessed needed at this time as patient is not in heart block, just sinus brady. Reviewed tele strips with tele monitor

## 2020-09-10 NOTE — Progress Notes (Signed)
Progress Notes by Melynda Keller, MD at 09/10/20 1034                Author: Melynda Keller, MD  Service: Hospitalist  Author Type: Physician       Filed: 09/10/20 1038  Date of Service: 09/10/20 1034  Status: Signed          Editor: Melynda Keller, MD (Physician)                                                                                   [facility]   [address] [place], [place] [address]   [phone]         Medical Progress Note         NAME:         Teresa Allen    DOB:        04/28/1976   MRM:        [id number]      Date of service: 09/10/2020        Subjective: Patient has been seen and examined as a follow up for multiple medical issues. Chart, labs, diagnostics reviewed. She remains weak.  Still with persistent bradycardia for which she denies any symptoms. No chest pain or SOB. No fever        Objective:      Vital Signs:      Visit Vitals      BP  (!) 116/55        Pulse  (!) 42     Temp  98.1 ??F (36.7 ??C)     Resp  19     Ht  5\' 2"  (1.575 m)     Wt  158.8 kg (350 lb)     SpO2  97%        BMI  64.02 kg/m??              Intake/Output Summary (Last 24 hours) at 09/10/2020 1034   Last data filed at 09/10/2020 0106     Gross per 24 hour        Intake  480 ml        Output  650 ml        Net  -170 ml            Physical Examination:      General:   Weak and ill looking but not in any acute distress     Eyes:   pink conjunctivae, PERRLA with no discharge.   ENT:   no ottorrhea or rhinorrhea with dry mucous membranes   Neck: no masses, thyroid non-tender and trachea central.   Pulm: + accessory muscle use, decreased breath sounds with scattered crackles. No wheezes   Card:  has regular and bradycardia, without thrills, bruits or peripheral edema   Abd:  Soft, non-tender, non-distended, normoactive bowel sounds    Musc:  No cyanosis, clubbing, atrophy or deformities.   Skin:  No rashes, bruising or ulcers.    Neuro: Awake and alert. Generally a non focal exam.     Psych:  Has a good insight and is oriented x 3  Current Facility-Administered Medications          Medication  Dose  Route  Frequency           ?  hydrocortisone Sod Succ (PF) (SOLU-CORTEF) injection 50 mg   50 mg  IntraVENous  Q6H     ?  midodrine (PROAMATINE) tablet 10 mg   10 mg  Oral  TID WITH MEALS     ?  dexamethasone (PF) (DECADRON) 10 mg/mL injection 10 mg   10 mg  IntraVENous  Q12H     ?  benzonatate (TESSALON) capsule 100 mg   100 mg  Oral  TID PRN     ?  loperamide (IMODIUM) capsule 2 mg   2 mg  Oral  Q4H PRN     ?  ascorbic acid (vitamin C) (VITAMIN C) tablet 500 mg   500 mg  Oral  BID     ?  zinc sulfate (ZINCATE) 50 mg zinc (220 mg) capsule 1 Capsule   1 Capsule  Oral  DAILY     ?  cholecalciferol (VITAMIN D3) (1000 Units /25 mcg) tablet 1,000 Units   1,000 Units  Oral  DAILY     ?  melatonin (rapid dissolve) tablet 5 mg   5 mg  Oral  QHS     ?  sodium chloride (NS) flush 5-10 mL   5-10 mL  IntraVENous  PRN     ?  sodium chloride (NS) flush 5-40 mL   5-40 mL  IntraVENous  Q8H     ?  sodium chloride (NS) flush 5-40 mL   5-40 mL  IntraVENous  PRN     ?  acetaminophen (TYLENOL) tablet 650 mg   650 mg  Oral  Q6H PRN          Or           ?  acetaminophen (TYLENOL) suppository 650 mg   650 mg  Rectal  Q6H PRN     ?  polyethylene glycol (MIRALAX) packet 17 g   17 g  Oral  DAILY PRN           ?  enoxaparin (LOVENOX) injection 40 mg   40 mg  SubCUTAneous  Q12H           ?  baricitinib (OLUMIANT) tablet 4 mg   4 mg  Oral  DAILY            Laboratory data and review:      No results for input(s): WBC, HGB, HCT, PLT, HGBEXT, HCTEXT, PLTEXT, HGBEXT, HCTEXT, PLTEXT in the last 72 hours.     Recent Labs             09/10/20   0505  09/09/20   0257  09/08/20   0613     NA   --   142   --      K   --   4.4   --      CL   --   108   --      CO2   --   27   --      GLU   --   149*   --      BUN   --   13   --      CREA  0.59  0.58  0.44*     CA   --   8.2*   --  ALB   --   2.1*   --           ALT   --    144*   --         No components found for: [facility]      Diagnostics: Imaging studies have been reviewed      Telemetry reviewed by me:   normal sinus rhythm      Assessment and Plan:      Acute respiratory failure with hypoxia (HCC) (09/03/2020) POA: due to Co-V-19 viral infection. Now down to 25L supplemental oxygen. Continue to follow. Ambulate as tolerated      Pneumonia due to COVID-19 virus (09/03/2020) / Sepsis POA: she is unvaccinated and now with recovering severe disease. CTA neg for PE but multifocal disease. Inflammatory markers improving. Continue Dexamethasone, Baricitinib and enoxaparin. Incetive  spirometry. Continue to follow      Bradycardia, sinus POA: she has no symptoms. Likely due to underlying OSA. Echo pending. Get a TSH. Repeat labs today. Given persistence of her bradycardia and hypotensive episodes, will consult cardiology       Elevated liver enzymes (09/03/2020) POA: stable. Repeat LFTs today.        Morbid obesity:??Body mass index is 58.53 kg/m??.??RF for severe COVID.??Would benefit from weight loss and dietary / lifestyle modifications   ??   Total time spent for the patient's care: 30  Minutes                    Care Plan discussed with: Patient, Care Manager and Nursing Staff      Discussed:  Care Plan and D/C Planning      Prophylaxis:  Lovenox      Anticipated Disposition:  Home w/Family             ___________________________________________________      Attending Physician:   Melynda Keller, MD

## 2020-09-10 NOTE — Progress Notes (Signed)
RR was called on this patient, I was notified by RN.  Patient is having asymptomatic hypotension.  I advised trendelenburg, but the patient cannot tolerate due to morbid obesity.  I have already given solu=cortef and midodrine.  Cannot bolus patient 2/2 symptomatic covid 19 pna    At this time, since the patient clearly runs low, has been running low all day without any symptoms, will hold off on any treatment.  I advised RN to mildly stimulate patient at 6A to make sure she is still arousable, and if she is, will let her rest.

## 2020-09-10 NOTE — Progress Notes (Signed)
Problem: Self Care Deficits Care Plan (Adult)  Goal: *Therapy Goal (Edit Goal, Insert Text)  Description: FUNCTIONAL STATUS PRIOR TO ADMISSION: Patient was independent and active without use of DME. NO supplemental O2 use    HOME SUPPORT: The patient lived with her children.    Occupational Therapy  Initiated 09/07/2020  Target for all goals: SPO2 >88% on </= 6L/min via nasal cannula  1.  Patient will perform grooming in stand with modified independence within 7 day(s).  2.  Patient will perform bathing with modified independence within 7 day(s).  3.  Patient will perform lower body dressing with modified independence within 7 day(s).  4.  Patient will perform toilet transfers with modified independence within 7 day(s).  5.  Patient will perform all aspects of toileting with modified independence within 7 day(s).  6.  Patient will participate in upper extremity therapeutic exercise/activities with modified independence for 10 minutes within 7 day(s).    7.  Patient will utilize energy conservation techniques during functional activities with verbal cues within 7 day(s).      Outcome: Progressing Towards Goal     OCCUPATIONAL THERAPY TREATMENT  Patient: Teresa Allen (44 y.o. female)  Date: 09/10/2020  Diagnosis: COVID-19 [U07.1]   <principal problem not specified>       Precautions:    Chart, occupational therapy assessment, plan of care, and goals were reviewed.    ASSESSMENT  Patient continues with skilled OT services and is progressing towards goals.  Patient continues to present with decreased activity tolerance and endurance, agreeable to minimal activity due to requesting to toilet and eat prior to therapy. She is able to SPT to [facility] with overall SBA and is able to progress to completing bladder hygiene with CGA but still requires up to max A for bowel hygiene due to impaired reach and generalized fatigue and weakness. Noted HR ranged to 40s-50s mostly but did reach up to 77 bpm. BP soft but stable  throughout. At end of session, pt able to perform serial grooming tasks. SpO2 >90% on 25L via HFNC.      Current Level of Function Impacting Discharge (ADLs): overall SBA for ADL transfers; up to max A for LB ADLs and bowel hygiene    Other factors to consider for discharge: occasionally self limiting; hi flow O2 needs         PLAN :  Patient continues to benefit from skilled intervention to address the above impairments.  Continue treatment per established plan of care to address goals.    Recommend with staff: BSC for toileting with SBA; Encourage participation with ADLs    Recommend next OT session: UE HEP with handout    Recommendation for discharge: (in order for the patient to meet his/her long term goals)  To be determined: pulmonary rehab vs. TBD    This discharge recommendation:  Has been made in collaboration with the attending provider and/or case management    IF patient discharges home will need the following DME: TBD       SUBJECTIVE:   Patient stated "I only ever get cold food, I'm so frustrated."    OBJECTIVE DATA SUMMARY:   Cognitive/Behavioral Status:  Neurologic State: Alert  Orientation Level: Oriented X4  Cognition: Appropriate decision making; Follows commands  Perception: Appears intact  Perseveration: No perseveration noted  Safety/Judgement: Awareness of environment; Fall prevention    Functional Mobility and Transfers for ADLs:  Bed Mobility:  Supine to Sit: Stand-by assistance  Sit to Supine: Stand-by assistance  Scooting: Stand-by assistance    Transfers:  Sit to Stand: Chiropodist : Stand-by assistance; Contact guard assistance (for SPT to [facility])  Cues: Verbal cues provided  Adaptive Equipment: Bedside commode  Bed to Chair: Stand-by assistance    Balance:  Sitting: Intact  Standing: Impaired  Standing - Static: Good  Standing - Dynamic : Fair    ADL Intervention:  Grooming  Grooming Assistance: Set-up; Supervision  Position Performed: Seated  in chair  Washing Face: Set-up; Supervision  Washing Hands: Set-up  Brushing Teeth: Set-up; Supervision    Toileting  Bladder Hygiene: Contact guard assistance (standing)  Bowel Hygiene: Maximum assistance; Total assistance (dependent) (standing)  Clothing Management: Minimum assistance  Cues: Verbal cues provided  Adaptive Equipment: Other (comment) (BSC)    Cognitive Retraining  Safety/Judgement: Awareness of environment; Fall prevention    Instructed patient to increase time sitting OOB in chair for pulmonary hygiene, skin integrity, and to increase endurance in preparation for ADLs. Instructed patient to sit OOB for all meals. Patient verbalized understanding of same.     Instructed pt on pursed lip breathing (PLB) to assist with maintaining oxygen saturations >90% during activity and at rest. Pt instructed to inhale through nose and exhale through mouth while creating "O" shape with verbal, tactile, and visual cues provided to increase clarity and carry-over of technique. Instructed pt to complete focused PLB during rest breaks in order to increase ease while performing functional tasks.     Pain:  Pt reporting minimal pain    Activity Tolerance:   Fair, desaturates with exertion and requires oxygen, requires frequent rest breaks and observed SOB with activity    After treatment patient left in no apparent distress:   Sitting in chair, Call bell within reach and Bed / chair alarm activated    COMMUNICATION/COLLABORATION:   The patient's plan of care was discussed with: Physical therapist and Registered nurse.     Apolinar Junes, OT  Time Calculation: 38 mins

## 2020-09-10 NOTE — Progress Notes (Signed)
 Problem: Mobility Impaired (Adult and Pediatric)  Goal: *Acute Goals and Plan of Care (Insert Text)  Description: FUNCTIONAL STATUS PRIOR TO ADMISSION: Patient was independent and active without use of DME.    HOME SUPPORT PRIOR TO ADMISSION: The patient lived with family but did not require assist.    Physical Therapy Goals  Initiated 09/07/2020  1.  Patient will move from supine to sit and sit to supine , scoot up and down, and roll side to side in bed with independence within 7 day(s).    2.  Patient will transfer from bed to chair and chair to bed with independence using the least restrictive device within 7 day(s).  3.  Patient will perform sit to stand with independence within 7 day(s).  4.  Patient will ambulate with independence for 200 feet with the least restrictive device within 7 day(s).   5.  Patient will ascend/descend 3 stairs with  handrail(s) with modified independence within 7 day(s).     Outcome: Progressing Towards Goal   PHYSICAL THERAPY TREATMENT  Patient: Teresa Allen (44 y.o. female)  Date: 09/10/2020  Diagnosis: COVID-19 [U07.1]   <principal problem not specified>       Precautions:    Chart, physical therapy assessment, plan of care and goals were reviewed.    ASSESSMENT  Patient continues with skilled PT services and is progressing towards goals. Patient initially not agreeable to participating in thearpy due to c/o wanting to eat, urinate, and get up to chair.  PT advised therapy could work with  her on these goals.  Patient's BP and HR closely monitored due to bradycardia and hypotension yesterday and overnight.  However, patient's HR stable in 50s at rest and up to high 70s with mobility.  BP in 110s/50s and patient without symptoms.  Patient requiring only SBA for mobility bed to [facility] and then to chair, required assist for perianal care.  SpO2 remained > 90% throughout session on 25L HFNC.  Declined further therex once finally to chair (extensive time taken for bed to [facility] to  chair).      Current Level of Function Impacting Discharge (mobility/balance): SBA for bed mob, transfers and gait x 5'    Other factors to consider for discharge:          PLAN :  Patient continues to benefit from skilled intervention to address the above impairments.  Continue treatment per established plan of care.  to address goals.    Recommendation for discharge: (in order for the patient to meet his/her long term goals)  To be determined: pumonary rehab vs HH vs none    This discharge recommendation:  Has been made in collaboration with the attending provider and/or case management    IF patient discharges home will need the following DME: to be determined (TBD)       SUBJECTIVE:   Patient stated "I just am so fed up with the food situation and not being able to get hummus"    OBJECTIVE DATA SUMMARY:   Critical Behavior:  Neurologic State: Alert, Appropriate for age  Orientation Level: Oriented X4  Cognition: Appropriate decision making, Appropriate for age attention/concentration, Appropriate safety awareness, Follows commands     Functional Mobility Training:  Bed Mobility:     Supine to Sit: Stand-by assistance  Sit to Supine: Stand-by assistance  Scooting: Stand-by assistance        Transfers:  Sit to Stand: Stand-by assistance  Stand to Sit: Stand-by assistance  Bed to Chair: Stand-by assistance                    Balance:  Sitting: Intact  Standing: Impaired  Standing - Static: Good  Standing - Dynamic : Fair  Ambulation/Gait Training:  Distance (ft): 5 Feet (ft)     Ambulation - Level of Assistance: Stand-by assistance        Gait Abnormalities: Decreased step clearance        Base of Support: Widened                         Pain Rating:  None reported    Activity Tolerance:   Fair, desaturates with exertion and requires oxygen, and requires rest breaks    After treatment patient left in no apparent distress:   Sitting in chair and Call bell within reach    COMMUNICATION/COLLABORATION:   The  patient's plan of care was discussed with: Occupational therapist and Registered nurse.     Alan JINNY Shaggy, PT   Time Calculation: 32 mins

## 2020-09-10 NOTE — Progress Notes (Signed)
This patient was assisted with Intentional Toileting every 2 hours during this shift as appropriate.  Documentation of ambulation and output reflected on Flowsheet as appropriate.  Purposeful hourly rounding was completed using AIDET and 5Ps.  Outcomes of PHR documented as they occurred. Bed alarm in use as appropriate.  Dual Suction and ambubag in place.

## 2020-09-10 NOTE — Progress Notes (Signed)
0700 Bedside and Verbal shift change report given to [facility] RN (oncoming nurse) by Verner Mould (offgoing nurse). Report included the following information SBAR, Kardex, ED Summary, Intake/Output, MAR, Recent Results and Cardiac Rhythm SB.     This patient was assisted with Intentional Toileting every 2 hours during this shift as appropriate.  Documentation of ambulation and output reflected on Flowsheet as appropriate.  Purposeful hourly rounding was completed using AIDET and 5Ps.  Outcomes of PHR documented as they occurred. Bed alarm in use as appropriate.  Dual Suction and ambubag in place.    1000 Notified by Charge RN Dahlia Client and Edmon Crape that Pt's HR in the low 30s, Pacer pads on Pt, Pt is otherwise asymptomatic. Notified Dr. Lorette Ang, orders received to continue monitoring and notify MD if Pt HR is less than 35. ICU RRT RN to come and check on Pt.    1025 RRT called on Pt as HR continues to maintain at 31-35 bpm, BP 116/55, MAP 75. Pt is still asymptomatic other than feeling sluggish. Orders received for multiple Lab draws and Cardiology consulted. Will continue monitoring.      1325 Notified Dr. Lorette Ang of Lab results, no further orders at this time.    1930 Bedside and Verbal shift change report given to Danielle RN (oncoming nurse) by Serita Kyle RN (offgoing nurse). Report included the following information SBAR, Kardex, ED Summary, Intake/Output, MAR, Recent Results and Cardiac Rhythm SB.

## 2020-09-10 NOTE — Consults (Signed)
Consults by  Rushie Chestnut, MD at 09/10/20 1052                Author: Rushie Chestnut, MD  Service: Cardiology  Author Type: Physician       Filed: 09/10/20 1113  Date of Service: 09/10/20 1052  Status: Signed          Editor: Rushie Chestnut, MD (Physician)            Consult Orders        1. IP CONSULT TO CARDIOLOGY [[id number]] ordered by Melynda Keller, MD at 09/10/20 1024                                                      CARDIOLOGY CONSULTATION NOTE      Teresa Krider A. Tahjae Durr, MD, [facility]      [address], [address], [place], [place] [address]   Phone [phone]; Fax [phone]   Mobile [phone]   Voice Mail [phone]                                   09/03/2020 11:25 AM   Primary Care None   DOB:  27-Jun-1976    MRN:  [id number]       CC: Admitted with vomiting and diarrhea and Covid positive on 09/03/2020      Reason for consult: BRADYCARDIA         Admission Diagnosis: COVID-19 [U07.1]             ATTENTION:    This medical record was transcribed using an electronic medical records/speech recognition system.  Although proofread, it may and can contain electronic, spelling and other errors.  Corrections may be executed at a later time.  Please feel free to  contact us for any clarifications as needed.            Impression  Plan/Recommendation     1.     Bradycardia/hypotension-ongoing hypotension since admission   2.  Morbid obesity   3.  Covid positive                  EKG yesterday sinus bradycardia with a QTC of 389 ms   Potassium 3.7, BUN 9 creatinine 0.64, H&H 12 and 38 with plate count 470  1.  Unclear etiology of bradycardia and if asymptomatic may be a result of COVID autonomic dysfunction from damage of the caardiorespiratory  center in the brain stem altering intrinsic cardiac nervous sytem.   2.  Agree with echo   3.  If asymptomatic and appropriate MAP treat conservatively   4.  PRN atropine if symptomatic   5.  Check TSH   6.  IF HR IS ASSOCIATED WITH SXS THEN PLACE ON DOBUTAMINE  BUT NOT NEEDED NOW.      Will see Prn                 PATIENT IS COVID + AND CHART REVEIWED BUT PATIENT NOT SEEN.   Teresa Allen is a 44 y.o. female I am seeing for bradycardia and hypotension.  She was diagnosed with Covid and was experiencing vomiting and diarrhea.  She was admitted 5 days ago.  Initial sats were in the 80s.  She has no prior  cardiac history.   Since admission she has had some increasing shortness of breath at 1 point on 11/28 a rapid response was called and her saturations fell into the 70s and she was bradycardic at 56.  During this admission her heart rate has gone down into the 30s she  has required fluid boluses.      Note that she was tachycardic up until 27 November when she started dropping her her pulse from the 80s 90s down to 56 and has remained persistently bradycardic since.                  No Known Allergies        No past medical history on file.         Past Surgical History:         Procedure  Laterality  Date          ?  HX LUMBAR FUSION                .Home Medications:     None           Hospital Medications:     Current Facility-Administered Medications          Medication  Dose  Route  Frequency           ?  hydrocortisone Sod Succ (PF) (SOLU-CORTEF) injection 50 mg   50 mg  IntraVENous  Q6H     ?  [START ON 09/11/2020] dexamethasone (PF) (DECADRON) 10 mg/mL injection 10 mg   10 mg  IntraVENous  Q12H     ?  midodrine (PROAMATINE) tablet 10 mg   10 mg  Oral  TID WITH MEALS     ?  benzonatate (TESSALON) capsule 100 mg   100 mg  Oral  TID PRN     ?  loperamide (IMODIUM) capsule 2 mg   2 mg  Oral  Q4H PRN           ?  ascorbic acid (vitamin C) (VITAMIN C) tablet 500 mg   500 mg  Oral  BID           ?  zinc sulfate (ZINCATE) 50 mg zinc (220 mg) capsule 1 Capsule   1 Capsule  Oral  DAILY     ?  cholecalciferol (VITAMIN D3) (1000 Units /25 mcg) tablet 1,000 Units   1,000 Units  Oral  DAILY     ?  melatonin (rapid dissolve) tablet 5 mg   5 mg  Oral  QHS     ?  sodium chloride  (NS) flush 5-10 mL   5-10 mL  IntraVENous  PRN     ?  sodium chloride (NS) flush 5-40 mL   5-40 mL  IntraVENous  Q8H     ?  sodium chloride (NS) flush 5-40 mL   5-40 mL  IntraVENous  PRN     ?  acetaminophen (TYLENOL) tablet 650 mg   650 mg  Oral  Q6H PRN          Or           ?  acetaminophen (TYLENOL) suppository 650 mg   650 mg  Rectal  Q6H PRN     ?  polyethylene glycol (MIRALAX) packet 17 g   17 g  Oral  DAILY PRN     ?  enoxaparin (LOVENOX) injection 40 mg   40 mg  SubCUTAneous  Q12H           ?  baricitinib (OLUMIANT) tablet 4 mg   4 mg  Oral  DAILY                 OBJECTIVE           Laboratory and Imaging have been reviewed and are notable for               Diagnostic Tests:       No results for input(s): CPK, CKMB, TROIQ in the last 72 hours.      No lab exists for component: CKQMB, CPKMB     Recent Labs             09/10/20   0505  09/09/20   0257  09/08/20   0613     NA   --   142   --      K   --   4.4   --      CO2   --   27   --      BUN   --   13   --      CREA  0.59  0.58  0.44*          GLU   --   149*   --               Cardiac work up to date:   No specialty comments available.                            Social History:     Social History          Tobacco Use         ?  Smoking status:  Not on file     ?  Smokeless tobacco:  Not on file       Substance Use Topics         ?  Alcohol use:  Not on file           Family History:   No family history on file.      Review of Symptoms:   A comprehensive review of systems was negative except for that written in the HPI.      Physical Exam:        Visit Vitals      BP  (!) 116/55     Pulse  (!) 42     Temp  98.1 ??F (36.7 ??C)     Resp  19     Ht  5\' 2"  (1.575 m)     Wt  350 lb (158.8 kg)     SpO2  97%        BMI  64.02 kg/m??     PATIENT NOT EXAMINED SECONDARY TO COVID =+  I have discussed the diagnosis with the patient  and the intended plan as seen in the above orders.  Questions were answered concerning future plans.  I have discussed medication  side effects and warnings with the patient as well.      Teresa Allen is in agreement to the plan listed above and wishes to proceed.       she  was instructed not to smoke, eat heart healthy diet  and to exercise.       Thank you for the consult and the opportunity to be involved in the care of Teresa Allen.            Rushie Chestnut, MD

## 2020-09-10 NOTE — Progress Notes (Signed)
ICU RN called to ask for assistance as patient remains with pulse under 35 but "asymptomatic" per primary RN.  Danielle ICU RN says Waynetta Sandy will come over to assess.  Dr Lorette Ang aware, advised continue to monitor.

## 2020-09-11 LAB — CREATININE
Creatinine: 0.53 MG/DL — ABNORMAL LOW (ref 0.55–1.02)
Creatinine: 0.53 MG/DL — ABNORMAL LOW (ref 0.55–1.02)
EGFR IF NonAfrican American: >60 mL/min/{1.73_m2} (ref >60)
GFR African American: >60 mL/min/{1.73_m2} (ref >60)
GFR est AA: >60 mL/min/{1.73_m2} (ref >60)
GFR est non-AA: >60 mL/min/{1.73_m2} (ref >60)

## 2020-09-11 MED FILL — MIDODRINE 5 MG TAB: 5 mg | ORAL | Qty: 2

## 2020-09-11 MED FILL — DEXAMETHASONE SODIUM PHOSPHATE (PF) 10 MG/ML INJECTION: 10 mg/mL | INTRAMUSCULAR | Qty: 1

## 2020-09-11 MED FILL — CHOLECALCIFEROL (VITAMIN D3) 1,000 UNIT (25 MCG) TAB: ORAL | Qty: 1

## 2020-09-11 MED FILL — OLUMIANT 2 MG TABLET: 2 mg | ORAL | Qty: 2

## 2020-09-11 MED FILL — ENOXAPARIN 40 MG/0.4 ML SUB-Q SYRINGE: 40 mg/0.4 mL | SUBCUTANEOUS | Qty: 0.4

## 2020-09-11 MED FILL — SOLU-CORTEF ACT-O-VIAL (PF) 100 MG/2 ML SOLUTION FOR INJECTION: 100 mg/2 mL | INTRAMUSCULAR | Qty: 2

## 2020-09-11 MED FILL — ZINC SULFATE 220 MG (50 MG ELEMENTAL ZINC) CAP: 50 mg zinc (220 mg) | ORAL | Qty: 1

## 2020-09-11 MED FILL — MELATONIN 5 MG TAB, RAPID DISSOLVE: 5 mg | ORAL | Qty: 1

## 2020-09-11 MED FILL — ASCORBIC ACID 500 MG TAB: 500 mg | ORAL | Qty: 1

## 2020-09-11 NOTE — Progress Notes (Signed)
Up in chair, watching TV and eating dinner meal brought from home. Denies any resp. Distress. VSS. IV-SL intact. BRP with x1 assist. O2 NC in use as ordered, Sat 98%.

## 2020-09-11 NOTE — Progress Notes (Signed)
2250 - Bedside and verbal shift change report given to [facility], RN (Cabin crew) by Rayfield Citizen, RN (offgoing nurse). Report included the following information: SBAR, Kardex, Intake/Output, MAR, Recent Results, and Med Rec Status.     2357 - Patient's BP 74/41. Dr. Riley Kill notified. Orders received for 1000 mL bolus. In house doctor (Dr. Lum Babe) notified of situation.    4696 - Patient's blood pressure 77/42 after 1000 mL bolus. Dr. Lum Babe notified. Orders received.    This patient was assisted with intentional toileting every 2 hours during this shift as appropriate. Documentation of ambulation and output reflected on flow sheet as appropriate. Purposeful hourly rounding was completed using AIDET and 5 Ps. Outcomes of PHR documented as they occurred. Bed alarm in use as appropriate. Dual suction and ambubag in place.    0700 - Bedside and verbal shift change report given to Angelita, RN (Cabin crew) by Fonda Kinder, RN (offgoing nurse). Report included the following information: SBAR, Kardex, Intake/Output, MAR, Recent Results, and Med Rec Status.

## 2020-09-11 NOTE — Progress Notes (Signed)
This patient was assisted with Intentional Toileting every 2 hours during this shift as appropriate.  Documentation of ambulation and output reflected on Flowsheet as appropriate.  Purposeful hourly rounding was completed using AIDET and 5Ps.  Outcomes of PHR documented as they occurred. Bed alarm in use as appropriate.  Dual Suction and ambubag in place.

## 2020-09-11 NOTE — Progress Notes (Signed)
Report given to [facility], RN. Pt is resting quietly without resp. Distress. See flowsheets. Denies any pain. NSBrady 50's, SBP 80's.

## 2020-09-11 NOTE — Progress Notes (Signed)
Dr. Riley Kill notified of hypotension. Pt is A/Ox4, no c/o further weakness or dizziness.

## 2020-09-11 NOTE — Progress Notes (Signed)
Ambulated to BRP. Voided amber clear urine, BM x1. Felt faint, dizzy, assisted back to bed with W/C. NIBP 86/36, Sat 86%, O2 increased to 4l/m NC. Will monitor.

## 2020-09-11 NOTE — Progress Notes (Signed)
Bedside and Verbal shift change report given to Angelita,RN (oncoming nurse) by Verner Mould (offgoing nurse). Report included the following information SBAR, Kardex, ED Summary, MAR, Recent Results and Cardiac Rhythm SB.     This patient was assisted with Intentional Toileting every 2 hours during this shift as appropriate.  Documentation of ambulation and output reflected on Flowsheet as appropriate.  Purposeful hourly rounding was completed using AIDET and 5Ps.  Outcomes of PHR documented as they occurred. Bed alarm in use as appropriate.  Dual Suction and ambubag in place.    Bedside and Verbal shift change report given to Caroline,RN (oncoming nurse) by Carole Civil (offgoing nurse). Report included the following information SBAR, Kardex, ED Summary, MAR, Recent Results and Cardiac Rhythm SB.

## 2020-09-11 NOTE — Progress Notes (Signed)
Problem: Mobility Impaired (Adult and Pediatric)  Goal: *Acute Goals and Plan of Care (Insert Text)  Description: FUNCTIONAL STATUS PRIOR TO ADMISSION: Patient was independent and active without use of DME.    HOME SUPPORT PRIOR TO ADMISSION: The patient lived with family but did not require assist.    Physical Therapy Goals  Initiated 09/07/2020  1.  Patient will move from supine to sit and sit to supine , scoot up and down, and roll side to side in bed with independence within 7 day(s).    2.  Patient will transfer from bed to chair and chair to bed with independence using the least restrictive device within 7 day(s).  3.  Patient will perform sit to stand with independence within 7 day(s).  4.  Patient will ambulate with independence for 200 feet with the least restrictive device within 7 day(s).   5.  Patient will ascend/descend 3 stairs with  handrail(s) with modified independence within 7 day(s).     Outcome: Progressing Towards Goal   PHYSICAL THERAPY TREATMENT  Patient: Teresa Allen (44 y.o. female)  Date: 09/11/2020  Diagnosis: COVID-19 [U07.1]   <principal problem not specified>       Precautions:    Chart, physical therapy assessment, plan of care and goals were reviewed.    ASSESSMENT  Patient continues with skilled PT services and is progressing towards goals. Patient received asleep but roused, agreeable to PT session with increased activity/gait in room.  Received on 3L (down from 25L yesterday) with sats at rest in mid 90s.  Longer O2 tubing obtained to allow greater distance of movement in room.  Once up to [facility], patient's sats dropped to mid 80s, difficult to recover even with effective PLB.  Titrated O2 up to 5L over the next 5-6 minutes of patient using BSC, to achieve sats > 90%.  After BSC, completed ambulation in room for total distance of 30 feet before patient requested to sit in chair, sats 89/90%.  HR in 50s supine but appropriately increased to 60s and then 70s during activity.  BP  stable throughout as well and patient without c/o or symptoms.  Definitive progress made, will benefit from pushing herself to performing more bouts of short distance gait within the room with staff present to improve activity tolerance.       Current Level of Function Impacting Discharge (mobility/balance): SBA for transfers and gait x 30'    Other factors to consider for discharge:          PLAN :  Patient continues to benefit from skilled intervention to address the above impairments.  Continue treatment per established plan of care.  to address goals.    Recommendation for discharge: (in order for the patient to meet his/her long term goals)  Physical therapy at least 2 days/week in the home vs none    This discharge recommendation:  Has been made in collaboration with the attending provider and/or case management    IF patient discharges home will need the following DME: possibly home O2       SUBJECTIVE:   Patient stated "I hope I can go home for my child's 20th birthday."    OBJECTIVE DATA SUMMARY:   Critical Behavior:  Neurologic State: Alert  Orientation Level: Oriented X4  Cognition: Follows commands  Safety/Judgement: Awareness of environment, Fall prevention  Functional Mobility Training:  Bed Mobility:     Supine to Sit: Supervision  Sit to Supine: Supervision  Scooting: Supervision  Transfers:  Sit to Stand: Stand-by assistance  Stand to Sit: Stand-by assistance        Bed to Chair: Stand-by assistance                    Balance:  Sitting: Intact  Standing: Impaired  Standing - Static: Good  Standing - Dynamic : Fair  Ambulation/Gait Training:  Distance (ft): 30 Feet (ft)     Ambulation - Level of Assistance: Stand-by assistance        Gait Abnormalities: Decreased step clearance        Base of Support: Widened                             Pain Rating:  None reported    Activity Tolerance:   Fair, desaturates with exertion and requires oxygen, and requires rest breaks    After treatment patient  left in no apparent distress:   Sitting in chair, Call bell within reach, and Bed / chair alarm activated    COMMUNICATION/COLLABORATION:   The patient's plan of care was discussed with: Registered nurse and Case management.     Kelvin Cellar, PT   Time Calculation: 28 mins

## 2020-09-11 NOTE — Progress Notes (Signed)
Transition of Care Plan:  RUR-8%; LOS 8 days; covid+  1. Pt admitted with acute respiratory failure with hypoxia 2/2 COVID 19. Pt is followed by the Hospitalist.  2. Pt is currently on heated hi flow at 5L. Pt is on IV steroids.   3. PT eval recommends HH PT and OT eval indicates discharge needs TBD. Lack of insurance is a barrier to [facility] PT. If HH still needed upon discharge, CM will see if [facility] is able to offer visits.  4. Prior to admission, Pt lived at home with her 4 underage children. Pt was independent with ADLs. No services or DME. 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 Pt's sister and brother in law live locally and are a support system for Pt.  5. Will need charity visits for any therapy; Care Fund for any O2 needs for homeO2  6. PRIOR TO DISCHARGE, Pt will need Sondra Barges PCP list as well as Care Card application.  7. Outpatient follow up.  8. Transportation:Family  L.Dorothyann Gibbs, RN

## 2020-09-11 NOTE — Progress Notes (Signed)
Progress Notes by Melynda Keller, MD at 09/11/20 1543                Author: Melynda Keller, MD  Service: Hospitalist  Author Type: Physician       Filed: 09/11/20 1548  Date of Service: 09/11/20 1543  Status: Signed          Editor: Melynda Keller, MD (Physician)                                                                                   [facility]   [address] [place], [place] [address]   [phone]         Medical Progress Note         NAME:         Teresa Allen    DOB:        [birth year]   MRM:        [id number]      Date of service: 09/11/2020        Subjective: Patient has been seen and examined as a follow up for multiple medical issues. Chart, labs, diagnostics reviewed. She remains weak  but better. On much less oxygen today. She denies any chest pain or SOB. No fever        Objective:      Vital Signs:      Visit Vitals      BP  (!) 102/52 (BP 1 Location: Right arm, BP Patient Position: At rest)        Pulse  (!) 51     Temp  98.1 ??F (36.7 ??C)     Resp  21     Ht  5\' 2"  (1.575 m)     Wt  147 kg (324 lb 1.2 oz)     SpO2  98%        BMI  59.27 kg/m??              Intake/Output Summary (Last 24 hours) at 09/11/2020 1543   Last data filed at 09/11/2020 0932     Gross per 24 hour        Intake  240 ml        Output  1300 ml        Net  -1060 ml            Physical Examination:      General:   Weak and ill looking but not in any acute distress     Eyes:   pink conjunctivae, PERRLA with no discharge.   ENT:   no ottorrhea or rhinorrhea with dry mucous membranes   Pulm: decreased breath sounds with scattered crackles. No wheezes   Card:  has regular and bradycardia, without thrills, bruits or peripheral edema   Abd:  Soft, non-tender, non-distended, normoactive bowel sounds    Musc:  No cyanosis, clubbing, atrophy or deformities.   Skin:  No rashes, bruising or ulcers.    Neuro: Awake and alert. Generally a non focal exam.    Psych:  Has a good insight and is  oriented x 3  Current Facility-Administered Medications          Medication  Dose  Route  Frequency           ?  dexamethasone (PF) (DECADRON) 10 mg/mL injection 10 mg   10 mg  IntraVENous  Q12H     ?  midodrine (PROAMATINE) tablet 10 mg   10 mg  Oral  TID WITH MEALS     ?  benzonatate (TESSALON) capsule 100 mg   100 mg  Oral  TID PRN     ?  loperamide (IMODIUM) capsule 2 mg   2 mg  Oral  Q4H PRN     ?  ascorbic acid (vitamin C) (VITAMIN C) tablet 500 mg   500 mg  Oral  BID     ?  zinc sulfate (ZINCATE) 50 mg zinc (220 mg) capsule 1 Capsule   1 Capsule  Oral  DAILY     ?  cholecalciferol (VITAMIN D3) (1000 Units /25 mcg) tablet 1,000 Units   1,000 Units  Oral  DAILY     ?  melatonin (rapid dissolve) tablet 5 mg   5 mg  Oral  QHS     ?  sodium chloride (NS) flush 5-10 mL   5-10 mL  IntraVENous  PRN     ?  sodium chloride (NS) flush 5-40 mL   5-40 mL  IntraVENous  Q8H     ?  sodium chloride (NS) flush 5-40 mL   5-40 mL  IntraVENous  PRN     ?  acetaminophen (TYLENOL) tablet 650 mg   650 mg  Oral  Q6H PRN          Or           ?  acetaminophen (TYLENOL) suppository 650 mg   650 mg  Rectal  Q6H PRN     ?  polyethylene glycol (MIRALAX) packet 17 g   17 g  Oral  DAILY PRN     ?  enoxaparin (LOVENOX) injection 40 mg   40 mg  SubCUTAneous  Q12H           ?  baricitinib (OLUMIANT) tablet 4 mg   4 mg  Oral  DAILY            Laboratory data and review:      No results for input(s): WBC, HGB, HCT, PLT, HGBEXT, HCTEXT, PLTEXT, HGBEXT, HCTEXT, PLTEXT in the last 72 hours.     Recent Labs               09/11/20   0030  09/10/20   1025  09/10/20   0505  09/09/20   0257  09/09/20   0257     NA   --   145   --    --   142     K   --   3.7   --    --   4.4     CL   --   113*   --    --   108     CO2   --   25   --    --   27     GLU   --   129*   --    --   149*     BUN   --   9   --    --   13     CREA  0.53*  0.46*  0.59    < >  0.58     CA   --   7.2*   --    --   8.2*     MG   --   1.9   --    --    --      ALB   --   2.2*    --    --   2.1*     ALT   --   137*   --    --   144*        < > = values in this interval not displayed.        No components found for: [facility]      Diagnostics: Imaging studies have been reviewed      Telemetry reviewed by me:   normal sinus rhythm      Assessment and Plan:      Acute respiratory failure with hypoxia (HCC) (09/03/2020) POA: due to Co-V-19 viral infection. Now down to 2L supplemental oxygen. Monitor overnight. If she does remain stable, consider discharge in AM.       Pneumonia due to COVID-19 virus (09/03/2020) / Sepsis POA: she is unvaccinated and now with recovering severe disease. CTA neg for PE but multifocal disease. Inflammatory markers improving. Continue Dexamethasone, Baricitinib and enoxaparin. Incetive  spirometry. Continue to follow      Bradycardia, sinus POA: she has no symptoms. 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 Echo showed a normal LV function. TSH was normal. Seen by Cardiology. Monitor        Elevated liver enzymes (09/03/2020) POA: likely from Co-V 19. Trending down. Monitor         Morbid obesity:??Body mass index is 58.53 kg/m??.??RF for severe COVID.??Would benefit from weight loss and dietary / lifestyle modifications   ??   Total time spent for the patient's care: 25  Minutes                    Care Plan discussed with: Patient, Care Manager and Nursing Staff      Discussed:  Care Plan and D/C Planning      Prophylaxis:  Lovenox      Anticipated Disposition:  Home w/Family             ___________________________________________________      Attending Physician:   Melynda Keller, MD

## 2020-09-11 NOTE — Progress Notes (Signed)
Report given to Scherry Ran     Notified that PT is a Daily weight

## 2020-09-12 ENCOUNTER — Inpatient Hospital Stay: Admit: 2020-09-12 | Payer: BLUE CROSS/BLUE SHIELD | Primary: Family Medicine

## 2020-09-12 LAB — CBC WITH AUTO DIFFERENTIAL
Band Neutrophils: 2 % (ref 0–6)
Basophils %: 0 % (ref 0–1)
Basophils Absolute: 0 10*3/uL (ref 0.0–0.1)
Eosinophils %: 0 % (ref 0–7)
Eosinophils Absolute: 0 10*3/uL (ref 0.0–0.4)
Granulocyte Absolute Count: 0 10*3/uL
Hematocrit: 37.4 % (ref 35.0–47.0)
Hemoglobin: 12.3 g/dL (ref 11.5–16.0)
Immature Granulocytes: 0 %
Lymphocytes %: 13 % (ref 12–49)
Lymphocytes Absolute: 1.3 10*3/uL (ref 0.8–3.5)
MCH: 31.5 PG (ref 26.0–34.0)
MCHC: 32.9 g/dL (ref 30.0–36.5)
MCV: 95.7 FL (ref 80.0–99.0)
MPV: 9.9 FL (ref 8.9–12.9)
Metamyelocytes Relative: 3 % — ABNORMAL HIGH
Monocytes %: 3 % — ABNORMAL LOW (ref 5–13)
Monocytes Absolute: 0.3 10*3/uL (ref 0.0–1.0)
NRBC Absolute: 0 10*3/uL (ref 0.00–0.01)
Neutrophils %: 79 % — ABNORMAL HIGH (ref 32–75)
Neutrophils Absolute: 7.9 10*3/uL (ref 1.8–8.0)
Nucleated RBCs: 0 PER 100 WBC
Platelets: 483 10*3/uL — ABNORMAL HIGH (ref 150–400)
RBC: 3.91 M/uL (ref 3.80–5.20)
RDW: 12.9 % (ref 11.5–14.5)
WBC: 9.7 10*3/uL (ref 3.6–11.0)

## 2020-09-12 LAB — COMPREHENSIVE METABOLIC PANEL
ALT: 94 U/L — ABNORMAL HIGH (ref 12–78)
AST: 19 U/L (ref 15–37)
Albumin/Globulin Ratio: 0.9 — ABNORMAL LOW (ref 1.1–2.2)
Albumin: 2.6 g/dL — ABNORMAL LOW (ref 3.5–5.0)
Alkaline Phosphatase: 51 U/L (ref 45–117)
Anion Gap: 6 mmol/L (ref 5–15)
BUN: 14 MG/DL (ref 6–20)
Bun/Cre Ratio: 24 — ABNORMAL HIGH (ref 12–20)
CO2: 26 mmol/L (ref 21–32)
Calcium: 7.7 MG/DL — ABNORMAL LOW (ref 8.5–10.1)
Chloride: 110 mmol/L — ABNORMAL HIGH (ref 97–108)
Creatinine: 0.58 MG/DL (ref 0.55–1.02)
EGFR IF NonAfrican American: >60 mL/min/{1.73_m2} (ref >60)
GFR African American: >60 mL/min/{1.73_m2} (ref >60)
Globulin: 2.9 g/dL (ref 2.0–4.0)
Glucose: 161 mg/dL — ABNORMAL HIGH (ref 65–100)
Potassium: 4.8 mmol/L (ref 3.5–5.1)
Sodium: 142 mmol/L (ref 136–145)
Total Bilirubin: 0.2 MG/DL (ref 0.2–1.0)
Total Protein: 5.5 g/dL — ABNORMAL LOW (ref 6.4–8.2)

## 2020-09-12 LAB — PROCALCITONIN
Procalcitonin: <0.05 ng/mL
Procalcitonin: <0.05 ng/mL

## 2020-09-12 LAB — D-DIMER, QUANTITATIVE: D-Dimer, Quant: 1.25 mg/L FEU — ABNORMAL HIGH (ref 0.00–0.65)

## 2020-09-12 LAB — METABOLIC PANEL, COMPREHENSIVE
A-G Ratio: 0.9 — ABNORMAL LOW (ref 1.1–2.2)
ALT (SGPT): 94 U/L — ABNORMAL HIGH (ref 12–78)
AST (SGOT): 19 U/L (ref 15–37)
Albumin: 2.6 g/dL — ABNORMAL LOW (ref 3.5–5.0)
Alk. phosphatase: 51 U/L (ref 45–117)
Anion gap: 6 mmol/L (ref 5–15)
BUN/Creatinine ratio: 24 — ABNORMAL HIGH (ref 12–20)
BUN: 14 MG/DL (ref 6–20)
Bilirubin, total: 0.2 MG/DL (ref 0.2–1.0)
CO2: 26 mmol/L (ref 21–32)
Calcium: 7.7 MG/DL — ABNORMAL LOW (ref 8.5–10.1)
Chloride: 110 mmol/L — ABNORMAL HIGH (ref 97–108)
Creatinine: 0.58 MG/DL (ref 0.55–1.02)
GFR est AA: >60 mL/min/{1.73_m2} (ref >60)
GFR est non-AA: >60 mL/min/{1.73_m2} (ref >60)
Globulin: 2.9 g/dL (ref 2.0–4.0)
Glucose: 161 mg/dL — ABNORMAL HIGH (ref 65–100)
Potassium: 4.8 mmol/L (ref 3.5–5.1)
Protein, total: 5.5 g/dL — ABNORMAL LOW (ref 6.4–8.2)
Sodium: 142 mmol/L (ref 136–145)

## 2020-09-12 LAB — CBC WITH AUTOMATED DIFF
ABS. BASOPHILS: 0 10*3/uL (ref 0.0–0.1)
ABS. EOSINOPHILS: 0 10*3/uL (ref 0.0–0.4)
ABS. IMM. GRANS.: 0 10*3/uL
ABS. LYMPHOCYTES: 1.3 10*3/uL (ref 0.8–3.5)
ABS. MONOCYTES: 0.3 10*3/uL (ref 0.0–1.0)
ABS. NEUTROPHILS: 7.9 10*3/uL (ref 1.8–8.0)
ABSOLUTE NRBC: 0 10*3/uL (ref 0.00–0.01)
BAND NEUTROPHILS: 2 % (ref 0–6)
BASOPHILS: 0 % (ref 0–1)
EOSINOPHILS: 0 % (ref 0–7)
HCT: 37.4 % (ref 35.0–47.0)
HGB: 12.3 g/dL (ref 11.5–16.0)
IMMATURE GRANULOCYTES: 0 %
LYMPHOCYTES: 13 % (ref 12–49)
MCH: 31.5 PG (ref 26.0–34.0)
MCHC: 32.9 g/dL (ref 30.0–36.5)
MCV: 95.7 FL (ref 80.0–99.0)
METAMYELOCYTES: 3 % — ABNORMAL HIGH
MONOCYTES: 3 % — ABNORMAL LOW (ref 5–13)
MPV: 9.9 FL (ref 8.9–12.9)
NEUTROPHILS: 79 % — ABNORMAL HIGH (ref 32–75)
NRBC: 0 PER 100 WBC
PLATELET: 483 10*3/uL — ABNORMAL HIGH (ref 150–400)
RBC: 3.91 M/uL (ref 3.80–5.20)
RDW: 12.9 % (ref 11.5–14.5)
WBC: 9.7 10*3/uL (ref 3.6–11.0)

## 2020-09-12 LAB — D DIMER: D-dimer: 1.25 mg/L FEU — ABNORMAL HIGH (ref 0.00–0.65)

## 2020-09-12 MED ORDER — ALBUMIN, HUMAN 25 % IV
25 % | Freq: Once | INTRAVENOUS | Status: DC
Start: 2020-09-12 — End: 2020-09-12

## 2020-09-12 MED ORDER — SODIUM CHLORIDE 0.9% BOLUS IV
0.9 % | Freq: Once | INTRAVENOUS | Status: AC
Start: 2020-09-12 — End: 2020-09-12
  Administered 2020-09-12: 06:00:00 via INTRAVENOUS

## 2020-09-12 MED ORDER — ALBUMIN, HUMAN 25 % IV
25 % | Freq: Once | INTRAVENOUS | Status: AC
Start: 2020-09-12 — End: 2020-09-12
  Administered 2020-09-12: 09:00:00 via INTRAVENOUS

## 2020-09-12 MED ORDER — SODIUM CHLORIDE 0.9% BOLUS IV
0.9 % | Freq: Once | INTRAVENOUS | Status: AC
Start: 2020-09-12 — End: 2020-09-12
  Administered 2020-09-12: 05:00:00 via INTRAVENOUS

## 2020-09-12 MED FILL — DEXAMETHASONE SODIUM PHOSPHATE (PF) 10 MG/ML INJECTION: 10 mg/mL | INTRAMUSCULAR | Qty: 1

## 2020-09-12 MED FILL — SODIUM CHLORIDE 0.9 % IV: INTRAVENOUS | Qty: 1000

## 2020-09-12 MED FILL — OLUMIANT 2 MG TABLET: 2 mg | ORAL | Qty: 2

## 2020-09-12 MED FILL — MIDODRINE 5 MG TAB: 5 mg | ORAL | Qty: 2

## 2020-09-12 MED FILL — BENZONATATE 100 MG CAP: 100 mg | ORAL | Qty: 1

## 2020-09-12 MED FILL — CHOLECALCIFEROL (VITAMIN D3) 1,000 UNIT (25 MCG) TAB: ORAL | Qty: 1

## 2020-09-12 MED FILL — ASCORBIC ACID 500 MG TAB: 500 mg | ORAL | Qty: 1

## 2020-09-12 MED FILL — SODIUM CHLORIDE 0.9 % IV: INTRAVENOUS | Qty: 500

## 2020-09-12 MED FILL — ENOXAPARIN 40 MG/0.4 ML SUB-Q SYRINGE: 40 mg/0.4 mL | SUBCUTANEOUS | Qty: 0.4

## 2020-09-12 MED FILL — ZINC SULFATE 220 MG (50 MG ELEMENTAL ZINC) CAP: 50 mg zinc (220 mg) | ORAL | Qty: 1

## 2020-09-12 MED FILL — FLEXBUMIN 25 % INTRAVENOUS SOLUTION: 25 % | INTRAVENOUS | Qty: 200

## 2020-09-12 MED FILL — MELATONIN 5 MG TAB, RAPID DISSOLVE: 5 mg | ORAL | Qty: 1

## 2020-09-12 MED FILL — ACETAMINOPHEN 325 MG TABLET: 325 mg | ORAL | Qty: 2

## 2020-09-12 NOTE — Progress Notes (Signed)
Called about patient hypotension.  Patient blood pressure borderline at baseline on midodrine because of that.  Patient received fluid bolus and one-time dose of albumin for the hypotension.  Patient is asymptomatic and will continue to monitor.

## 2020-09-12 NOTE — Progress Notes (Signed)
This patient was assisted with Intentional Toileting every 2 hours during this shift as appropriate.  Documentation of ambulation and output reflected on Flowsheet as appropriate.  Purposeful hourly rounding was completed using AIDET and 5Ps.  Outcomes of PHR documented as they occurred. Bed alarm in use as appropriate.  Dual Suction and ambubag in place.

## 2020-09-12 NOTE — Progress Notes (Signed)
Progress  Notes by Horald Chestnut, DO at 09/12/20 1246                Author: Horald Chestnut, DO  Service: Internal Medicine  Author Type: Physician       Filed: 09/12/20 1251  Date of Service: 09/12/20 1246  Status: Signed          Editor: Horald Chestnut, DO (Physician)                                                                                   [facility]   [address] [place], [place] [address]   [phone]         Medical Progress Note         NAME:         Teresa Allen    DOB:        [birth year]   MRM:        [id number]      Date of service: 09/12/2020        Subjective: Patient has been seen and examined as a follow up for multiple medical issues. Chart, labs, diagnostics reviewed. Hypotensive overnight.  She denies any chest pain or SOB. Some light headedness.  Encourage patient to use incentive spirometry.      Objective:      Vital Signs:      Visit Vitals      BP  (!) 107/52 (BP 1 Location: Left upper arm, BP Patient Position: Sitting)        Pulse  64     Temp  98.2 ??F (36.8 ??C)     Resp  18     Ht  5\' 2"  (1.575 m)     Wt  (!) 159.5 kg (351 lb 11.2 oz)     SpO2  93%        BMI  64.33 kg/m??              Intake/Output Summary (Last 24 hours) at 09/12/2020 1246   Last data filed at 09/12/2020 0401     Gross per 24 hour        Intake  480 ml        Output  500 ml        Net  -20 ml            Physical Examination:      General:   Weak and ill looking but not in any acute distress     Eyes:   pink conjunctivae, PERRLA with no discharge.   ENT:   no ottorrhea or rhinorrhea with dry mucous membranes   Pulm: decreased breath sounds with scattered crackles. No wheezes   Card:  has regular and bradycardia, without thrills, bruits or peripheral edema   Abd:  Soft, non-tender, non-distended, normoactive bowel sounds    Musc:  No cyanosis, clubbing, atrophy or deformities.   Skin:  No rashes, bruising or ulcers.    Neuro: Awake and alert. Generally a non focal exam.    Psych:   Has a good insight and is oriented x 3  Current Facility-Administered Medications          Medication  Dose  Route  Frequency           ?  dexamethasone (PF) (DECADRON) 10 mg/mL injection 10 mg   10 mg  IntraVENous  Q12H     ?  midodrine (PROAMATINE) tablet 10 mg   10 mg  Oral  TID WITH MEALS     ?  benzonatate (TESSALON) capsule 100 mg   100 mg  Oral  TID PRN     ?  loperamide (IMODIUM) capsule 2 mg   2 mg  Oral  Q4H PRN     ?  ascorbic acid (vitamin C) (VITAMIN C) tablet 500 mg   500 mg  Oral  BID     ?  zinc sulfate (ZINCATE) 50 mg zinc (220 mg) capsule 1 Capsule   1 Capsule  Oral  DAILY     ?  cholecalciferol (VITAMIN D3) (1000 Units /25 mcg) tablet 1,000 Units   1,000 Units  Oral  DAILY     ?  melatonin (rapid dissolve) tablet 5 mg   5 mg  Oral  QHS     ?  sodium chloride (NS) flush 5-10 mL   5-10 mL  IntraVENous  PRN     ?  sodium chloride (NS) flush 5-40 mL   5-40 mL  IntraVENous  Q8H     ?  sodium chloride (NS) flush 5-40 mL   5-40 mL  IntraVENous  PRN     ?  acetaminophen (TYLENOL) tablet 650 mg   650 mg  Oral  Q6H PRN          Or           ?  acetaminophen (TYLENOL) suppository 650 mg   650 mg  Rectal  Q6H PRN     ?  polyethylene glycol (MIRALAX) packet 17 g   17 g  Oral  DAILY PRN     ?  enoxaparin (LOVENOX) injection 40 mg   40 mg  SubCUTAneous  Q12H           ?  baricitinib (OLUMIANT) tablet 4 mg   4 mg  Oral  DAILY            Laboratory data and review:        Recent Labs           09/12/20   0520     WBC  9.7     HGB  12.3     HCT  37.4        PLT  483*          Recent Labs             09/12/20   0520  09/11/20   0030  09/10/20   1025     NA  142   --   145     K  4.8   --   3.7     CL  110*   --   113*     CO2  26   --   25     GLU  161*   --   129*     BUN  14   --   9     CREA  0.58  0.53*  0.46*     CA  7.7*   --   7.2*     MG   --    --  1.9     ALB  2.6*   --   2.2*          ALT  94*   --   137*        No components found for: [facility]      Diagnostics: Imaging studies have been reviewed       Assessment and Plan:      Hypotension: Possibly automatic dysfunction due to Covid.  Status post normal saline bolus and albumin.  Check D-dimer.  Check procalcitonin.  Check UA.  Recheck chest x-ray.  Continue midodrine.  Monitor.      Acute respiratory failure with hypoxia (HCC) (09/03/2020) POA: due to Co-V-19 viral infection.  Currently on 4 L supplemental oxygen; wean as tolerated.  O2 ambulatory eval prior to discharge anticipate patient needing O2 on discharge.      Pneumonia due to COVID-19 virus (09/03/2020) / Sepsis POA: she is unvaccinated and now with recovering severe disease. CTA neg for PE but multifocal disease.  Monitor inflammatory markers. Continue Dexamethasone, Baricitinib and enoxaparin. Incetive spirometry.  Continue to follow      Bradycardia, sinus POA: she has no symptoms. 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 Echo showed a normal LV function. TSH was normal. Seen by Cardiology. Monitor        Elevated liver enzymes (09/03/2020) POA: likely from Co-V 19. Trending down. Monitor         Morbid obesity:??Body mass index is 58.53 kg/m??.??RF for severe COVID.??Would benefit from weight loss and dietary / lifestyle modifications   ??   Total time spent for the patient's care: 31  Minutes                    Care Plan discussed with: Patient, Care Manager and Nursing Staff      Discussed:  Care Plan and D/C Planning      Prophylaxis:  Lovenox      Anticipated Disposition:  Home w/Family             ___________________________________________________      Attending Physician:   Horald Chestnut, DO

## 2020-09-12 NOTE — Progress Notes (Signed)
Bedside and Verbal shift change report given to Angelita,RN (oncoming nurse) by Howell Rucks (offgoing nurse). Report included the following information SBAR, Kardex, ED Summary, MAR, Recent Results and Cardiac Rhythm SB.    This patient was assisted with Intentional Toileting every 2 hours during this shift as appropriate.  Documentation of ambulation and output reflected on Flowsheet as appropriate.  Purposeful hourly rounding was completed using AIDET and 5Ps.  Outcomes of PHR documented as they occurred. Bed alarm in use as appropriate.  Dual Suction and ambubag in place.    Bedside and Verbal shift change report given to Danielle,RN (oncoming nurse) by Carole Civil (offgoing nurse). Report included the following information SBAR, Kardex, ED Summary, MAR, Recent Results and Cardiac Rhythm SB.

## 2020-09-13 ENCOUNTER — Inpatient Hospital Stay: Payer: BLUE CROSS/BLUE SHIELD | Primary: Family Medicine

## 2020-09-13 LAB — COMPREHENSIVE METABOLIC PANEL
ALT: 85 U/L — ABNORMAL HIGH (ref 12–78)
AST: 16 U/L (ref 15–37)
Albumin/Globulin Ratio: 0.8 — ABNORMAL LOW (ref 1.1–2.2)
Albumin: 2.7 g/dL — ABNORMAL LOW (ref 3.5–5.0)
Alkaline Phosphatase: 50 U/L (ref 45–117)
Anion Gap: 5 mmol/L (ref 5–15)
BUN: 8 MG/DL (ref 6–20)
Bun/Cre Ratio: 17 (ref 12–20)
CO2: 28 mmol/L (ref 21–32)
Calcium: 8.4 MG/DL — ABNORMAL LOW (ref 8.5–10.1)
Chloride: 108 mmol/L (ref 97–108)
Creatinine: 0.48 MG/DL — ABNORMAL LOW (ref 0.55–1.02)
EGFR IF NonAfrican American: >60 mL/min/{1.73_m2} (ref >60)
GFR African American: >60 mL/min/{1.73_m2} (ref >60)
Globulin: 3.3 g/dL (ref 2.0–4.0)
Glucose: 176 mg/dL — ABNORMAL HIGH (ref 65–100)
Potassium: 4.2 mmol/L (ref 3.5–5.1)
Sodium: 141 mmol/L (ref 136–145)
Total Bilirubin: 0.3 MG/DL (ref 0.2–1.0)
Total Protein: 6 g/dL — ABNORMAL LOW (ref 6.4–8.2)

## 2020-09-13 LAB — CBC WITH AUTO DIFFERENTIAL
Basophils %: 0 % (ref 0–1)
Basophils Absolute: 0 10*3/uL (ref 0.0–0.1)
Eosinophils %: 0 % (ref 0–7)
Eosinophils Absolute: 0 10*3/uL (ref 0.0–0.4)
Granulocyte Absolute Count: 0.8 10*3/uL — ABNORMAL HIGH (ref 0.00–0.04)
Hematocrit: 36.1 % (ref 35.0–47.0)
Hemoglobin: 11.8 g/dL (ref 11.5–16.0)
Immature Granulocytes: 6 % — ABNORMAL HIGH (ref 0.0–0.5)
Lymphocytes %: 5 % — ABNORMAL LOW (ref 12–49)
Lymphocytes Absolute: 0.7 10*3/uL — ABNORMAL LOW (ref 0.8–3.5)
MCH: 30.4 PG (ref 26.0–34.0)
MCHC: 32.7 g/dL (ref 30.0–36.5)
MCV: 93 FL (ref 80.0–99.0)
MPV: 9.9 FL (ref 8.9–12.9)
Monocytes %: 2 % — ABNORMAL LOW (ref 5–13)
Monocytes Absolute: 0.3 10*3/uL (ref 0.0–1.0)
NRBC Absolute: 0 10*3/uL (ref 0.00–0.01)
Neutrophils %: 87 % — ABNORMAL HIGH (ref 32–75)
Neutrophils Absolute: 11.9 10*3/uL — ABNORMAL HIGH (ref 1.8–8.0)
Nucleated RBCs: 0 PER 100 WBC
Platelets: 502 10*3/uL — ABNORMAL HIGH (ref 150–400)
RBC: 3.88 M/uL (ref 3.80–5.20)
RDW: 12.6 % (ref 11.5–14.5)
WBC Comment: REACTIVE
WBC: 13.7 10*3/uL — ABNORMAL HIGH (ref 3.6–11.0)

## 2020-09-13 LAB — URINALYSIS W/ REFLEX CULTURE
BACTERIA, URINE: NEGATIVE /hpf
Bacteria: NEGATIVE /hpf
Bilirubin, Urine: NEGATIVE
Bilirubin: NEGATIVE
Blood, Urine: NEGATIVE
Blood: NEGATIVE
Glucose, Ur: NEGATIVE mg/dL
Glucose: NEGATIVE mg/dL
Ketone: NEGATIVE mg/dL
Ketones, Urine: NEGATIVE mg/dL
Leukocyte Esterase, Urine: NEGATIVE
Leukocyte Esterase: NEGATIVE
Nitrite, Urine: NEGATIVE
Nitrites: NEGATIVE
Protein, UA: NEGATIVE mg/dL
Protein: NEGATIVE mg/dL
Specific Gravity, UA: 1.014 (ref 1.003–1.030)
Specific gravity: 1.014 (ref 1.003–1.030)
Urobilinogen, UA, POCT: 1 EU/dL (ref 0.2–1.0)
Urobilinogen: 1 EU/dL (ref 0.2–1.0)
pH (UA): 7 (ref 5.0–8.0)
pH, UA: 7 (ref 5.0–8.0)

## 2020-09-13 LAB — METABOLIC PANEL, COMPREHENSIVE
A-G Ratio: 0.8 — ABNORMAL LOW (ref 1.1–2.2)
ALT (SGPT): 85 U/L — ABNORMAL HIGH (ref 12–78)
AST (SGOT): 16 U/L (ref 15–37)
Albumin: 2.7 g/dL — ABNORMAL LOW (ref 3.5–5.0)
Alk. phosphatase: 50 U/L (ref 45–117)
Anion gap: 5 mmol/L (ref 5–15)
BUN/Creatinine ratio: 17 (ref 12–20)
BUN: 8 MG/DL (ref 6–20)
Bilirubin, total: 0.3 MG/DL (ref 0.2–1.0)
CO2: 28 mmol/L (ref 21–32)
Calcium: 8.4 MG/DL — ABNORMAL LOW (ref 8.5–10.1)
Chloride: 108 mmol/L (ref 97–108)
Creatinine: 0.48 MG/DL — ABNORMAL LOW (ref 0.55–1.02)
GFR est AA: >60 mL/min/{1.73_m2} (ref >60)
GFR est non-AA: >60 mL/min/{1.73_m2} (ref >60)
Globulin: 3.3 g/dL (ref 2.0–4.0)
Glucose: 176 mg/dL — ABNORMAL HIGH (ref 65–100)
Potassium: 4.2 mmol/L (ref 3.5–5.1)
Protein, total: 6 g/dL — ABNORMAL LOW (ref 6.4–8.2)
Sodium: 141 mmol/L (ref 136–145)

## 2020-09-13 LAB — CBC WITH AUTOMATED DIFF
ABS. BASOPHILS: 0 10*3/uL (ref 0.0–0.1)
ABS. EOSINOPHILS: 0 10*3/uL (ref 0.0–0.4)
ABS. IMM. GRANS.: 0.8 10*3/uL — ABNORMAL HIGH (ref 0.00–0.04)
ABS. LYMPHOCYTES: 0.7 10*3/uL — ABNORMAL LOW (ref 0.8–3.5)
ABS. MONOCYTES: 0.3 10*3/uL (ref 0.0–1.0)
ABS. NEUTROPHILS: 11.9 10*3/uL — ABNORMAL HIGH (ref 1.8–8.0)
ABSOLUTE NRBC: 0 10*3/uL (ref 0.00–0.01)
BASOPHILS: 0 % (ref 0–1)
EOSINOPHILS: 0 % (ref 0–7)
HCT: 36.1 % (ref 35.0–47.0)
HGB: 11.8 g/dL (ref 11.5–16.0)
IMMATURE GRANULOCYTES: 6 % — ABNORMAL HIGH (ref 0.0–0.5)
LYMPHOCYTES: 5 % — ABNORMAL LOW (ref 12–49)
MCH: 30.4 PG (ref 26.0–34.0)
MCHC: 32.7 g/dL (ref 30.0–36.5)
MCV: 93 FL (ref 80.0–99.0)
MONOCYTES: 2 % — ABNORMAL LOW (ref 5–13)
MPV: 9.9 FL (ref 8.9–12.9)
NEUTROPHILS: 87 % — ABNORMAL HIGH (ref 32–75)
NRBC: 0 PER 100 WBC
PLATELET: 502 10*3/uL — ABNORMAL HIGH (ref 150–400)
RBC: 3.88 M/uL (ref 3.80–5.20)
RDW: 12.6 % (ref 11.5–14.5)
WBC COMMENTS: REACTIVE
WBC: 13.7 10*3/uL — ABNORMAL HIGH (ref 3.6–11.0)

## 2020-09-13 MED ORDER — DEXAMETHASONE SODIUM PHOSPHATE (PF) 10 MG/ML INJECTION
10 mg/mL | INTRAMUSCULAR | Status: DC
Start: 2020-09-13 — End: 2020-09-16
  Administered 2020-09-14 – 2020-09-15 (×2): via INTRAVENOUS

## 2020-09-13 MED FILL — DEXAMETHASONE SODIUM PHOSPHATE (PF) 10 MG/ML INJECTION: 10 mg/mL | INTRAMUSCULAR | Qty: 1

## 2020-09-13 MED FILL — MIDODRINE 5 MG TAB: 5 mg | ORAL | Qty: 2

## 2020-09-13 MED FILL — OLUMIANT 2 MG TABLET: 2 mg | ORAL | Qty: 2

## 2020-09-13 MED FILL — ENOXAPARIN 40 MG/0.4 ML SUB-Q SYRINGE: 40 mg/0.4 mL | SUBCUTANEOUS | Qty: 0.4

## 2020-09-13 MED FILL — ASCORBIC ACID 500 MG TAB: 500 mg | ORAL | Qty: 1

## 2020-09-13 MED FILL — ZINC SULFATE 220 MG (50 MG ELEMENTAL ZINC) CAP: 50 mg zinc (220 mg) | ORAL | Qty: 1

## 2020-09-13 MED FILL — BENZONATATE 100 MG CAP: 100 mg | ORAL | Qty: 1

## 2020-09-13 MED FILL — CHOLECALCIFEROL (VITAMIN D3) 1,000 UNIT (25 MCG) TAB: ORAL | Qty: 1

## 2020-09-13 MED FILL — ACETAMINOPHEN 325 MG TABLET: 325 mg | ORAL | Qty: 2

## 2020-09-13 MED FILL — MELATONIN 5 MG TAB, RAPID DISSOLVE: 5 mg | ORAL | Qty: 1

## 2020-09-13 NOTE — Progress Notes (Signed)
1900 - Bedside and verbal shift change report given to [facility], RN (Cabin crew) by April, RN (offgoing nurse). Report included the following information: SBAR, Kardex, Intake/Output, MAR, Recent Results, and Med Rec Status.     This patient was assisted with intentional toileting every 2 hours during this shift as appropriate. Documentation of ambulation and output reflected on flow sheet as appropriate. Purposeful hourly rounding was completed using AIDET and 5 Ps. Outcomes of PHR documented as they occurred. Bed alarm in use as appropriate. Dual suction and ambubag in place.    0700 - Bedside and verbal shift change report given to Cala Bradford, Charity fundraiser (oncoming nurse) by Fonda Kinder, RN (offgoing nurse). Report included the following information: SBAR, Kardex, Intake/Output, MAR, Recent Results, and Med Rec Status.

## 2020-09-13 NOTE — Progress Notes (Signed)
0145: Bedside and Verbal shift change report given to Leotis Shames, RN (oncoming nurse) by Duwayne Heck, RN (offgoing nurse). Report included the following information SBAR, Kardex, Intake/Output, MAR and Recent Results.     This patient was assisted with Intentional Toileting every 2 hours during this shift as appropriate.  Documentation of ambulation and output reflected on Flowsheet as appropriate.  Purposeful hourly rounding was completed using AIDET and 5Ps.  Outcomes of PHR documented as they occurred. Bed alarm in use as appropriate.  Dual Suction and ambubag in place.    Bedside and Verbal shift change report given to April, RN (oncoming nurse) by Leotis Shames, RN (offgoing nurse). Report included the following information SBAR, Kardex, Intake/Output, MAR and Recent Results.

## 2020-09-13 NOTE — Progress Notes (Signed)
0730 Bedside and Verbal shift change report given to April RN (oncoming nurse) by Leotis Shames RN (offgoing nurse). Report included the following information SBAR and Kardex.       This patient was assisted with Intentional Toileting every 2 hours during this shift as appropriate.  Documentation of ambulation and output reflected on Flowsheet as appropriate.  Purposeful hourly rounding was completed using AIDET and 5Ps.  Outcomes of PHR documented as they occurred. Bed alarm in use as appropriate.  Dual Suction and ambubag in place.    1723 Patient states would get CT tomorrow, declined to complete today.    1915 Bedside and Verbal shift change report given to [facility] RN (oncoming nurse) by April RN (offgoing nurse). Report included the following information SBAR and Kardex.

## 2020-09-13 NOTE — Progress Notes (Signed)
Bedside report given to Lenna Sciara. RN in SBAR fashion. Last VS were reviewed, opportunities for questions were given. Patient is in sinus brady, BP 94/43, HR 51.

## 2020-09-13 NOTE — Progress Notes (Signed)
Pt came down for a ct chest w/o contast today but refused to get out of the chair for the exam.  Jamas Lav.T.The floor nurse was called while the patient was in the room. The nurse understood the pt condition and said to return her to the floor if she refuses. Richard Derenthal R.T.

## 2020-09-13 NOTE — Progress Notes (Signed)
This patient was assisted with Intentional Toileting every 2 hours during this shift as appropriate.  Documentation of ambulation and output reflected on Flowsheet as appropriate.  Purposeful hourly rounding was completed using AIDET and 5Ps.  Outcomes of PHR documented as they occurred. Bed alarm in use as appropriate.  Dual Suction and ambubag in place.

## 2020-09-13 NOTE — Progress Notes (Signed)
Progress  Notes by Horald Chestnut, DO at 09/13/20 1122                Author: Horald Chestnut, DO  Service: Internal Medicine  Author Type: Physician       Filed: 09/13/20 1130  Date of Service: 09/13/20 1122  Status: Addendum          Editor: Horald Chestnut, DO (Physician)          Related Notes: Original Note by Horald Chestnut, DO (Physician) filed at 09/13/20 1129                                                                                   [facility]   [address] [place], [place], [place] [address]   [phone]         Medical Progress Note         NAME:         Teresa Allen    DOB:        10-09-1976   MRM:        [id number]      Date of service: 09/13/2020        Subjective: Patient has been seen and examined as a follow up for multiple medical issues. Chart, labs, diagnostics reviewed. Improved hypotension.  She denies any chest pain, sob or abd pain. No dysuria. Encouraged patient to use incentive spirometry.      Objective:      Vital Signs:      Visit Vitals      BP  108/60 (BP 1 Location: Left lower arm, BP Patient Position: At rest)        Pulse  60     Temp  97.9 ??F (36.6 ??C)     Resp  22     Ht  5\' 2"  (1.575 m)     Wt  152 kg (335 lb)     SpO2  94%        BMI  61.27 kg/m??              Intake/Output Summary (Last 24 hours) at 09/13/2020 1122   Last data filed at 09/13/2020 1008     Gross per 24 hour        Intake  960 ml        Output  400 ml        Net  560 ml            Physical Examination:      General:  not in any acute distress     Eyes:   pink conjunctivae, PERRLA with no discharge.   ENT:   no ottorrhea or rhinorrhea with dry mucous membranes   Pulm: decreased breath sounds b/l. No wheezes   Card:  has regular and bradycardia, without thrills, bruits or peripheral edema   Abd:  Soft, non-tender, non-distended, normoactive bowel sounds    Musc:  No cyanosis, clubbing, atrophy or deformities.   Skin:  No rashes, bruising or ulcers.    Neuro: Awake and alert. Generally a  non focal exam.    Psych:  Has a good insight  and is oriented x 3        Current Facility-Administered Medications          Medication  Dose  Route  Frequency           ?  dexamethasone (PF) (DECADRON) 10 mg/mL injection 10 mg   10 mg  IntraVENous  Q12H     ?  midodrine (PROAMATINE) tablet 10 mg   10 mg  Oral  TID WITH MEALS     ?  benzonatate (TESSALON) capsule 100 mg   100 mg  Oral  TID PRN     ?  loperamide (IMODIUM) capsule 2 mg   2 mg  Oral  Q4H PRN     ?  ascorbic acid (vitamin C) (VITAMIN C) tablet 500 mg   500 mg  Oral  BID     ?  zinc sulfate (ZINCATE) 50 mg zinc (220 mg) capsule 1 Capsule   1 Capsule  Oral  DAILY     ?  cholecalciferol (VITAMIN D3) (1000 Units /25 mcg) tablet 1,000 Units   1,000 Units  Oral  DAILY     ?  melatonin (rapid dissolve) tablet 5 mg   5 mg  Oral  QHS     ?  sodium chloride (NS) flush 5-10 mL   5-10 mL  IntraVENous  PRN     ?  sodium chloride (NS) flush 5-40 mL   5-40 mL  IntraVENous  Q8H     ?  sodium chloride (NS) flush 5-40 mL   5-40 mL  IntraVENous  PRN     ?  acetaminophen (TYLENOL) tablet 650 mg   650 mg  Oral  Q6H PRN          Or           ?  acetaminophen (TYLENOL) suppository 650 mg   650 mg  Rectal  Q6H PRN     ?  polyethylene glycol (MIRALAX) packet 17 g   17 g  Oral  DAILY PRN           ?  enoxaparin (LOVENOX) injection 40 mg   40 mg  SubCUTAneous  Q12H           ?  baricitinib (OLUMIANT) tablet 4 mg   4 mg  Oral  DAILY            Laboratory data and review:        Recent Labs            09/13/20   0421  09/12/20   0520     WBC  13.7*  9.7     HGB  11.8  12.3     HCT  36.1  37.4         PLT  502*  483*          Recent Labs             09/13/20   0421  09/12/20   0520  09/11/20   0030     NA  141  142   --      K  4.2  4.8   --      CL  108  110*   --      CO2  28  26   --      GLU  176*  161*   --      BUN  8  14   --  CREA  0.48*  0.58  0.53*     CA  8.4*  7.7*   --      ALB  2.7*  2.6*   --           ALT  85*  94*   --         [facility] components found for: [facility]       Diagnostics: Imaging studies have been reviewed      Assessment and Plan:      Hypotension: Resolved. Possibly automatic dysfunction due to Covid.  Status post normal saline bolus and albumin.  D-dimer trending down.  Procalcitonin wnl.   UA unremarkable.  12/04 chest x-ray stable.   Continue midodrine.  Monitor.      Leukocytosis: Suspect due to steroids given procalcitonin wnl, UA unremarkable and 12/04 chest x-ray stable. However, given increase neutrophils and recent transient hypotension will check ct chest wo to r/o superimposed bacterial infection. Recheck CBC  tomorrow 12/06 and if WBC stable or improved anticipate DC and f/u OP.       Acute respiratory failure with hypoxia (HCC) (09/03/2020) POA: due to Co-V-19 viral infection.  Currently on 3L supplemental oxygen; wean as tolerated.  O2 ambulatory eval prior to discharge; anticipate patient needing O2 on discharge.      Pneumonia due to COVID-19 virus (09/03/2020) / Sepsis POA: she is unvaccinated and now with recovering severe disease. CTA neg for PE but multifocal disease.  Monitor inflammatory markers. Continue Dexamethasone - wean down today, Baricitinib and enoxaparin.  Incetive spirometry. Continue to follow      Bradycardia, sinus POA: improving. she has no symptoms. 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 Echo showed a normal LV function. TSH was normal. Seen by Cardiology. Monitor        Elevated liver enzymes (09/03/2020) POA: likely from Co-V 19. Trending down. Monitor         Morbid obesity:??Body mass index is 58.53 kg/m??.??RF for severe COVID.??Would benefit from weight loss and dietary / lifestyle modifications   ??   Total time spent for the patient's care: 31  Minutes                    Care Plan discussed with: Patient, Care Manager and Nursing Staff      Discussed:  Care Plan and D/C Planning      Prophylaxis:  Lovenox      Anticipated Disposition:  Home w/Family              ___________________________________________________      Attending Physician:   Horald ChestnutNora Sherlynn Tourville, DO

## 2020-09-14 ENCOUNTER — Inpatient Hospital Stay: Admit: 2020-09-14 | Payer: BLUE CROSS/BLUE SHIELD | Primary: Family Medicine

## 2020-09-14 LAB — COMPREHENSIVE METABOLIC PANEL
ALT: 80 U/L — ABNORMAL HIGH (ref 12–78)
AST: 17 U/L (ref 15–37)
Albumin/Globulin Ratio: 0.9 — ABNORMAL LOW (ref 1.1–2.2)
Albumin: 2.6 g/dL — ABNORMAL LOW (ref 3.5–5.0)
Alkaline Phosphatase: 51 U/L (ref 45–117)
Anion Gap: 5 mmol/L (ref 5–15)
BUN: 15 MG/DL (ref 6–20)
Bun/Cre Ratio: 29 — ABNORMAL HIGH (ref 12–20)
CO2: 28 mmol/L (ref 21–32)
Calcium: 8.4 MG/DL — ABNORMAL LOW (ref 8.5–10.1)
Chloride: 109 mmol/L — ABNORMAL HIGH (ref 97–108)
Creatinine: 0.51 MG/DL — ABNORMAL LOW (ref 0.55–1.02)
EGFR IF NonAfrican American: >60 mL/min/{1.73_m2} (ref >60)
GFR African American: >60 mL/min/{1.73_m2} (ref >60)
Globulin: 2.9 g/dL (ref 2.0–4.0)
Glucose: 131 mg/dL — ABNORMAL HIGH (ref 65–100)
Potassium: 4.5 mmol/L (ref 3.5–5.1)
Sodium: 142 mmol/L (ref 136–145)
Total Bilirubin: 0.2 MG/DL (ref 0.2–1.0)
Total Protein: 5.5 g/dL — ABNORMAL LOW (ref 6.4–8.2)

## 2020-09-14 LAB — CBC WITH AUTO DIFFERENTIAL
Band Neutrophils: 2 % (ref 0–6)
Basophils %: 0 % (ref 0–1)
Basophils Absolute: 0 10*3/uL (ref 0.0–0.1)
Eosinophils %: 0 % (ref 0–7)
Eosinophils Absolute: 0 10*3/uL (ref 0.0–0.4)
Granulocyte Absolute Count: 0 10*3/uL
Hematocrit: 36.6 % (ref 35.0–47.0)
Hemoglobin: 11.7 g/dL (ref 11.5–16.0)
Immature Granulocytes: 0 %
Lymphocytes %: 11 % — ABNORMAL LOW (ref 12–49)
Lymphocytes Absolute: 1.7 10*3/uL (ref 0.8–3.5)
MCH: 30.1 PG (ref 26.0–34.0)
MCHC: 32 g/dL (ref 30.0–36.5)
MCV: 94.1 FL (ref 80.0–99.0)
MPV: 9.8 FL (ref 8.9–12.9)
Metamyelocytes Relative: 1 % — ABNORMAL HIGH
Monocytes %: 5 % (ref 5–13)
Monocytes Absolute: 0.8 10*3/uL (ref 0.0–1.0)
Myelocyte Percent: 1 % — ABNORMAL HIGH
NRBC Absolute: 0 10*3/uL (ref 0.00–0.01)
Neutrophils %: 80 % — ABNORMAL HIGH (ref 32–75)
Neutrophils Absolute: 12.6 10*3/uL — ABNORMAL HIGH (ref 1.8–8.0)
Nucleated RBCs: 0 PER 100 WBC
Platelets: 534 10*3/uL — ABNORMAL HIGH (ref 150–400)
RBC: 3.89 M/uL (ref 3.80–5.20)
RDW: 13 % (ref 11.5–14.5)
WBC: 15.4 10*3/uL — ABNORMAL HIGH (ref 3.6–11.0)

## 2020-09-14 LAB — CBC WITH AUTOMATED DIFF
ABS. BASOPHILS: 0 10*3/uL (ref 0.0–0.1)
ABS. EOSINOPHILS: 0 10*3/uL (ref 0.0–0.4)
ABS. IMM. GRANS.: 0 10*3/uL
ABS. LYMPHOCYTES: 1.7 10*3/uL (ref 0.8–3.5)
ABS. MONOCYTES: 0.8 10*3/uL (ref 0.0–1.0)
ABS. NEUTROPHILS: 12.6 10*3/uL — ABNORMAL HIGH (ref 1.8–8.0)
ABSOLUTE NRBC: 0 10*3/uL (ref 0.00–0.01)
BAND NEUTROPHILS: 2 % (ref 0–6)
BASOPHILS: 0 % (ref 0–1)
EOSINOPHILS: 0 % (ref 0–7)
HCT: 36.6 % (ref 35.0–47.0)
HGB: 11.7 g/dL (ref 11.5–16.0)
IMMATURE GRANULOCYTES: 0 %
LYMPHOCYTES: 11 % — ABNORMAL LOW (ref 12–49)
MCH: 30.1 PG (ref 26.0–34.0)
MCHC: 32 g/dL (ref 30.0–36.5)
MCV: 94.1 FL (ref 80.0–99.0)
METAMYELOCYTES: 1 % — ABNORMAL HIGH
MONOCYTES: 5 % (ref 5–13)
MPV: 9.8 FL (ref 8.9–12.9)
MYELOCYTES: 1 % — ABNORMAL HIGH
NEUTROPHILS: 80 % — ABNORMAL HIGH (ref 32–75)
NRBC: 0 PER 100 WBC
PLATELET: 534 10*3/uL — ABNORMAL HIGH (ref 150–400)
RBC: 3.89 M/uL (ref 3.80–5.20)
RDW: 13 % (ref 11.5–14.5)
WBC: 15.4 10*3/uL — ABNORMAL HIGH (ref 3.6–11.0)

## 2020-09-14 LAB — METABOLIC PANEL, COMPREHENSIVE
A-G Ratio: 0.9 — ABNORMAL LOW (ref 1.1–2.2)
ALT (SGPT): 80 U/L — ABNORMAL HIGH (ref 12–78)
AST (SGOT): 17 U/L (ref 15–37)
Albumin: 2.6 g/dL — ABNORMAL LOW (ref 3.5–5.0)
Alk. phosphatase: 51 U/L (ref 45–117)
Anion gap: 5 mmol/L (ref 5–15)
BUN/Creatinine ratio: 29 — ABNORMAL HIGH (ref 12–20)
BUN: 15 MG/DL (ref 6–20)
Bilirubin, total: 0.2 MG/DL (ref 0.2–1.0)
CO2: 28 mmol/L (ref 21–32)
Calcium: 8.4 MG/DL — ABNORMAL LOW (ref 8.5–10.1)
Chloride: 109 mmol/L — ABNORMAL HIGH (ref 97–108)
Creatinine: 0.51 MG/DL — ABNORMAL LOW (ref 0.55–1.02)
GFR est AA: >60 mL/min/{1.73_m2} (ref >60)
GFR est non-AA: >60 mL/min/{1.73_m2} (ref >60)
Globulin: 2.9 g/dL (ref 2.0–4.0)
Glucose: 131 mg/dL — ABNORMAL HIGH (ref 65–100)
Potassium: 4.5 mmol/L (ref 3.5–5.1)
Protein, total: 5.5 g/dL — ABNORMAL LOW (ref 6.4–8.2)
Sodium: 142 mmol/L (ref 136–145)

## 2020-09-14 MED ORDER — FLUDROCORTISONE 0.1 MG TAB
0.1 mg | Freq: Two times a day (BID) | ORAL | Status: DC
Start: 2020-09-14 — End: 2020-09-16
  Administered 2020-09-14 – 2020-09-16 (×4): via ORAL

## 2020-09-14 MED ORDER — SODIUM CHLORIDE 0.9 % IV
INTRAVENOUS | Status: DC
Start: 2020-09-14 — End: 2020-09-15
  Administered 2020-09-14 – 2020-09-15 (×2): via INTRAVENOUS

## 2020-09-14 MED ORDER — SODIUM CHLORIDE 0.9% BOLUS IV
0.9 % | Freq: Once | INTRAVENOUS | Status: AC
Start: 2020-09-14 — End: 2020-09-14
  Administered 2020-09-14: 18:00:00 via INTRAVENOUS

## 2020-09-14 MED FILL — ASCORBIC ACID 500 MG TAB: 500 mg | ORAL | Qty: 1

## 2020-09-14 MED FILL — CHOLECALCIFEROL (VITAMIN D3) 1,000 UNIT (25 MCG) TAB: ORAL | Qty: 1

## 2020-09-14 MED FILL — OLUMIANT 2 MG TABLET: 2 mg | ORAL | Qty: 2

## 2020-09-14 MED FILL — MIDODRINE 5 MG TAB: 5 mg | ORAL | Qty: 2

## 2020-09-14 MED FILL — SODIUM CHLORIDE 0.9 % IV: INTRAVENOUS | Qty: 500

## 2020-09-14 MED FILL — ENOXAPARIN 40 MG/0.4 ML SUB-Q SYRINGE: 40 mg/0.4 mL | SUBCUTANEOUS | Qty: 0.4

## 2020-09-14 MED FILL — FLUDROCORTISONE 0.1 MG TAB: 0.1 mg | ORAL | Qty: 1

## 2020-09-14 MED FILL — SODIUM CHLORIDE 0.9 % IV: INTRAVENOUS | Qty: 250

## 2020-09-14 MED FILL — ZINC SULFATE 220 MG (50 MG ELEMENTAL ZINC) CAP: 50 mg zinc (220 mg) | ORAL | Qty: 1

## 2020-09-14 MED FILL — MELATONIN 5 MG TAB, RAPID DISSOLVE: 5 mg | ORAL | Qty: 1

## 2020-09-14 MED FILL — DEXAMETHASONE SODIUM PHOSPHATE (PF) 10 MG/ML INJECTION: 10 mg/mL | INTRAMUSCULAR | Qty: 1

## 2020-09-14 NOTE — Progress Notes (Signed)
1900 - Bedside and verbal shift change report given to Makayla, RN (oncoming nurse) by Makenzie, RN (offgoing nurse). Report included the following information: SBAR, Kardex, Intake/Output, MAR, Recent Results, and Med Rec Status.     This patient was assisted with intentional toileting every 2 hours during this shift as appropriate. Documentation of ambulation and output reflected on flow sheet as appropriate. Purposeful hourly rounding was completed using AIDET and 5 Ps. Outcomes of PHR documented as they occurred. Bed alarm in use as appropriate. Dual suction and ambubag in place.    0700 - Bedside and verbal shift change report given to Makenzie, RN (oncoming nurse) by Makayla, RN (offgoing nurse). Report included the following information: SBAR, Kardex, Intake/Output, MAR, Recent Results, and Med Rec Status.

## 2020-09-14 NOTE — Progress Notes (Signed)
Occupational therapy note:  Chart reviewed. Spoke with RN who reports patent currently hypotensive and requests defer at this time.  Will follow up for continued OT services.  Jocelyn C. Skipper, MS OTR/L

## 2020-09-14 NOTE — Progress Notes (Signed)
Progress  Notes by Horald Chestnut, DO at 09/14/20 1211                Author: Horald Chestnut, DO  Service: Internal Medicine  Author Type: Physician       Filed: 09/14/20 2232  Date of Service: 09/14/20 1211  Status: Addendum          Editor: Horald Chestnut, DO (Physician)          Related Notes: Original Note by Horald Chestnut, DO (Physician) filed at 09/14/20 1219                                                                                   [facility]   [address] [place], [place], [place] [address]   [phone]         Medical Progress Note         NAME:         Teresa Allen    DOB:        [birth year]   MRM:        [id number]      Date of service: 09/14/2020        Subjective: Patient has been seen and examined as a follow up for multiple medical issues. Chart, labs, diagnostics reviewed. Hypotensive again  today. She denies any chest pain, sob or abd pain. No dysuria. Encouraged patient to use incentive spirometry.      Objective:      Vital Signs:      Visit Vitals      BP  (!) 79/59        Pulse  (!) 58     Temp  98.2 ??F (36.8 ??C)     Resp  20     Ht  5\' 2"  (1.575 m)     Wt  (!) 160.3 kg (353 lb 6.4 oz)     SpO2  93%        BMI  64.64 kg/m??              Intake/Output Summary (Last 24 hours) at 09/14/2020 1211   Last data filed at 09/14/2020 0303     Gross per 24 hour        Intake  820 ml        Output  0 ml        Net  820 ml            Physical Examination:      General:  not in any acute distress     Eyes:   pink conjunctivae, PERRLA with no discharge.   ENT:   no ottorrhea or rhinorrhea with dry mucous membranes   Pulm: decreased breath sounds b/l. No wheezes   Card:  has regular and bradycardia, without thrills, bruits or peripheral edema   Abd:  Soft, non-tender, non-distended, normoactive bowel sounds    Musc:  No cyanosis, clubbing, atrophy or deformities.   Skin:  No rashes, bruising or ulcers.    Neuro: Awake and alert. Generally a non focal exam.    Psych:  Has a good  insight and is oriented x 3  Current Facility-Administered Medications          Medication  Dose  Route  Frequency           ?  sodium chloride 0.9 % bolus infusion 500 mL   500 mL  IntraVENous  ONCE     ?  0.9% sodium chloride infusion   75 mL/hr  IntraVENous  CONTINUOUS     ?  dexamethasone (PF) (DECADRON) 10 mg/mL injection 10 mg   10 mg  IntraVENous  Q24H     ?  midodrine (PROAMATINE) tablet 10 mg   10 mg  Oral  TID WITH MEALS     ?  benzonatate (TESSALON) capsule 100 mg   100 mg  Oral  TID PRN     ?  loperamide (IMODIUM) capsule 2 mg   2 mg  Oral  Q4H PRN     ?  ascorbic acid (vitamin C) (VITAMIN C) tablet 500 mg   500 mg  Oral  BID     ?  zinc sulfate (ZINCATE) 50 mg zinc (220 mg) capsule 1 Capsule   1 Capsule  Oral  DAILY     ?  cholecalciferol (VITAMIN D3) (1000 Units /25 mcg) tablet 1,000 Units   1,000 Units  Oral  DAILY     ?  melatonin (rapid dissolve) tablet 5 mg   5 mg  Oral  QHS     ?  sodium chloride (NS) flush 5-10 mL   5-10 mL  IntraVENous  PRN     ?  sodium chloride (NS) flush 5-40 mL   5-40 mL  IntraVENous  Q8H     ?  sodium chloride (NS) flush 5-40 mL   5-40 mL  IntraVENous  PRN     ?  acetaminophen (TYLENOL) tablet 650 mg   650 mg  Oral  Q6H PRN          Or           ?  acetaminophen (TYLENOL) suppository 650 mg   650 mg  Rectal  Q6H PRN     ?  polyethylene glycol (MIRALAX) packet 17 g   17 g  Oral  DAILY PRN     ?  enoxaparin (LOVENOX) injection 40 mg   40 mg  SubCUTAneous  Q12H           ?  baricitinib (OLUMIANT) tablet 4 mg   4 mg  Oral  DAILY            Laboratory data and review:        Recent Labs             09/14/20   0056  09/13/20   0421  09/12/20   0520     WBC  15.4*  13.7*  9.7     HGB  11.7  11.8  12.3     HCT  36.6  36.1  37.4          PLT  534*  502*  483*          Recent Labs             09/14/20   0056  09/13/20   0421  09/12/20   0520     NA  142  141  142     K  4.5  4.2  4.8     CL  109*  108  110*     CO2  28  28  26     GLU  131*  176*  161*     BUN  15  8  14       CREA  0.51*  0.48*  0.58     CA  8.4*  8.4*  7.7*     ALB  2.6*  2.7*  2.6*          ALT  80*  85*  94*        No components found for: [facility]      Diagnostics: Imaging studies have been reviewed      Assessment and Plan:      Hypotension: Again hypotensive today. Possibly automatic dysfunction due to Covid. GIve normal saline bolus and start maintainence IVF.  Procalcitonin wnl.   UA unremarkable.  12/04 chest x-ray stable.   Continue midodrine; on 10mg  TID, will not further titrate up due to bradycardia at this time. Start florinef. Start compression stockings. Re-consult cardiology.       Leukocytosis: Suspect due to steroids given procalcitonin wnl, UA unremarkable and 12/04 chest x-ray stable. However, given increase neutrophils and recent transient hypotension will check ct chest wo to r/o superimposed bacterial infection.       Acute respiratory failure with hypoxia (HCC) (09/03/2020) POA: due to Co-V-19 viral infection.  Now on RA.  O2 ambulatory eval prior to discharge when orthostatics improved.       Pneumonia due to COVID-19 virus (09/03/2020) / Sepsis POA: she is unvaccinated and now with recovering severe disease. CTA neg for PE but multifocal disease.  Monitor inflammatory markers. Continue Dexamethasone - wean down as able, Baricitinib and enoxaparin.  Incetive spirometry. Continue to follow      Bradycardia, sinus POA: improving.  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 Echo showed a normal LV function. TSH was normal. Seen by Cardiology. Monitor        Elevated liver enzymes (09/03/2020) POA: likely from Co-V 19. Trending down. Monitor         Morbid obesity:??Body mass index is 58.53 kg/m??.??RF for severe COVID.??Would benefit from weight loss and dietary / lifestyle modifications   ??   Total time spent for the patient's care: 31  Minutes                    Care Plan discussed with: Patient, Care Manager and Nursing Staff      Discussed:  Care Plan and D/C Planning       Prophylaxis:  Lovenox      Anticipated Disposition:  Home w/Family             ___________________________________________________      Attending Physician:   09/05/2020, DO

## 2020-09-14 NOTE — Progress Notes (Signed)
 Nutrition Assessment     Type and Reason for Visit: Reassess    Nutrition Recommendations/Plan:   Continue Regular Diet   Continue snacks/ONS to meet calorie needs     Nutrition Assessment:      12/6: Reassessment: Pt eating well currently. Diarrhea resolved- pt with BM 12/6 per flowsheet. No n/v. Pt on room air now per notes. Flowsheet not updated? Flowsheet indicates pt on 3 L NC. Noted BMI 64.64 with current wt from bed scale (3 lbs up since standing scale weight on 11/29.  Recommend outpt RD appointment if pt agreeable. NKFA. No chew/swallow difficulties.       23/74: 44 year old female admitted for COVID-19 [U07.1] who is morbidly obese and otherwise,  has no past medical history on file. RD called into patient's room. She reports decreased appetite. Had some diarrhea since getting COVID which was resolved with imodium. She has a menu to order meals & is not on any diet restrictions. Pt has not lost her sense of taste/smell.      Malnutrition Assessment:  Malnutrition Status: No malnutrition     Estimated Daily Nutrient Needs:  Energy (kcal):  2640 kcal/d (MSJ xAF 1.2)  Protein (g):  100 g (2 g/kg of IBW)       Fluid (ml/day):  (15 mL/kg of current wt)    Nutrition Related Findings:  Last BM: 12/2; ABD: intact, obese, semi-soft      Current Nutrition Therapies:  ADULT DIET Regular  ADULT ORAL NUTRITION SUPPLEMENT PM Snack, AM Snack; Snack; AM snack: 1 chocolate & 1 vanilla pudding  PM snack: cottage cheese & fruit    Documented Meal intake:  Patient Vitals for the past 168 hrs:   % Diet Eaten   09/13/20 1733 76 - 100%   09/13/20 1428 76 - 100%   09/13/20 1236 76 - 100%   09/13/20 1008 76 - 100%   09/12/20 1621 51 - 75%   09/12/20 1229 51 - 75%   09/12/20 0939 51 - 75%   09/11/20 0932 76 - 100%   09/10/20 1359 76 - 100%   09/10/20 1106 26 - 50%   09/09/20 1745 76 - 100%   09/09/20 1130 1 - 25%     Documentation of supplement intake:  Patient Vitals for the past 168 hrs:   Supplement intake %   09/12/20  1621 0%   09/12/20 1229 0%   09/12/20 0939 0%   09/11/20 0932 0%   09/10/20 1359 0%   09/10/20 1106 0%   09/09/20 1745 0%   09/09/20 1130 0%       Anthropometric Measures:   Height:  5' 2 (157.5 cm)   Current Body Wt:  158.8 kg (350 lb)   BMI: 64     Wt Readings from Last 6 Encounters:   09/14/20 (!) 160.3 kg (353 lb 6.4 oz)     Nutrition Diagnosis:    Inadequate protein-energy intake related to catabolic illness, early satiety, impaired respiratory function as evidenced by intake 26-50%, poor intake prior to admission, hospital food, decreased appetite, +COVID-19.      Nutrition Intervention:  Food and/or Nutrient Delivery: Continue current diet, Snacks (specify)  Nutrition Education and Counseling: Education initiated  Coordination of Nutrition Care: Continue to monitor while inpatient    Goals:  tolerance and acceptance of 75% of meals & snacks offered over the next 6-7 days       Nutrition Monitoring and Evaluation:   Behavioral-Environmental Outcomes: Knowledge  or skill  Food/Nutrient Intake Outcomes: Food and nutrient intake  Physical Signs/Symptoms Outcomes: GI status, Weight, Meal time behavior    Discharge Planning:    No discharge needs at this time     Electronically signed by Darryle DELENA Sharps, RD, on 09/14/2020   Contact Number: office [phone] or via [employer]

## 2020-09-14 NOTE — Progress Notes (Signed)
Transition of Care Plan:RUR-6%; LOS 11 days;covid+  1.Pt admitted with acute respiratory failure with hypoxia 2/2 COVID 19; pulmonary following  2.Pt now on RA and hypotensive  3.PT eval recommends HH PT and OT eval indicates discharge needs TBD. Lack of insurance is a barrier to [facility] PT.  Referrals sent in [facility] to [facility] and [facility] for charity visits  4. Will need charity visits for any therapy; [employer] for any O2 needs for homeO2  5.A list of [employer] providers was given to the pt to choose PCP  6. Pt was given Care Card application  7.Outpatient follow up.  8.Transportation:Family  L.Dorothyann Gibbs, RN

## 2020-09-14 NOTE — Progress Notes (Signed)
Chart access for potential Pt for this RN. Schedule change.

## 2020-09-14 NOTE — Progress Notes (Signed)
Chart reviewed.   Recurrent hypotension.   Continue IVF replacement.   Continue Midodrine.   Starting Florninef.   Contiue Dexa.   Will follow along.     Jennae Hakeem K. Dagoberto Reef, MD, [facility]

## 2020-09-14 NOTE — Progress Notes (Signed)
Bedside and Verbal shift change report given to Selena Batten RN (oncoming nurse) by Fonda Kinder RN (offgoing nurse). Report included the following information SBAR, Kardex, ED Summary, Intake/Output, MAR, Recent Results and Cardiac Rhythm NSR/SB.

## 2020-09-14 NOTE — Progress Notes (Signed)
1527 Bedside and Verbal shift change report given to [facility] RN (oncoming nurse) by Randolph Bing (offgoing nurse). Report included the following information SBAR, Kardex, ED Summary, Intake/Output, MAR, Recent Results and Cardiac Rhythm SB/NSR.     This patient was assisted with Intentional Toileting every 2 hours during this shift as appropriate.  Documentation of ambulation and output reflected on Flowsheet as appropriate.  Purposeful hourly rounding was completed using AIDET and 5Ps.  Outcomes of PHR documented as they occurred. Bed alarm in use as appropriate.  Dual Suction and ambubag in place.    1930 Bedside and Verbal shift change report given to [facility] RN (oncoming nurse) by Serita Kyle RN (offgoing nurse). Report included the following information SBAR, Kardex, ED Summary, Intake/Output, MAR, Recent Results and Cardiac Rhythm SB/NSR.

## 2020-09-14 NOTE — Progress Notes (Signed)
 Physical Therapy orders acknowledged, chart reviewed and discussed with nurse patient hypotensive advised to defer today. Asked nurse patient if she is in room air and patient has been in room air since Saturday, has been saturating above 90% Room Air. We will continue to follow up with the patient for therapy thank you.   Vitals:    09/14/20 0701 09/14/20 0816 09/14/20 1131 09/14/20 1145   BP:  (!) 112/54 (!) 81/40 (!) 72/53   BP 1 Location:  Left upper arm     BP Patient Position:  At rest     Pulse: (!) 48 (!) 53 (!) 53 69   Temp:  98.2 F (36.8 C) 98.2 F (36.8 C)    Resp:  15 18 18    Height:       Weight:       SpO2:  96% 91% 93%

## 2020-09-14 NOTE — Consults (Signed)
See note by Dr. Dagoberto Reef 09/14/20.

## 2020-09-14 NOTE — Progress Notes (Signed)
This patient was assisted with Intentional Toileting every 2 hours during this shift as appropriate.  Documentation of ambulation and output reflected on Flowsheet as appropriate.  Purposeful hourly rounding was completed using AIDET and 5Ps.  Outcomes of PHR documented as they occurred. Bed alarm in use as appropriate.  Dual Suction and ambubag in place.

## 2020-09-15 LAB — COMPREHENSIVE METABOLIC PANEL
ALT: 78 U/L (ref 12–78)
AST: 13 U/L — ABNORMAL LOW (ref 15–37)
Albumin/Globulin Ratio: 0.9 — ABNORMAL LOW (ref 1.1–2.2)
Albumin: 2.7 g/dL — ABNORMAL LOW (ref 3.5–5.0)
Alkaline Phosphatase: 59 U/L (ref 45–117)
Anion Gap: 8 mmol/L (ref 5–15)
BUN: 18 MG/DL (ref 6–20)
Bun/Cre Ratio: 22 — ABNORMAL HIGH (ref 12–20)
CO2: 27 mmol/L (ref 21–32)
Calcium: 8.7 MG/DL (ref 8.5–10.1)
Chloride: 108 mmol/L (ref 97–108)
Creatinine: 0.81 MG/DL (ref 0.55–1.02)
EGFR IF NonAfrican American: >60 mL/min/{1.73_m2} (ref >60)
GFR African American: >60 mL/min/{1.73_m2} (ref >60)
Globulin: 2.9 g/dL (ref 2.0–4.0)
Glucose: 141 mg/dL — ABNORMAL HIGH (ref 65–100)
Potassium: 4.4 mmol/L (ref 3.5–5.1)
Sodium: 143 mmol/L (ref 136–145)
Total Bilirubin: 0.2 MG/DL (ref 0.2–1.0)
Total Protein: 5.6 g/dL — ABNORMAL LOW (ref 6.4–8.2)

## 2020-09-15 LAB — CBC WITH AUTO DIFFERENTIAL
Band Neutrophils: 1 % (ref 0–6)
Basophils %: 0 % (ref 0–1)
Basophils Absolute: 0 10*3/uL (ref 0.0–0.1)
Eosinophils %: 0 % (ref 0–7)
Eosinophils Absolute: 0 10*3/uL (ref 0.0–0.4)
Granulocyte Absolute Count: 0 10*3/uL
Hematocrit: 38.4 % (ref 35.0–47.0)
Hemoglobin: 12.5 g/dL (ref 11.5–16.0)
Immature Granulocytes: 0 %
Lymphocytes %: 6 % — ABNORMAL LOW (ref 12–49)
Lymphocytes Absolute: 0.8 10*3/uL (ref 0.8–3.5)
MCH: 31 PG (ref 26.0–34.0)
MCHC: 32.6 g/dL (ref 30.0–36.5)
MCV: 95.3 FL (ref 80.0–99.0)
MPV: 9.7 FL (ref 8.9–12.9)
Monocytes %: 7 % (ref 5–13)
Monocytes Absolute: 0.9 10*3/uL (ref 0.0–1.0)
NRBC Absolute: 0 10*3/uL (ref 0.00–0.01)
Neutrophils %: 86 % — ABNORMAL HIGH (ref 32–75)
Neutrophils Absolute: 10.9 10*3/uL — ABNORMAL HIGH (ref 1.8–8.0)
Nucleated RBCs: 0 PER 100 WBC
Platelets: 547 10*3/uL — ABNORMAL HIGH (ref 150–400)
RBC: 4.03 M/uL (ref 3.80–5.20)
RDW: 12.9 % (ref 11.5–14.5)
WBC: 12.6 10*3/uL — ABNORMAL HIGH (ref 3.6–11.0)

## 2020-09-15 LAB — CBC WITH AUTOMATED DIFF
ABS. BASOPHILS: 0 10*3/uL (ref 0.0–0.1)
ABS. EOSINOPHILS: 0 10*3/uL (ref 0.0–0.4)
ABS. IMM. GRANS.: 0 10*3/uL
ABS. LYMPHOCYTES: 0.8 10*3/uL (ref 0.8–3.5)
ABS. MONOCYTES: 0.9 10*3/uL (ref 0.0–1.0)
ABS. NEUTROPHILS: 10.9 10*3/uL — ABNORMAL HIGH (ref 1.8–8.0)
ABSOLUTE NRBC: 0 10*3/uL (ref 0.00–0.01)
BAND NEUTROPHILS: 1 % (ref 0–6)
BASOPHILS: 0 % (ref 0–1)
EOSINOPHILS: 0 % (ref 0–7)
HCT: 38.4 % (ref 35.0–47.0)
HGB: 12.5 g/dL (ref 11.5–16.0)
IMMATURE GRANULOCYTES: 0 %
LYMPHOCYTES: 6 % — ABNORMAL LOW (ref 12–49)
MCH: 31 PG (ref 26.0–34.0)
MCHC: 32.6 g/dL (ref 30.0–36.5)
MCV: 95.3 FL (ref 80.0–99.0)
MONOCYTES: 7 % (ref 5–13)
MPV: 9.7 FL (ref 8.9–12.9)
NEUTROPHILS: 86 % — ABNORMAL HIGH (ref 32–75)
NRBC: 0 PER 100 WBC
PLATELET: 547 10*3/uL — ABNORMAL HIGH (ref 150–400)
RBC: 4.03 M/uL (ref 3.80–5.20)
RDW: 12.9 % (ref 11.5–14.5)
WBC: 12.6 10*3/uL — ABNORMAL HIGH (ref 3.6–11.0)

## 2020-09-15 LAB — METABOLIC PANEL, COMPREHENSIVE
A-G Ratio: 0.9 — ABNORMAL LOW (ref 1.1–2.2)
ALT (SGPT): 78 U/L (ref 12–78)
AST (SGOT): 13 U/L — ABNORMAL LOW (ref 15–37)
Albumin: 2.7 g/dL — ABNORMAL LOW (ref 3.5–5.0)
Alk. phosphatase: 59 U/L (ref 45–117)
Anion gap: 8 mmol/L (ref 5–15)
BUN/Creatinine ratio: 22 — ABNORMAL HIGH (ref 12–20)
BUN: 18 MG/DL (ref 6–20)
Bilirubin, total: 0.2 MG/DL (ref 0.2–1.0)
CO2: 27 mmol/L (ref 21–32)
Calcium: 8.7 MG/DL (ref 8.5–10.1)
Chloride: 108 mmol/L (ref 97–108)
Creatinine: 0.81 MG/DL (ref 0.55–1.02)
GFR est AA: >60 mL/min/{1.73_m2} (ref >60)
GFR est non-AA: >60 mL/min/{1.73_m2} (ref >60)
Globulin: 2.9 g/dL (ref 2.0–4.0)
Glucose: 141 mg/dL — ABNORMAL HIGH (ref 65–100)
Potassium: 4.4 mmol/L (ref 3.5–5.1)
Protein, total: 5.6 g/dL — ABNORMAL LOW (ref 6.4–8.2)
Sodium: 143 mmol/L (ref 136–145)

## 2020-09-15 MED FILL — MIDODRINE 5 MG TAB: 5 mg | ORAL | Qty: 2

## 2020-09-15 MED FILL — ASCORBIC ACID 500 MG TAB: 500 mg | ORAL | Qty: 1

## 2020-09-15 MED FILL — FLUDROCORTISONE 0.1 MG TAB: 0.1 mg | ORAL | Qty: 1

## 2020-09-15 MED FILL — DEXAMETHASONE SODIUM PHOSPHATE (PF) 10 MG/ML INJECTION: 10 mg/mL | INTRAMUSCULAR | Qty: 1

## 2020-09-15 MED FILL — ZINC SULFATE 220 MG (50 MG ELEMENTAL ZINC) CAP: 50 mg zinc (220 mg) | ORAL | Qty: 1

## 2020-09-15 MED FILL — OLUMIANT 2 MG TABLET: 2 mg | ORAL | Qty: 2

## 2020-09-15 MED FILL — CHOLECALCIFEROL (VITAMIN D3) 1,000 UNIT (25 MCG) TAB: ORAL | Qty: 1

## 2020-09-15 MED FILL — MELATONIN 5 MG TAB, RAPID DISSOLVE: 5 mg | ORAL | Qty: 1

## 2020-09-15 MED FILL — ENOXAPARIN 40 MG/0.4 ML SUB-Q SYRINGE: 40 mg/0.4 mL | SUBCUTANEOUS | Qty: 0.4

## 2020-09-15 NOTE — Progress Notes (Signed)
1900 - Bedside and verbal shift change report given to [facility], RN (Cabin crew) by Serita Kyle, RN (offgoing nurse). Report included the following information: SBAR, Kardex, Intake/Output, MAR, Recent Results, and Med Rec Status.     This patient was assisted with intentional toileting every 2 hours during this shift as appropriate. Documentation of ambulation and output reflected on flow sheet as appropriate. Purposeful hourly rounding was completed using AIDET and 5 Ps. Outcomes of PHR documented as they occurred. Bed alarm in use as appropriate. Dual suction and ambubag in place.    0700 - Bedside and verbal shift change report given to Lelon Mast, Charity fundraiser (Cabin crew) by Fonda Kinder, RN (offgoing nurse). Report included the following information: SBAR, Kardex, Intake/Output, MAR, Recent Results, and Med Rec Status.

## 2020-09-15 NOTE — Progress Notes (Signed)
0700 Bedside and Verbal shift change report given to [facility] RN (oncoming nurse) by Fonda Kinder RN (offgoing nurse). Report included the following information SBAR, Kardex, ED Summary, Intake/Output, MAR, Recent Results and Cardiac Rhythm SB/NSR.     This patient was assisted with Intentional Toileting every 2 hours during this shift as appropriate.  Documentation of ambulation and output reflected on Flowsheet as appropriate.  Purposeful hourly rounding was completed using AIDET and 5Ps.  Outcomes of PHR documented as they occurred. Bed alarm in use as appropriate.  Dual Suction and ambubag in place.    1040 Pt Unable to tolerate Compression stockings will reach out to PT for sizing issues.    1930 Bedside and Verbal shift change report given to [facility] RN (oncoming nurse) by Serita Kyle RN (offgoing nurse). Report included the following information SBAR, Kardex, ED Summary, Intake/Output, MAR, Recent Results and Cardiac Rhythm SB/NSR.

## 2020-09-15 NOTE — Progress Notes (Signed)
1615:  Pt is set up with a PCP.  Still working on getting home health.    1526:  Pt stated she can be set up with home health through her work.  She will let me know what agency they set her up with.  Working on getting her scheduled with a PCP.    1453:  [facility], [facility], and [facility] declined request for charity visits.  Additional referrals were sent to [place], [facility], [facility] and [facility], which have also declined.    CM Note:  Called pt's cell phone to ask if she had chosen a PCP.  She stated the list was lost in transit.  Will give her another list of proivders.  L.Neely, RN

## 2020-09-15 NOTE — Progress Notes (Signed)
 Problem: Self Care Deficits Care Plan (Adult)  Goal: *Therapy Goal (Edit Goal, Insert Text)  Description: FUNCTIONAL STATUS PRIOR TO ADMISSION: Patient was independent and active without use of DME. NO supplemental O2 use    HOME SUPPORT: The patient lived with her children.    Occupational Therapy  Initiated 09/07/2020; All goals reviewed and remain appropriate at weekly re-assessment, 09/15/2020:  Target for all goals: SPO2 >88% on </= 6L/min via nasal cannula  1.  Patient will perform grooming in stand with modified independence within 7 day(s).  2.  Patient will perform bathing with modified independence within 7 day(s).  3.  Patient will perform lower body dressing with modified independence within 7 day(s).  4.  Patient will perform toilet transfers with modified independence within 7 day(s).  5.  Patient will perform all aspects of toileting with modified independence within 7 day(s).  6.  Patient will participate in upper extremity therapeutic exercise/activities with modified independence for 10 minutes within 7 day(s).    7.  Patient will utilize energy conservation techniques during functional activities with verbal cues within 7 day(s).        Outcome: Progressing Towards Goal   OCCUPATIONAL THERAPY TREATMENT/WEEKLY RE-ASSESSMENT  Patient: Teresa Allen (44 y.o. female)  Date: 09/15/2020  Diagnosis: COVID-19 [U07.1]   <principal problem not specified>       Precautions:  (droplet +), fall  Chart, occupational therapy assessment, plan of care, and goals were reviewed.    ASSESSMENT  Patient continues with skilled OT services and is progressing towards goals.  Ms. Molock was seen today for weekly re-assessment.  She has made good progress since last assessment including increased activity tolerance, decreased overall assistance needed, now weaned to room air, and demonstrating carryover of education provided.  Patient performed transfers with SBA without use of AD.  She was able to transfer into the  bathroom for ADL retraining.  Patient currently requires up to [facility] for LB ADLs and toileting.  Patient was received and remained on room air with SpO2 ranging from 88-94% with all activity.       Current Level of Function Impacting Discharge (ADLs): Patient required supervision for bed mobility and SBA for OOB transfers.  Patient was independent to setup level for UB ADL and required supervision/setup to CGA for LB ADLs.             PLAN :  Goals have been updated based on progression since last assessment. Patient continues to benefit from skilled intervention to address the above impairments. Continue to follow patient 5 times a week to address goals.    Recommend with staff:   Recommend patient be OOB to chair as frequently as tolerated; Goal of 3x/day for all meals for 60 minutes at a time.   For toileting needs, recommend transfers to/from bathroom with staff assist and monitoring.  Encourage patient involvement in personal care as able.      Recommend next OT session: Continue towards set OT goals.      Recommendation for discharge: (in order for the patient to meet his/her long term goals)  Occupational therapy at least 2 days/week in the home     This discharge recommendation:  Has been made in collaboration with the attending provider and/or case management    IF patient discharges home will need the following DME: none       SUBJECTIVE:   Patient stated "It feels so good to go into the actual bathroom."    OBJECTIVE DATA  SUMMARY:   Cognitive/Behavioral Status:  Neurologic State: Alert  Orientation Level: Oriented X4  Cognition: Appropriate decision making; Appropriate for age attention/concentration; Appropriate safety awareness  Perception: Appears intact  Perseveration: No perseveration noted  Safety/Judgement: Awareness of environment    Functional Mobility and Transfers for ADLs:  Bed Mobility:  Supine to Sit: Supervision  Sit to Supine: Supervision  Scooting: Supervision    Transfers:  Sit to Stand:  Stand-by assistance  Functional Transfers  Toilet Transfer : Stand-by assistance  Bed to Chair: Stand-by assistance    Balance:  Sitting: Intact  Standing: Impaired  Standing - Static: Good  Standing - Dynamic : Fair    ADL Intervention:  Feeding  Feeding Assistance: Independent    Grooming  Grooming Assistance: Set-up  Position Performed: Seated in chair  Washing Face: Set-up  Washing Hands: Set-up    Upper Body Bathing  Bathing Assistance: Set-up    Lower Body Bathing  Bathing Assistance: Contact guard assistance    Upper Body Dressing Assistance  Dressing Assistance: Set-up    Lower Body Dressing Assistance  Dressing Assistance: Contact guard assistance    Toileting  Toileting Assistance: Set-up; Supervision  Bladder Hygiene: Set-up  Bowel Hygiene: Supervision  Clothing Management: Supervision  Cues: Verbal cues provided  Adaptive Equipment: Grab bars    Cognitive Retraining  Safety/Judgement: Awareness of environment      Activity Tolerance:   Good    After treatment patient left in no apparent distress:   Sitting in chair, Call bell within reach, and Bed / chair alarm activated    COMMUNICATION/COLLABORATION:   The patient's plan of care was discussed with: Physical therapist, Registered nurse, and patient .     Ronal Done, OTR/L  Time Calculation: 40 mins

## 2020-09-15 NOTE — Progress Notes (Signed)
Hospital Follow [facility] Tele Health Virtual Visit with PCP Oren Binet, MD for 09/28/20 at 1:00pm

## 2020-09-15 NOTE — Progress Notes (Signed)
 Problem: Mobility Impaired (Adult and Pediatric)  Goal: *Acute Goals and Plan of Care (Insert Text)  Description: FUNCTIONAL STATUS PRIOR TO ADMISSION: Patient was independent and active without use of DME.    HOME SUPPORT PRIOR TO ADMISSION: The patient lived with family but did not require assist.    Physical Therapy Goals  Initiated 09/07/2020; reviewed 09/15/2020 and remain appropriate  1.  Patient will move from supine to sit and sit to supine , scoot up and down, and roll side to side in bed with independence within 7 day(s).    2.  Patient will transfer from bed to chair and chair to bed with independence using the least restrictive device within 7 day(s).  3.  Patient will perform sit to stand with independence within 7 day(s).  4.  Patient will ambulate with independence for 200 feet with the least restrictive device within 7 day(s).   5.  Patient will ascend/descend 3 stairs with  handrail(s) with modified independence within 7 day(s).     Outcome: Progressing Towards Goal   PHYSICAL THERAPY TREATMENT: WEEKLY REASSESSMENT  Patient: Margart Zemanek (44 y.o. female)  Date: 09/15/2020  Primary Diagnosis: COVID-19 [U07.1]       Precautions:    (droplet +)      ASSESSMENT  Patient continues with skilled PT services and is progressing towards goals. Patient today making good progress towards goals, albeit slow. She is received on RA, spo2 93-98% at rest. She transfers to EOB with supervision and ambulates in room to bathroom and to bedside chair with SBA. Patient without complaints of lightheadedness/dizziness and vitals stable. Patient when ambulating frequently reaching out for wall, bed rail to assist with stability. PT recommending future training with SPC/quad cane to address which patient is agreeable to.  Upon return to bedside chair, patient spo2 88-90% on RA, blood pressure did drop, however not true orthostatic drop and patient asymptomatic. Home oxygen not indicated at this time.  Discussed with  patient importance of activity pacing, pursed lip breathing with patient demonstrating good understanding. Recommend HHPT upon d/c.     Vitals:    09/15/20 1300 09/15/20 1443 09/15/20 1458 09/15/20 1516   BP: 112/67 109/66 (!) 92/45 (!) 96/44   BP 1 Location: Left lower arm Left lower arm Left lower arm Left lower arm   BP Patient Position: Supine Sitting Sitting  Comment: post ambulation At rest; Sitting   Pulse: 60 75 73 61   Temp:    98.1 F (36.7 C)   Resp:    18   Height:       Weight:    148.5 kg (327 lb 6.1 oz)   SpO2: 94% 92% (!) 88% 92%         Patient's progression toward goals since last assessment: patient is making slow but steady progress towards goals    Current Level of Function Impacting Discharge (mobility/balance): SBA; ambulation limited by endurance    Functional Outcome Measure:  The patient scored 55/100 on the Barthel Index outcome measure which is indicative of partial dependence on others.      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 Communicated to CM following treatment.          PLAN :  Goals have been updated based on progression since last assessment.  Patient continues to benefit from skilled intervention to  address the above impairments.    Recommendations and Planned Interventions: bed mobility training, transfer training, gait training, therapeutic exercises, edema management/control, patient and family training/education and therapeutic activities      Frequency/Duration: Patient will be followed by physical therapy:  3 times a week to address goals.    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 I can get whatever I need."    OBJECTIVE DATA SUMMARY:   HISTORY:    No past medical history on file.  Past Surgical History:   Procedure Laterality Date   . HX LUMBAR FUSION         Personal factors and/or comorbidities impacting plan of care: none additional    Home Situation  Home Environment: Private residence  # Steps to Enter: 3  Rails to Enter: No  One/Two Story Residence: One story  Living Alone: No  Support Systems: Child(ren), Other Family Member(s)  Patient Expects to be Discharged to:: House  Current DME Used/Available at Home: None  Tub or Shower Type: Tub/Shower combination    EXAMINATION/PRESENTATION/DECISION MAKING:   Critical Behavior:  Neurologic State: Alert, Appropriate for age  Orientation Level: Oriented X4  Cognition: Appropriate decision making, Appropriate for age attention/concentration, Appropriate safety awareness, Follows commands  Safety/Judgement: Awareness of environment, Fall prevention  Hearing:  Auditory  Auditory Impairment: None  Skin:  All observed intact  Edema: none apparent   Range Of Motion:  AROM: Within functional limits                       Strength:    Strength: Within functional limits                    Tone & Sensation:   Tone: Normal              Sensation: Intact               Coordination:  Coordination: Within functional limits  Vision:      Functional Mobility:  Bed Mobility:     Supine to Sit: Supervision  Sit to Supine: Supervision  Scooting: Supervision  Transfers:  Sit to Stand: Stand-by assistance  Stand to Sit: Stand-by assistance        Bed to Chair: Stand-by assistance              Balance:   Sitting: Intact  Standing: Impaired; Without support  Standing - Static: Good  Standing - Dynamic : Fair  Ambulation/Gait Training:  Distance  (ft): 30 Feet (ft)     Ambulation - Level of Assistance: Stand-by assistance        Gait Abnormalities: Trunk sway increased        Base of Support: Widened                          Therapeutic Exercises:   Reviewed pursed lip breathing     Functional Measure:  Barthel Index:    Bathing: 0  Bladder: 10  Bowels: 10  Grooming: 5  Dressing: 5  Feeding: 10  Mobility: 0  Stairs: 0  Toilet Use: 5  Transfer (Bed to Chair and Back): 10  Total: 55/100       The Barthel ADL Index: Guidelines  1. The index should be used as a record of what a patient does, not as a record of what a patient could do.  2. The main aim is to establish degree of independence from any help, physical or verbal, however minor and for whatever reason.  3. The need for supervision renders the patient not independent.  4. A patient's performance should be established using the best available evidence. Asking the patient, friends/relatives and nurses are the usual sources, but direct observation and common sense are also important. However direct testing is not needed.  5. Usually the patient's performance over the preceding 24-48 hours is important, but occasionally longer periods will be relevant.  6. Middle categories imply that the patient supplies over 50 per cent of the effort.  7. Use of aids to be independent is allowed.    Score Interpretation (from Sinoff 1997)   80-100 Independent   60-79 Minimally independent   40-59 Partially dependent   20-39 Very dependent   <20 Totally dependent     -Mahoney, F.l., Barthel, D.W. (1965). Functional evaluation: the Barthel Index. [place] [address] [place] (14)2.  -Sinoff, G., Ore, L. (1997). The Barthel activities of daily living index: self-reporting versus actual performance in the old (> or = 75 years). Journal of American Geriatric Society 45(7), [phone].   -Fleeta cotton [place]ington, J.J.M.F, Orman ROES., Oneita MERYL Sebastian DELORSE. (1999). Measuring the change in disability after inpatient rehabilitation; comparison of  the responsiveness of the Barthel Index and Functional Independence Measure. Journal of Neurology, Neurosurgery, and Psychiatry, 66(4), [phone].  Marea Chillington, N.J.A, [place],  W.J.M, & Koopmanschap, M.A. (2004) Assessment of post-stroke quality of life in cost-effectiveness studies: The usefulness of the Barthel Index and the EuroQoL-5D. Quality of Life Research, 13, 427-43           Pain Rating:  Patient without reports of pain during therapy      Activity Tolerance:   Fair, SpO2 stable on RA and requires rest breaks    After treatment patient left in no apparent distress:   Sitting in chair, Heels elevated for pressure relief and Call bell within reach    COMMUNICATION/EDUCATION:   The patient's plan of care was discussed with: Occupational therapist and Registered nurse.     Fall prevention education was provided and the patient/caregiver indicated understanding. and Patient/family have participated as able in goal setting and plan of care.    Thank you for this referral.  Graig Pore PT, DPT   Time Calculation: 39 mins

## 2020-09-15 NOTE — Progress Notes (Signed)
Progress  Notes by Horald Chestnut, DO at 09/15/20 1414                Author: Horald Chestnut, DO  Service: Internal Medicine  Author Type: Physician       Filed: 09/15/20 1420  Date of Service: 09/15/20 1414  Status: Addendum          Editor: Horald Chestnut, DO (Physician)          Related Notes: Original Note by Horald Chestnut, DO (Physician) filed at 09/15/20 1420                                                                                   [facility]   [address] [place], [place], [place] [address]   [phone]         Medical Progress Note         NAME:         Teresa Allen    DOB:        03-09-1976   MRM:        [id number]      Date of service: 09/15/2020        Subjective: Patient has been seen and examined as a follow up for multiple medical issues. Chart, labs, diagnostics reviewed. Improved hypotension/  remains bradycardic. She denies any chest pain, sob or abd pain. Working on safe discharge; patient has no insurance which is providing some barriers.      Objective:      Vital Signs:      Visit Vitals      BP  (!) 104/57        Pulse  (!) 52     Temp  97.7 ??F (36.5 ??C)     Resp  18     Ht  5\' 2"  (1.575 m)     Wt  (!) 160.3 kg (353 lb 6.4 oz)     SpO2  91%        BMI  64.64 kg/m??              Intake/Output Summary (Last 24 hours) at 09/15/2020 1414   Last data filed at 09/15/2020 1005     Gross per 24 hour        Intake  780 ml        Output  4000 ml        Net  -3220 ml            Physical Examination:      General:  not in any acute distress     Eyes:   pink conjunctivae, PERRLA with no discharge.   ENT:   no ottorrhea or rhinorrhea with dry mucous membranes   Pulm: decreased breath sounds b/l. No wheezes   Card:  has regular and bradycardia, without thrills, bruits or peripheral edema   Abd:  Soft, non-tender, non-distended, normoactive bowel sounds    Musc:  No cyanosis, clubbing, atrophy or deformities.   Skin:  No rashes, bruising or ulcers.    Neuro: Awake and alert.  Generally a non focal exam.    Psych:  Has a good  insight and is oriented x 3        Current Facility-Administered Medications          Medication  Dose  Route  Frequency           ?  0.9% sodium chloride infusion   75 mL/hr  IntraVENous  CONTINUOUS     ?  fludrocortisone (FLORINEF) tablet 0.1 mg   0.1 mg  Oral  BID     ?  dexamethasone (PF) (DECADRON) 10 mg/mL injection 10 mg   10 mg  IntraVENous  Q24H     ?  midodrine (PROAMATINE) tablet 10 mg   10 mg  Oral  TID WITH MEALS     ?  benzonatate (TESSALON) capsule 100 mg   100 mg  Oral  TID PRN     ?  loperamide (IMODIUM) capsule 2 mg   2 mg  Oral  Q4H PRN     ?  ascorbic acid (vitamin C) (VITAMIN C) tablet 500 mg   500 mg  Oral  BID     ?  zinc sulfate (ZINCATE) 50 mg zinc (220 mg) capsule 1 Capsule   1 Capsule  Oral  DAILY     ?  cholecalciferol (VITAMIN D3) (1000 Units /25 mcg) tablet 1,000 Units   1,000 Units  Oral  DAILY     ?  melatonin (rapid dissolve) tablet 5 mg   5 mg  Oral  QHS     ?  sodium chloride (NS) flush 5-10 mL   5-10 mL  IntraVENous  PRN     ?  sodium chloride (NS) flush 5-40 mL   5-40 mL  IntraVENous  Q8H     ?  sodium chloride (NS) flush 5-40 mL   5-40 mL  IntraVENous  PRN     ?  acetaminophen (TYLENOL) tablet 650 mg   650 mg  Oral  Q6H PRN          Or           ?  acetaminophen (TYLENOL) suppository 650 mg   650 mg  Rectal  Q6H PRN     ?  polyethylene glycol (MIRALAX) packet 17 g   17 g  Oral  DAILY PRN     ?  enoxaparin (LOVENOX) injection 40 mg   40 mg  SubCUTAneous  Q12H           ?  baricitinib (OLUMIANT) tablet 4 mg   4 mg  Oral  DAILY            Laboratory data and review:        Recent Labs             09/15/20   0115  09/14/20   0056  09/13/20   0421     WBC  12.6*  15.4*  13.7*     HGB  12.5  11.7  11.8     HCT  38.4  36.6  36.1          PLT  547*  534*  502*          Recent Labs             09/15/20   0115  09/14/20   0056  09/13/20   0421     NA  143  142  141     K  4.4  4.5  4.2     CL  108  109*  108  CO2  27  28  28      GLU   141*  131*  176*     BUN  18  15  8      CREA  0.81  0.51*  0.48*     CA  8.7  8.4*  8.4*     ALB  2.7*  2.6*  2.7*          ALT  78  80*  85*        No components found for: [facility]      Diagnostics: Imaging studies have been reviewed      Assessment and Plan:      Hypotension: Improved. Possibly automatic dysfunction due to Covid. Stop IVF. Doubt infection; procalcitonin wnl, UA unremarkable and CT chest without any concern for new infiltrates. Continue midodrine  10mg  TID; per cardio may need to be titrated down if HR < 50 while awake. Continue florinef. Start compression stockings. Cardiology following.      Leukocytosis: Now improved. Suspect due to steroids given procalcitonin wnl, UA unremarkable and 12/06 CT chest with no concern for new infiltrate. Monitor.      Acute respiratory failure with hypoxia (HCC) (09/03/2020) POA: due to Co-V-19 viral infection.  Now on RA.  O2 ambulatory eval prior to discharge.       Pneumonia due to COVID-19 virus (09/03/2020) / Sepsis POA: she is unvaccinated and now with recovering severe disease. CTA neg for PE but multifocal disease; CT chest 12/06 fibrotic changes. Monitor inflammatory markers. Continue Dexamethasone - wean  down as able, Baricitinib and enoxaparin. Incetive spirometry.       Bradycardia, sinus POA: improving.  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 Echo showed a normal LV function. TSH was normal.  Cardiology following. Monitor        Elevated liver enzymes (09/03/2020) POA: likely from Co-V 19. Trending down. Monitor         Morbid obesity:??Body mass index is 58.53 kg/m??.??RF for severe COVID.??Would benefit from weight loss and dietary / lifestyle modifications   ??   Total time spent for the patient's care: 27  Minutes                    Care Plan discussed with: Patient, Care Manager and Nursing Staff      Discussed:  Care Plan and D/C Planning      Prophylaxis:  Lovenox      Anticipated Disposition:  Home with HH versus rehab               ___________________________________________________      Attending Physician:   14/06, DO

## 2020-09-15 NOTE — Progress Notes (Signed)
This patient was assisted with Intentional Toileting every 2 hours during this shift as appropriate.  Documentation of ambulation and output reflected on Flowsheet as appropriate.  Purposeful hourly rounding was completed using AIDET and 5Ps.  Outcomes of PHR documented as they occurred. Bed alarm in use as appropriate.  Dual Suction and ambubag in place.

## 2020-09-15 NOTE — Progress Notes (Signed)
Progress Notes by Kimber Relic, NP at 09/15/20 1337                Author: Kimber Relic, NP  Service: Nurse Practitioner  Author Type: Nurse Practitioner       Filed: 09/15/20 1341  Date of Service: 09/15/20 1337  Status: Attested           Editor: Kimber Relic, NP (Nurse Practitioner)  Cosigner: Victorino December, MD at 09/15/20 1553          Attestation signed by Victorino December, MD at 09/15/20 1553          Data reviewed.    Continue Florinef, Midodrine.    Will see as needed.    Vikas K. Dagoberto Reef, MD, [facility]                                    Chart reviewed.    On midodrine and florinef for hypotension.  Florinef started yesterday.     VS stable today - orthostatics negative.  Will cont meds.        Bradycardia on telemetry - HR 50s-60s, infreq 40s.  If HR running <50 awake, will need to back off on midodrine.        Will follow chart - see PRN.       Fritzi Mandes, ANP

## 2020-09-16 LAB — CBC WITH AUTO DIFFERENTIAL
Basophils %: 0 % (ref 0–1)
Basophils Absolute: 0 10*3/uL (ref 0.0–0.1)
Eosinophils %: 0 % (ref 0–7)
Eosinophils Absolute: 0 10*3/uL (ref 0.0–0.4)
Granulocyte Absolute Count: 0 10*3/uL
Hematocrit: 37 % (ref 35.0–47.0)
Hemoglobin: 11.9 g/dL (ref 11.5–16.0)
Immature Granulocytes: 0 %
Lymphocytes %: 8 % — ABNORMAL LOW (ref 12–49)
Lymphocytes Absolute: 1 10*3/uL (ref 0.8–3.5)
MCH: 30.3 PG (ref 26.0–34.0)
MCHC: 32.2 g/dL (ref 30.0–36.5)
MCV: 94.1 FL (ref 80.0–99.0)
MPV: 9.6 FL (ref 8.9–12.9)
Monocytes %: 4 % — ABNORMAL LOW (ref 5–13)
Monocytes Absolute: 0.5 10*3/uL (ref 0.0–1.0)
NRBC Absolute: 0 10*3/uL (ref 0.00–0.01)
Neutrophils %: 88 % — ABNORMAL HIGH (ref 32–75)
Neutrophils Absolute: 11 10*3/uL — ABNORMAL HIGH (ref 1.8–8.0)
Nucleated RBCs: 0 PER 100 WBC
Platelets: 470 10*3/uL — ABNORMAL HIGH (ref 150–400)
RBC: 3.93 M/uL (ref 3.80–5.20)
RDW: 13.1 % (ref 11.5–14.5)
WBC: 12.5 10*3/uL — ABNORMAL HIGH (ref 3.6–11.0)

## 2020-09-16 LAB — COMPREHENSIVE METABOLIC PANEL
ALT: 66 U/L (ref 12–78)
AST: 10 U/L — ABNORMAL LOW (ref 15–37)
Albumin/Globulin Ratio: 0.8 — ABNORMAL LOW (ref 1.1–2.2)
Albumin: 2.5 g/dL — ABNORMAL LOW (ref 3.5–5.0)
Alkaline Phosphatase: 48 U/L (ref 45–117)
Anion Gap: 7 mmol/L (ref 5–15)
BUN: 16 MG/DL (ref 6–20)
Bun/Cre Ratio: 32 — ABNORMAL HIGH (ref 12–20)
CO2: 27 mmol/L (ref 21–32)
Calcium: 8.6 MG/DL (ref 8.5–10.1)
Chloride: 106 mmol/L (ref 97–108)
Creatinine: 0.5 MG/DL — ABNORMAL LOW (ref 0.55–1.02)
EGFR IF NonAfrican American: >60 mL/min/{1.73_m2} (ref >60)
GFR African American: >60 mL/min/{1.73_m2} (ref >60)
Globulin: 3 g/dL (ref 2.0–4.0)
Glucose: 127 mg/dL — ABNORMAL HIGH (ref 65–100)
Potassium: 4.4 mmol/L (ref 3.5–5.1)
Sodium: 140 mmol/L (ref 136–145)
Total Bilirubin: 0.3 MG/DL (ref 0.2–1.0)
Total Protein: 5.5 g/dL — ABNORMAL LOW (ref 6.4–8.2)

## 2020-09-16 LAB — CBC WITH AUTOMATED DIFF
ABS. BASOPHILS: 0 10*3/uL (ref 0.0–0.1)
ABS. EOSINOPHILS: 0 10*3/uL (ref 0.0–0.4)
ABS. IMM. GRANS.: 0 10*3/uL
ABS. LYMPHOCYTES: 1 10*3/uL (ref 0.8–3.5)
ABS. MONOCYTES: 0.5 10*3/uL (ref 0.0–1.0)
ABS. NEUTROPHILS: 11 10*3/uL — ABNORMAL HIGH (ref 1.8–8.0)
ABSOLUTE NRBC: 0 10*3/uL (ref 0.00–0.01)
BASOPHILS: 0 % (ref 0–1)
EOSINOPHILS: 0 % (ref 0–7)
HCT: 37 % (ref 35.0–47.0)
HGB: 11.9 g/dL (ref 11.5–16.0)
IMMATURE GRANULOCYTES: 0 %
LYMPHOCYTES: 8 % — ABNORMAL LOW (ref 12–49)
MCH: 30.3 PG (ref 26.0–34.0)
MCHC: 32.2 g/dL (ref 30.0–36.5)
MCV: 94.1 FL (ref 80.0–99.0)
MONOCYTES: 4 % — ABNORMAL LOW (ref 5–13)
MPV: 9.6 FL (ref 8.9–12.9)
NEUTROPHILS: 88 % — ABNORMAL HIGH (ref 32–75)
NRBC: 0 PER 100 WBC
PLATELET: 470 10*3/uL — ABNORMAL HIGH (ref 150–400)
RBC: 3.93 M/uL (ref 3.80–5.20)
RDW: 13.1 % (ref 11.5–14.5)
WBC: 12.5 10*3/uL — ABNORMAL HIGH (ref 3.6–11.0)

## 2020-09-16 LAB — METABOLIC PANEL, COMPREHENSIVE
A-G Ratio: 0.8 — ABNORMAL LOW (ref 1.1–2.2)
ALT (SGPT): 66 U/L (ref 12–78)
AST (SGOT): 10 U/L — ABNORMAL LOW (ref 15–37)
Albumin: 2.5 g/dL — ABNORMAL LOW (ref 3.5–5.0)
Alk. phosphatase: 48 U/L (ref 45–117)
Anion gap: 7 mmol/L (ref 5–15)
BUN/Creatinine ratio: 32 — ABNORMAL HIGH (ref 12–20)
BUN: 16 MG/DL (ref 6–20)
Bilirubin, total: 0.3 MG/DL (ref 0.2–1.0)
CO2: 27 mmol/L (ref 21–32)
Calcium: 8.6 MG/DL (ref 8.5–10.1)
Chloride: 106 mmol/L (ref 97–108)
Creatinine: 0.5 MG/DL — ABNORMAL LOW (ref 0.55–1.02)
GFR est AA: >60 mL/min/{1.73_m2} (ref >60)
GFR est non-AA: >60 mL/min/{1.73_m2} (ref >60)
Globulin: 3 g/dL (ref 2.0–4.0)
Glucose: 127 mg/dL — ABNORMAL HIGH (ref 65–100)
Potassium: 4.4 mmol/L (ref 3.5–5.1)
Protein, total: 5.5 g/dL — ABNORMAL LOW (ref 6.4–8.2)
Sodium: 140 mmol/L (ref 136–145)

## 2020-09-16 MED ORDER — FLUDROCORTISONE 0.1 MG TAB
0.1 mg | ORAL_TABLET | ORAL | 0 refills | Status: DC
Start: 2020-09-16 — End: 2020-09-28

## 2020-09-16 MED ORDER — DEXAMETHASONE 6 MG TAB
6 mg | Freq: Every day | ORAL | Status: DC
Start: 2020-09-16 — End: 2020-09-16
  Administered 2020-09-16: 16:00:00 via ORAL

## 2020-09-16 MED ORDER — DEXAMETHASONE 2 MG TAB
2 mg | ORAL_TABLET | ORAL | 0 refills | Status: DC
Start: 2020-09-16 — End: 2020-09-28

## 2020-09-16 MED ORDER — MIDODRINE 2.5 MG TAB
2.5 mg | ORAL_TABLET | ORAL | 0 refills | Status: DC
Start: 2020-09-16 — End: 2020-09-28

## 2020-09-16 MED FILL — FLUDROCORTISONE 0.1 MG TAB: 0.1 mg | ORAL | Qty: 1

## 2020-09-16 MED FILL — MIDODRINE 5 MG TAB: 5 mg | ORAL | Qty: 2

## 2020-09-16 MED FILL — OLUMIANT 2 MG TABLET: 2 mg | ORAL | Qty: 2

## 2020-09-16 MED FILL — ASCORBIC ACID 500 MG TAB: 500 mg | ORAL | Qty: 1

## 2020-09-16 MED FILL — ZINC SULFATE 220 MG (50 MG ELEMENTAL ZINC) CAP: 50 mg zinc (220 mg) | ORAL | Qty: 1

## 2020-09-16 MED FILL — DEXAMETHASONE 6 MG TAB: 6 mg | ORAL | Qty: 1

## 2020-09-16 MED FILL — ENOXAPARIN 40 MG/0.4 ML SUB-Q SYRINGE: 40 mg/0.4 mL | SUBCUTANEOUS | Qty: 0.4

## 2020-09-16 MED FILL — MELATONIN 5 MG TAB, RAPID DISSOLVE: 5 mg | ORAL | Qty: 1

## 2020-09-16 MED FILL — CHOLECALCIFEROL (VITAMIN D3) 1,000 UNIT (25 MCG) TAB: ORAL | Qty: 1

## 2020-09-16 NOTE — Progress Notes (Signed)
1350: Reviewed discharge instructions with patient. Opportunity for questions provided. Patient verbalized understanding. IV and telemetry removed. Signed copy of paper discharge instructions placed in chart.

## 2020-09-16 NOTE — Progress Notes (Signed)
 09/16/2020   11:21 AM  Pt discussed in IDR rounds, medica;lly stable for DC to home today.  CM spoke w/ pt and she confirmed she will arrange her own HH if needed, CM provided a list of area agencies, explained se-pay costs can be as high as $400.00/visit, pt stated  I will do what I need to get PT, pt also explained her sister is currently staying with her to take care of her children and will stay as long as necessary to help her.  Awaiting DC order, nursing updated.  Pt has been scheduled for a PCP f/u 09/28/20  Pt is ready for DC from CM standpoint  Care Management Interventions  PCP Verified by CM: Yes  Mode of Transport at Discharge: Self  Transition of Care Consult (CM Consult): Discharge Planning  MyChart Signup: No  Discharge Durable Medical Equipment: No  Physical Therapy Consult: Yes  Occupational Therapy Consult: Yes  Speech Therapy Consult: No  Support Systems: Child(ren), Other Family Member(s)  Confirm Follow Up Transport: Self  The Patient and/or Patient Representative was Provided with a Choice of Provider and Agrees with the Discharge Plan?: Yes  Freedom of Choice List was Provided with Basic Dialogue that Supports the Patient's Individualized Plan of Care/Goals, Treatment Preferences and Shares the Quality Data Associated with the Providers?: Yes  Discharge Location  Discharge Placement: Home with family assistance  Georgann Salt, BS   Case Manager    9:00 AM  CM placed TC to pt to discuss HH, pt did not answer room phone, I left a VM on her mobile  Will follow and assist  Georgann Salt, BS   Case Manager

## 2020-09-16 NOTE — Discharge Summary (Signed)
Physician Discharge Summary     Patient ID:  Teresa Allen  [id number]  44 y.o.  May 18, 1976    Admit date: 09/03/2020    Discharge date and time: 09/16/2020    Admission Diagnoses: COVID-19 [U07.1]    Discharge Diagnoses:    Active Problems:    Acute respiratory failure with hypoxia (HCC) (09/03/2020)      Pneumonia due to COVID-19 virus (09/03/2020)      Elevated liver enzymes (09/03/2020)      SIRS (systemic inflammatory response syndrome) (HCC) (09/03/2020)      COVID-19 (09/03/2020)           Hospital Course:   Ms. Bells is a 44 y.o. female w/ hx of morbid obesity otherwise no significant PMH, recent dx of COVID-19 presenting w/ dyspnea. Diagnosed w/ COVID last Thursday. Since then, has had progressively worsening dyspnea, moderately severe, associated with cough, fevers.   ??  ED workup including CTA chest showing no PE however multilobar PNA.  ??  Ms. Ansley is admitted for further evaluation and management of the following:       Acute respiratory failure with hypoxia (HCC) (09/03/2020) POA: due to Co-V-19 viral infection. Required HFNC for sometime and nearly BiPAP, but did improve and was discharged on room air.    ??  Pneumonia due to COVID-19 virus (09/03/2020) / Sepsis POA: she is unvaccinated and now with recovering severe disease. CTA neg for PE but multifocal disease; CT chest 12/06 fibrotic changes. She will complete several more days of dexamethasone taper    Hypotension: Improved. Possibly automatic dysfunction due to Covid. Discharged with midodrine and florinef taper, RTC precautions  ????  Bradycardia, sinus POA: improving.  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 Echo showed a normal LV function. TSH was normal. rec outpt sleep study if not completed already  ??  ??  Morbid obesity:??Body mass index is 58.53 kg/m??.??RF for severe COVID.??Would benefit from weight loss and dietary / lifestyle modifications    PCP: None     Consults: Cardiology and Pulmonary/Intensive  care    Condition of patient at discharge: good and improved    Discharge Exam:    Physical Exam:    Gen: Well-developed, well-nourished, in no acute distress  HEENT:  Pink conjunctivae, PERRL, hearing intact to voice, moist mucous membranes  Neck: Supple, without masses, thyroid non-tender  Resp: No accessory muscle use, clear breath sounds without wheezes rales or rhonchi  Card: No murmurs, normal S1, S2 without thrills, bruits or peripheral edema  Abd:  Soft, non-tender, non-distended, normoactive bowel sounds are present, no palpable organomegaly and no detectable hernias  Lymph:  No cervical or inguinal adenopathy  Musc: No cyanosis or clubbing  Skin: No rashes or ulcers, skin turgor is good  Neuro:  Cranial nerves are grossly intact, no focal motor weakness, follows commands appropriately  Psych:  Good insight, oriented to person, place and time, alert          Disposition: home    Patient Instructions:   Current Discharge Medication List      START taking these medications    Details   dexAMETHasone (DECADRON) 2 mg tablet Take 3 Tablets by mouth Daily (before breakfast) for 4 days, THEN 2 Tablets Daily (before breakfast) for 4 days, THEN 1 Tablet Daily (before breakfast) for 4 days.  Qty: 24 Tablet, Refills: 0      fludrocortisone (FLORINEF) 0.1 mg tablet Take 1 Tablet by mouth two (2) times a  day for 6 days, THEN 1 Tablet daily for 6 days.  Qty: 18 Tablet, Refills: 0      midodrine (PROAMATINE) 2.5 mg tablet Take 2 Tablets by mouth three (3) times daily (with meals) for 6 days, THEN 1 Tablet three (3) times daily (with meals) for 6 days.  Qty: 54 Tablet, Refills: 0           Activity: Activity as tolerated  Diet: Regular Diet  Wound Care: None needed    Approximate time spent in patient care on day of discharge: 35 min    Signed:  Glenetta Hew, MD  09/16/2020  12:19 PM

## 2020-09-28 ENCOUNTER — Telehealth: Attending: Family Medicine | Primary: Family Medicine

## 2020-09-28 ENCOUNTER — Telehealth: Admit: 2020-09-28 | Discharge: 2020-09-28 | Payer: MEDICAID | Attending: Family Medicine | Primary: Family Medicine

## 2020-09-28 DIAGNOSIS — J1282 Pneumonia due to coronavirus disease 2019: Secondary | ICD-10-CM

## 2020-09-28 DIAGNOSIS — U071 COVID-19: Secondary | ICD-10-CM

## 2020-09-28 NOTE — Addendum Note (Signed)
Addendum  Note by Oren Binet, MD at 09/28/20 1300                Author: Oren Binet, MD  Service: --  Author Type: Physician       Filed: 09/28/20 1303  Encounter Date: 09/28/2020  Status: Signed          Editor: Oren Binet, MD (Physician)          Addended by: Oren Binet on: 09/28/2020 01:03 PM    Modules accepted: Level of Service

## 2020-09-28 NOTE — Progress Notes (Signed)
Progress  Notes by Oren Binet, MD at 09/28/20 1300                Author: Oren Binet, MD  Service: --  Author Type: Physician       Filed: 09/28/20 1259  Encounter Date: 09/28/2020  Status: Signed          Editor: Oren Binet, MD (Physician)                  Teresa Allen   44 y.o. female   1976/09/28   [address]   [place] [address]   [id number]       Oren Binet, MD           Encounter Date and Time: September 28, 2020 at 12:22 PM      Consent: Teresa Allen, who was seen by synchronous (real-time) audio-video technology, and/or her healthcare decision maker, is aware that this patient-initiated, Telehealth encounter on  09/28/2020 is a billable service, with coverage as determined by her insurance carrier. She is aware that she may receive a bill and has provided verbal consent to proceed: Yes.          Chief Complaint      Patient presents with      ?  Hospital Follow Up            History of Present Illness         Teresa Allen is a 44 y.o.  female was evaluated by synchronous (real-time) audio-video technology from home, through a secure patient portal.      Admit date: 09/03/2020   ??   Discharge date and time: 09/16/2020   ??   Admission Diagnoses: COVID-19 [U07.1]   ??   Discharge Diagnoses:     Active Problems:     Acute respiratory failure with hypoxia (HCC) (09/03/2020)   ??     Pneumonia due to COVID-19 virus (09/03/2020)   ??     Elevated liver enzymes (09/03/2020)   ??     SIRS (systemic inflammatory response syndrome) (HCC) (09/03/2020)   ??     COVID-19 (09/03/2020)      Feels much better since discharge.        Still with blurry vision which started about a month ago.  Also still with fatigue.       Hypotension:  DC on midodrine and fluronef.  Completed treatment yesterday.  Home BP have been in the 110s/50s at home for the past few days.        Elevated LFTs:  Trended down prior to discharge.       Thrombocytosis:  Trended down prior to DC.       States she snores.   No formal dx of OSA.       Moved from [facility] 12/2018 and has not established care with a PCP since moving.        Bradycardia: EKG reviewed:  HR 39 with TWI in III and avF.  HR now within normal range.  Normal ECHO.          I personally reviewed CT Chest 09/14/20.  Signifcant fibrotic changes and findings consistent with COVID-19.       ??   CONTRAST: None.   ??   TECHNIQUE:  5 mm axial images were obtained through the chest. Coronal and   sagittal reformats were generated.  CT dose reduction was achieved through use   of a standardized protocol  tailored for this examination and automatic exposure   control for dose modulation.   ??   The absence of intravenous contrast reduces the sensitivity for evaluation of   the mediastinum, hila, vasculature, and upper abdominal organs.   ??   FINDINGS:   ??   CHEST WALL: No mass or axillary lymphadenopathy.   THYROID: No nodule.   MEDIASTINUM: No mass or lymphadenopathy.   HILA: No mass or lymphadenopathy.   THORACIC AORTA: No aneurysm.   MAIN PULMONARY ARTERY: Normal in caliber.   TRACHEA/BRONCHI: Patent.   ESOPHAGUS: No wall thickening or dilatation.   HEART: Normal in size.   PLEURA: No effusion or pneumothorax.   LUNGS: Progressive disease with increased septal thickening and consolidation   with decrease groundglass opacities   INCIDENTALLY IMAGED UPPER ABDOMEN: Cholecystectomy   BONES: No destructive bone lesion.   ??   IMPRESSION   Evolution of Covid 19 pneumonia with increased septal thickening and fibrotic   changes as well as consolidation with decreased groundglass opacities.      Review of Systems         ROS      Vitals/Objective:            General:  alert, cooperative, no distress      Mental  status:  mental status: alert, oriented to person, place, and time, normal mood, behavior, speech, dress, motor activity, and thought processes      Resp:  resp: normal effort and no respiratory distress      Neuro:  neuro: no gross deficits      Skin:  skin: no discoloration or  lesions of concern on visible areas         Due to this being a TeleHealth evaluation, many elements of the physical examination are unable to be assessed.         Assessment and Plan:               1. Pneumonia due to COVID-19 virus   -Doing better.  Comfortable today.  Follow clinically.        2. Snoring   - SLEEP MEDICINE REFERRAL      3. BMI 60.0-69.9, adult (HCC)   - SLEEP MEDICINE REFERRAL      4. Hospital discharge follow-up      5. Elevated liver enzymes- [place] ABD COMP; Future      6. Hypotension, unspecified hypotension type      7. Fatigue, unspecified type   - SLEEP MEDICINE REFERRAL      8. Bradycardia      9. Menorrhagia with irregular cycle      Follow up with Cardiology 10/14/19.  Patient to reach out to prior PCP and look for prior records of labs and immunizations to bring in to clinic.  BP and HR better.        Patient states she maybe due for a PAP and is 5 years out with IUD, Mirena.  Wants another one placed. Prior OCPs have migraines.  Will need appt to remove IUD and replace this.      Follow up with sleep medicine in about 2 months.       Get [place] of liver for elevated LFTs.  Likely has fatty liver.       Time spent in direct conversation with the patient to include medical condition(s) discussed, assessment and treatment plan:      Patient encounter was >45 minutes was spent of total time spent on  the date of the encounter: preparing to see the patient(reviewing prior notes and tests), obtaining and/or reviewing separately obtained history, performing a medially appropriate examination  and/or evaluation, counseling and educating the patient, ordering referrals tests, documenting clinical information in the electronic or other health record, independently interpreting results(not separately reported) and communicating results to the  patient and care coordination(not separately reported) for follow up appointments.            We discussed the expected course, resolution and complications of the  diagnosis(es) in detail.  Medication risks, benefits, costs, interactions, and alternatives were discussed as indicated.  I advised  her to contact the office if her condition worsens, changes or fails to improve as anticipated.  She expressed understanding with the diagnosis(es) and plan. Patient understands that this encounter was a temporary measure, and the importance of further follow up and examination was emphasized.   Patient verbalized understanding.         Patient informed to follow up:    Follow-up and Dispositions    ??        Return in about 1 month (around 10/29/2020) for In person annual wellness exam with green team resident physician.                  Electronically Signed: Oren Binetimothy J Teresa Kates, MD      CPT Codes [phone] for Established Patients may apply to this Telehealth Visit.  POS code: 5002.  Modifier GT      Teresa JabsBrigitte Allen is a 44 y.o. female who was evaluated by an audio-video encounter for concerns as above. Patient identification was verified prior to start of the visit . A caregiver was present when appropriate. Due to this being a Scientist, research (medical)TeleHealth encounter (During COVID-19 public health emergency) , evaluation of the following organ systems was limited: Vitals/Constitutional/EENT/Resp/CV/GI/GU/MS/Neuro/Skin/Heme-Lymph-Imm.   Pursuant to the emergency declaration under the [facility] Act and the [employer] Emergencies Act, 1135 waiver authority and the [employer]onavirus Preparedness and [employer] Supplemental Appropriations Act, this Virtual Visit was conducted, with patient's (and/or  legal guardian's) consent, to reduce the patient's risk of exposure to COVID-19 and provide necessary medical care.       Services were provided through a synchronous discussion virtually to substitute for in-person clinic visit. I was at home. The patient was at home.        History         Patients past medical, surgical and family histories were reviewed and updated.        No past medical history on file.   Past Surgical  History:      Procedure  Laterality  Date      ?  HX LUMBAR FUSION                No family history on file.   Social History            Tobacco Use      ?  Smoking status:  Not on file      ?  Smokeless tobacco:  Not on file      Substance Use Topics      ?  Alcohol use:  Not on file      ?  Drug use:  Not on file            Patient Active Problem List      Diagnosis  Code      ?  Acute respiratory failure with hypoxia (HCC)  J96.01      ?  Pneumonia due to COVID-19 virus  U07.1, J12.82      ?  Elevated liver enzymes  R74.8      ?  SIRS (systemic inflammatory response syndrome) (HCC)  R65.10      ?  COVID-19  U07.1                    Current Medications/Allergies        Medications and Allergies reviewed:         No Known Allergies

## 2020-10-06 ENCOUNTER — Inpatient Hospital Stay: Admit: 2020-10-06 | Payer: MEDICAID | Attending: Family Medicine | Primary: Family Medicine

## 2020-10-06 DIAGNOSIS — R748 Abnormal levels of other serum enzymes: Secondary | ICD-10-CM

## 2020-10-06 NOTE — Progress Notes (Signed)
Good news your liver appear normal on ultrasound as well as other organs seen on ultrasound.

## 2020-10-13 ENCOUNTER — Ambulatory Visit: Attending: Specialist | Primary: Family Medicine

## 2020-10-13 ENCOUNTER — Encounter

## 2020-10-13 ENCOUNTER — Ambulatory Visit: Admit: 2020-10-13 | Discharge: 2020-10-13 | Payer: MEDICAID | Attending: Specialist | Primary: Family Medicine

## 2020-10-13 DIAGNOSIS — R000 Tachycardia, unspecified: Secondary | ICD-10-CM

## 2020-10-13 NOTE — Progress Notes (Signed)
 Teresa Allen is a 45 y.o. female    Visit Vitals  BP 110/60 (BP 1 Location: Left upper arm, BP Patient Position: Standing, BP Cuff Size: Large adult)   Pulse (!) 114   Ht 5' 2 (1.575 m)   Wt 344 lb (156 kg)   SpO2 94%   BMI 62.92 kg/m       Chief Complaint   Patient presents with   . Hypotension   . Post-COVID Symptoms     Vitals:    10/13/20 1517 10/13/20 1531   BP: 118/74 110/60   BP 1 Location: Left upper arm Left upper arm   BP Patient Position: Lying Standing   BP Cuff Size: Large adult Large adult   Pulse: 93 (!) 114   Height: 5' 2 (1.575 m)    Weight: 344 lb (156 kg)    SpO2: 97% 94%         Chest pain NO  SOB YES  Dizziness NO  Swelling FEET, LEFT MORE THAN RIGHT  Recent hospital visit [facility] 09/03/20- 09/16/20 COVID  Refills NO  COVID VACCINE STATUS NO  HAD COVID? YES

## 2020-10-13 NOTE — Progress Notes (Signed)
Progress Notes by Vennie Homans, MD at 10/13/20 1520                Author: Vennie Homans, MD  Service: --  Author Type: Physician       Filed: 10/13/20 1604  Encounter Date: 10/13/2020  Status: Signed          Editor: Vennie Homans, MD (Physician)                                       CARDIOLOGY OFFICE NOTE      Jeannette How. Leialoha Hanna, MD, [address], [place] [address]   Phone [phone]; Fax [phone]   Mobile [phone]   Voice Mail [phone]      Primary care: Sid Falcon, NP          ATTENTION:    This medical record was transcribed using an electronic medical records/speech recognition system.  Although proofread, it may and can contain electronic, spelling and other errors.  Corrections may be executed at a later time.  Please feel free to  contact us for any clarifications as needed.         No diagnosis found.             Kimesha Claxton is a 45 y.o.  female with  referred for bradycardia and hypotension            Cardiac risk factors: obesity   I have personally obtained the history from the patient.        HISTORY OF PRESENTING ILLNESS        Fancy Dunkley is a 45 y.o. female who was admitted with Covid.  She had episodes of bradycardia and hypotension during that hospital stay.  She was discharged on midodrine but  is no longer taking that.  She occasionally still has episodes where she feels as though she is may be just a little dizzy and suggest that her blood pressure is going down.  These of apparently decreased in frequency.  She is not on oxygen at this time.         ACTIVE PROBLEM LIST          Patient Active Problem List           Diagnosis  Date Noted         ?  Acute respiratory failure with hypoxia ([place])  09/03/2020     ?  Pneumonia due to COVID-19 virus  09/03/2020     ?  Elevated liver enzymes  09/03/2020     ?  SIRS (systemic inflammatory response syndrome) ([place])  09/03/2020         ?  COVID-19  09/03/2020                   PAST MEDICAL HISTORY        No past medical history on file.             PAST SURGICAL HISTORY          Past Surgical History:         Procedure  Laterality  Date          ?  HX LUMBAR FUSION                     ALLERGIES  No Known Allergies            FAMILY HISTORY        No family history on file. negative for cardiac disease            SOCIAL HISTORY          Social History          Socioeconomic History         ?  Marital status:  DIVORCED       Tobacco Use         ?  Smoking status:  Never Smoker         ?  Smokeless tobacco:  Never Used                MEDICATIONS          No current outpatient medications on file.          No current facility-administered medications for this visit.           I have reviewed the nurses notes, vitals, problem list, allergy list, medical history, family, social history and medications.            REVIEW OF SYMPTOMS     Positive per HPI   General: Pt denies excessive weight gain or loss. Pt is able to conduct ADL's   HEENT: Denies blurred vision, headaches, hearing loss, epistaxis and difficulty swallowing.   Respiratory: Denies cough, congestion, shortness of breath, DOE, wheezing or stridor.   Cardiovascular: Denies precordial pain, palpitations, edema or PND   Gastrointestinal: Denies poor appetite, indigestion, abdominal pain or blood in stool   Genitourinary: Denies hematuria, dysuria, increased urinary frequency   Musculoskeletal: Denies joint pain or swelling from muscles or joints   Neurologic: Denies tremor, paresthesias, headache, or sensory motor disturbance   Psychiatric: Denies confusion, insomnia, depression   Integumentray: Denies rash, itching or ulcers.   Hematologic: Denies easy bruising, bleeding         PHYSICAL EXAMINATION           Vitals:           10/13/20 1517  10/13/20 1531         BP:  118/74  110/60     Pulse:  93  (!) 114     SpO2:  97%  94%     Weight:  344 lb (156 kg)           Height:  5' 2"  (1.575 m)          General: Morbidly obese well  developed, in no acute distress.   HEENT: No jaundice, oral mucosa moist, no oral ulcers   Neck: Supple, no stiffness, no lymphadenopathy, supple   Heart:   Rate is regular when she was sitting down   Respiratory: Clear bilaterally x 4, no wheezing or rales   Extremities:  No edema, normal cap refill, no cyanosis.   Musculoskeletal: No clubbing, no deformities   Neuro: A&Ox3, speech clear, gait stable, cooperative, no focal neurologic deficits   Skin: Skin color is normal. No rashes or lesions. Non diaphoretic, moist.   Vascular: 2+ pulses symmetric in all extremities                  DIAGNOSTIC DATA        1. Echo   09/10/20- EF 55-60%               LABORATORY DATA  Lab Results         Component  Value  Date/Time            WBC  12.5 (H)  09/16/2020 12:53 AM       HGB  11.9  09/16/2020 12:53 AM       HCT  37.0  09/16/2020 12:53 AM       PLATELET  470 (H)  09/16/2020 12:53 AM            MCV  94.1  09/16/2020 12:53 AM           Lab Results         Component  Value  Date/Time            Sodium  140  09/16/2020 12:53 AM       Potassium  4.4  09/16/2020 12:53 AM       Chloride  106  09/16/2020 12:53 AM       CO2  27  09/16/2020 12:53 AM       Anion gap  7  09/16/2020 12:53 AM            Glucose  127 (H)  09/16/2020 12:53 AM            BUN  16  09/16/2020 12:53 AM       Creatinine  0.50 (L)  09/16/2020 12:53 AM       BUN/Creatinine ratio  32 (H)  09/16/2020 12:53 AM       GFR est AA  >60  09/16/2020 12:53 AM       GFR est non-AA  >60  09/16/2020 12:53 AM       Calcium  8.6  09/16/2020 12:53 AM       Bilirubin, total  0.3  09/16/2020 12:53 AM       Alk. phosphatase  48  09/16/2020 12:53 AM       Protein, total  5.5 (L)  09/16/2020 12:53 AM       Albumin  2.5 (L)  09/16/2020 12:53 AM       Globulin  3.0  09/16/2020 12:53 AM       A-G Ratio  0.8 (L)  09/16/2020 12:53 AM            ALT (SGPT)  66  09/16/2020 12:53 AM                  ASSESSMENT/RECOMMENDATIONS:.        1.  Bradycardia and hypotension   -They  did orthostatics on her and really did not change significantly she is no longer on the midodrine   -We will place an event loop monitor on her and see if she does develop bradycardia and episodes of tachycardia this may be just the autonomic dysfunction that occurred with Covid and will improve with time.   -TSH was normal in the past   2.  Morbid obesity  -we talked about the importance of weight loss down the road.  Her BMI is in the 60s and this is typically not taken care of through diet but rather requires bariatric surgery which she is looking to in the past   3.  History of Covid infection   -She did appear to have residence increasing resolution of her groundglass appearance on her chest x-ray unsure if she needs any follow-up on her CT scan   4. Apnea noted in the hospital will arrange for sleep evaluation   5.  Screening  cholesterol   -Gave her a lab requisition   6.  Up with me in 6 to 8 weeks      No orders of the defined types were placed in this encounter.         We discussed the expected course, resolution and complications of the diagnosis(es) in detail.  Medication risks, benefits, costs, interactions, and alternatives were discussed as indicated.  I advised him to contact the office if his condition worsens,  changes or fails to improve as anticipated. He expressed understanding with the diagnosis(es) and plan                Follow-up and Dispositions    ??       Return in about 6 months (around 04/12/2021).                    I have discussed the diagnosis with  Prescilla Sours and the intended  plan as seen in the above orders.  Questions were answered concerning future plans.  I have discussed medication side effects and warnings with the patient as well.      Thank you,  Sid Falcon, NP for involving me in the care of  Lurlene Ronda . Please do not hesitate to contact me for further questions/concerns.             Semir Brill A.Nevea Spiewak,  MD, [facility]       [place], [address]      [place], [place]       [phone] / [phone] Fax                  ??

## 2020-10-13 NOTE — Progress Notes (Signed)
Dr Lorella Nimrod pt needs loop for brady/tach

## 2020-10-14 ENCOUNTER — Encounter

## 2020-10-14 ENCOUNTER — Institutional Professional Consult (permissible substitution): Payer: MEDICAID | Primary: Family Medicine

## 2020-10-14 NOTE — Progress Notes (Signed)
C[facility] loop mailed per Dr Lorella Nimrod dx: brady, tachy.  Chargeable visit.

## 2020-10-23 NOTE — Progress Notes (Signed)
Your cholesterol numbers are not at goal. To provide you with the best heart health the LDL should be under 100 if you have no heart disease or history of diabetes. If you have a history of diabetes or heart disease the LDL goal should be less than 70.     I would favor exercise and eating less red meat and rechecking your cholesterol again in 3 mo.

## 2020-10-24 LAB — HEPATIC FUNCTION PANEL
ALT (SGPT): 18 IU/L (ref 0–32)
ALT: 18 IU/L (ref 0–32)
AST (SGOT): 15 IU/L (ref 0–40)
AST: 15 IU/L (ref 0–40)
Albumin: 3.8 g/dL (ref 3.8–4.8)
Albumin: 3.8 g/dL (ref 3.8–4.8)
Alk. phosphatase: 92 IU/L (ref 44–121)
Alkaline Phosphatase: 92 IU/L (ref 44–121)
Bilirubin, Direct: <0.1 mg/dL (ref 0.00–0.40)
Bilirubin, direct: <0.1 mg/dL (ref 0.00–0.40)
Bilirubin, total: 0.2 mg/dL (ref 0.0–1.2)
Protein, total: 6.6 g/dL (ref 6.0–8.5)
Total Bilirubin: 0.2 mg/dL (ref 0.0–1.2)
Total Protein: 6.6 g/dL (ref 6.0–8.5)

## 2020-10-24 LAB — LIPID PANEL
Cholesterol, Total: 209 mg/dL — ABNORMAL HIGH (ref 100–199)
Cholesterol, total: 209 mg/dL — ABNORMAL HIGH (ref 100–199)
HDL Cholesterol: 52 mg/dL (ref >39)
HDL: 52 mg/dL (ref >39)
LDL Calculated: 135 mg/dL — ABNORMAL HIGH (ref 0–99)
LDL, calculated: 135 mg/dL — ABNORMAL HIGH (ref 0–99)
Triglyceride: 122 mg/dL (ref 0–149)
Triglycerides: 122 mg/dL (ref 0–149)
VLDL, calculated: 22 mg/dL (ref 5–40)
VLDL: 22 mg/dL (ref 5–40)

## 2020-10-24 LAB — CVD REPORT

## 2020-10-26 ENCOUNTER — Encounter

## 2020-10-30 ENCOUNTER — Ambulatory Visit: Attending: Specialist | Primary: Family Medicine

## 2020-10-30 ENCOUNTER — Ambulatory Visit: Admit: 2020-10-30 | Discharge: 2020-10-30 | Payer: MEDICAID | Attending: Specialist | Primary: Family Medicine

## 2020-10-30 DIAGNOSIS — G4733 Obstructive sleep apnea (adult) (pediatric): Secondary | ICD-10-CM

## 2020-10-30 NOTE — Progress Notes (Signed)
Progress Notes by Charlestine Night, MD at 10/30/20 1100                Author: Charlestine Night, MD  Service: --  Author Type: Physician       Filed: 10/30/20 1229  Encounter Date: 10/30/2020  Status: Signed          Editor: Charlestine Night, MD (Physician)                                             [address] [place], [place] [address]   Tel.  [phone]   Fax. [phone]  [address]   [place], [place] [address]   Tel.  [phone]   Fax. [phone]  [address]   [place], [place] [address]   Tel.  [phone]   Fax. [phone]             Chief Complaint            Chief Complaint       Patient presents with        ?  Sleep Problem             NP; ref Dr Rushie Chestnut; tachycardia; eval for OSA             HPI         Teresa Allen is 45 y.o.  female seen for evaluation of a sleep disorder.  Recent admission for COVID.  During hospitalization  noted episodes of hypotension and bradycardia.  Referred for potential sleep disordered breathing.      Normally retires between 11-11: 30 PM and will awaken with alarm between 6 :15-6: 30 AM.  She may awaken 4-5 times during the night.  She is often tired on awakening.  She may have headaches on awakening.      She may doze if she is seated and inactive such as when reading.  Otherwise, denies excessive daytime sleepiness.  Does not have vivid dreaming or nightmares, sleep talking or sleepwalking, bruxism or nocturnal incontinence, abnormal arm or leg movements,  hypnagogue hallucinations, sleep paralysis or cataplexy.         The patient has not undergone diagnostic testing for the current problems.             Epworth Sleepiness Score: 7         No Known Allergies             She  has no past medical history on file.      She  has a past surgical history that includes hx lumbar fusion.      She family history is not on file.      She  reports that she has never smoked. She has never used smokeless tobacco.        Review  of Systems:   ROS       Due to this being a telemedicine evaluation, certain elements of the physical examination are unable to be assessed.     Objective:        Visit Vitals      Ht  5\' 2"  (1.575 m)     Wt  344 lb (156 kg)        BMI  62.92 kg/m??        Body mass index is 62.92 kg/m??.  General:    Conversant, cooperative     Eyes:  no nystagmus     Oropharynx:    Mallampati II, tongue scalloped                                       Skin:   no obvious rashes     Neuro:   Speech fluent, face symmetrical, tongue movement normal        Psych:   Normal affect,  normal countenance              Assessment:                  ICD-10-CM  ICD-9-CM             1.  OSA (obstructive sleep apnea)   G47.33  327.23       2.  Class 3 severe obesity due to excess calories with serious comorbidity and body mass index (BMI) of 60.0 to 69.9 in adult (HCC)   E66.01  278.01              Z68.44  V85.44             3.  History of COVID-19   Z86.16  V12.09             History consistent with sleep disordered breathing.  Evaluation with a sleep study, split per criteria.  Weight reduction will be beneficial.  Patient was advised that weight loss measures often more effective when sleep disordered breathing concurrently  treated.           Plan:        No orders of the defined types were placed in this encounter.         * Patient has a history and examination consistent with the diagnosis of sleep apnea.   * Sleep testing was ordered for initial evaluation.     * She was provided information on sleep apnea including corresponding risk factors and the importance of proper treatment.    * Treatment options if indicated were reviewed today.        Instructions:   o  The patient would benefit from weight reduction measures.   o  Do not engage in activities requiring a normal degree of alertness if fatigue is present.   o  The patient understands that untreated or undertreated sleep apnea could impair judgement and the ability to function  normally during the day.   o  Call or return if symptoms worsen or persist.               Valla Leaver, MD, FAASM   Electronically signed 10/30/20          This note was created using voice recognition software. Despite editing, there may be syntax errors.  This note will not be viewable  in MyChart.

## 2020-11-25 ENCOUNTER — Ambulatory Visit: Attending: Family Medicine | Primary: Family Medicine

## 2020-11-25 ENCOUNTER — Ambulatory Visit: Admit: 2020-11-25 | Discharge: 2020-11-25 | Payer: MEDICAID | Attending: Family Medicine | Primary: Family Medicine

## 2020-11-25 DIAGNOSIS — Z0000 Encounter for general adult medical examination without abnormal findings: Secondary | ICD-10-CM

## 2020-11-25 NOTE — Progress Notes (Signed)
HPI:  Teresa Allen is a 45 y.o. female presenting for well woman exam.     Chief Complaint   Patient presents with   ??? Complete Physical     Patient is coming in for a complete physical. She did have COVID back in Novmeber. She got a pap last 2 years ago. She ha san IUD placed since 2016 and would like it replaced. No other concerns.      Acute concerns:  ?? No acute concerns  ?? History of COVID infection. She was diagnosed COVID infection att he end of November 2022, was hospitalized to [facility] for almost 2 weeks. She developed hypotension and tachycardia during the hospitalization. She is followed by the cardiologist, currently wearing the Holter monitoring. Has follow up appointment in 1 month. She continues to have significant fatigue affecting her daily activities. 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   ?? Significant hair loss after recovering from COVID.   ?? Getting eval with sleep medicine for possible OSA.       Staying home, home.     GYN hx:   [id number]  Not having any periods since Mirena was placed in 2016 (Placed at OB/GYN office in [place])  Not sexually active currently   Last PAP a long time ago with GYN, normal per patient.       Social:  -Single mom, lives with 4 kids  -No tobacco or ETOH use  -Used to work as an Airline pilot but had to quit due to ongoing recovery    Diet: Variable, she is not eating much since her appetite is not good, primary eats veggies/salads with meat, avoiding carbs    Exercise: Not currently exercising     Family hx:  No hx of Ovarian, breast or colon cancer        Allergies- reviewed:   No Known Allergies      Medications- reviewed:   No current outpatient medications on file.     No current facility-administered medications for this visit.         Past Medical History- reviewed:  History reviewed. No pertinent past medical history.      Past Surgical History- reviewed:   Past Surgical History:   Procedure Laterality Date   ??? HX LUMBAR  FUSION     ??? HX TONSILLECTOMY  1980's   ??? IR CHOLECYSTOSTOMY PERCUTANEOUS  2012         Family History - reviewed:  History reviewed. No pertinent family history.      Social History - reviewed:  Social History     Socioeconomic History   ??? Marital status: DIVORCED     Spouse name: Not on file   ??? Number of children: Not on file   ??? Years of education: Not on file   ??? Highest education level: Not on file   Occupational History   ??? Not on file   Tobacco Use   ??? Smoking status: Never Smoker   ??? Smokeless tobacco: Never Used   Substance and Sexual Activity   ??? Alcohol use: Not on file   ??? Drug use: Not on file   ??? Sexual activity: Not on file   Other Topics Concern   ??? Not on file   Social History Narrative   ??? Not on file     Social Determinants of Health     Financial Resource Strain:    ??? Difficulty of Paying Living  Expenses: Not on file   Food Insecurity:    ??? Worried About Programme researcher, broadcasting/film/video in the Last Year: Not on file   ??? Ran Out of Food in the Last Year: Not on file   Transportation Needs:    ??? Lack of Transportation (Medical): Not on file   ??? Lack of Transportation (Non-Medical): Not on file   Physical Activity:    ??? Days of Exercise per Week: Not on file   ??? Minutes of Exercise per Session: Not on file   Stress:    ??? Feeling of Stress : Not on file   Social Connections:    ??? Frequency of Communication with Friends and Family: Not on file   ??? Frequency of Social Gatherings with Friends and Family: Not on file   ??? Attends Religious Services: Not on file   ??? Active Member of Clubs or Organizations: Not on file   ??? Attends Banker Meetings: Not on file   ??? Marital Status: Not on file   Intimate Partner Violence:    ??? Fear of Current or Ex-Partner: Not on file   ??? Emotionally Abused: Not on file   ??? Physically Abused: Not on file   ??? Sexually Abused: Not on file   Housing Stability:    ??? Unable to Pay for Housing in the Last Year: Not on file   ??? Number of Places Lived in the Last Year: Not on file    ??? Unstable Housing in the Last Year: Not on file         Immunizations - reviewed:     There is no immunization history on file for this patient.  Flu: Declined today         Review of Systems   CONSTITUTIONAL: Denies: fever, chills, +fatigue, +hair loss  EYES: Denies: blurry vision, decreased vision  CARDIOVASCULAR: Denies: chest pain, dyspnea on exertion  RESPIRATORY: Denies: cough, shortness of breath  GI: Denies: abdominal pain, flank pain  GU: Denies: dysuria, frequency/urgency      Physical Exam  Visit Vitals  BP 105/71 (BP 1 Location: Right upper arm, BP Patient Position: Sitting)   Pulse 84   Temp 98.1 ??F (36.7 ??C) (Oral)   Resp 16   Ht 5\' 2"  (1.575 m)   Wt (!) 352 lb (159.7 kg)   SpO2 97%   BMI 64.38 kg/m??       General appearance - alert, well appearing, and in no distress  Eyes - pupils equal and reactive, extraocular eye movements intact  Ears - bilateral TM's and external ear canals normal  Neck - supple, no significant adenopathy  Chest - clear to auscultation, no wheezes, rales or rhonchi, symmetric air entry  Heart - normal rate, regular rhythm, normal S1, S2, no murmurs, rubs, clicks or gallops  Abdomen - soft, nontender, nondistended, no masses or organomegaly  Neurological - alert, oriented, normal speech, no focal findings or movement disorder noted  Musculoskeletal - no joint tenderness, deformity or swelling  Extremities - peripheral pulses normal, no pedal edema, no clubbing or cyanosis  Skin - normal coloration and turgor, no rashes, no suspicious skin lesions noted      Assessment/Plan:    ICD-10-CM ICD-9-CM    1. Well woman exam without gynecological exam  Z00.00 V70.0 CBC W/O DIFF      HEMOGLOBIN A1C WITH EAG      HEPATITIS C AB      HEPATITIS C AB  HEMOGLOBIN A1C WITH EAG      CBC W/O DIFF      CANCELED: METABOLIC PANEL, COMPREHENSIVE      CANCELED: LIPID PANEL   2. Encounter for hepatitis C screening test for low risk patient  Z11.59 V73.89 HEPATITIS C AB      HEPATITIS C AB   3.  Influenza vaccination declined  Z28.21 V64.06    4. History of COVID-19  Z86.16 V12.09    5. History of bradycardia  Z87.898 V12.59    6. 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 Discussed with the patient that all the symptoms of fatigue, hair loss is likely due to PostCOVID syndrome which she slowly recovering from . Counseled about the regular diet with small and frequent portions of the food.   ?? Follow up with cardiology for hypotension and tachycardia. VSS today. They already discussed the possibility of adding the medication (Like Midodrine) so will defer to cardiologist.     ?? The patient is interested for removal of re-insertion of IUD (Mirena). The form completed, will notify the patient when its ready.   ?? Due for PAP, can be done at the time if IUD removal/re-insertion.           I have discussed the diagnosis with the patient and the intended plan as seen in the above orders. Patient verbalized understanding of the plan and agrees with the plan. The patient has received an after-visit summary and questions were answered concerning future plans.  I have discussed medication side effects and warnings with the patient as well. Informed patient to return to the office if new symptoms arise.        Mathis Bud, MD  Family Medicine Attending

## 2020-11-25 NOTE — Progress Notes (Signed)
HepC NR.  CBC WNL.  HgA1C 5.1.   Notified the patient via Mychart.

## 2020-11-25 NOTE — Telephone Encounter (Signed)
Faxed over IUD paperwork to [phone]

## 2020-11-25 NOTE — Progress Notes (Signed)
Shandricka Manzione is a 45 y.o. female    Chief Complaint   Patient presents with   . Complete Physical     Patient is coming in for a complete physical. She did have COVID back in Novmeber. She got a pap last 2 years ago. She ha san IUD placed since 2016 and would like it replaced. No other concerns.        1. Have you been to the ER, urgent care clinic since your last visit?  Hospitalized since your last visit?No  2. Have you seen or consulted any other health care providers outside of the [facility] since your last visit?  Include any pap smears or colon screening. No      Visit Vitals  BP 105/71 (BP 1 Location: Right upper arm, BP Patient Position: Sitting)   Pulse 84   Temp 98.1 F (36.7 C) (Oral)   Resp 16   Ht 5\' 2"  (1.575 m)   Wt (!) 352 lb (159.7 kg)   SpO2 97%   BMI 64.38 kg/m           Health Maintenance Due   Topic Date Due   . Hepatitis C Screening  Never done   . Depression Screen  Never done   . COVID-19 Vaccine (1) Never done   . DTaP/Tdap/Td series (1 - Tdap) Never done   . Cervical cancer screen  Never done   . Flu Vaccine (1) Never done         Medication Reconciliation completed, changes noted.  Please  Update medication list.

## 2020-11-27 LAB — CBC
Hematocrit: 43.9 % (ref 35.0–47.0)
Hemoglobin: 13.9 g/dL (ref 11.5–16.0)
MCH: 30.2 PG (ref 26.0–34.0)
MCHC: 31.7 g/dL (ref 30.0–36.5)
MCV: 95.2 FL (ref 80.0–99.0)
MPV: 9.8 FL (ref 8.9–12.9)
NRBC Absolute: 0 10*3/uL (ref 0.00–0.01)
Nucleated RBCs: 0 PER 100 WBC
Platelets: 343 10*3/uL (ref 150–400)
RBC: 4.61 M/uL (ref 3.80–5.20)
RDW: 12.6 % (ref 11.5–14.5)
WBC: 5.7 10*3/uL (ref 3.6–11.0)

## 2020-11-27 LAB — HEMOGLOBIN A1C W/EAG
Hemoglobin A1C: 5.1 % (ref 4.0–5.6)
eAG: 100 mg/dL

## 2020-11-27 LAB — HEPATITIS C ANTIBODY: Hepatitis C Ab: NONREACTIVE

## 2020-11-27 LAB — CBC W/O DIFF
ABSOLUTE NRBC: 0 10*3/uL (ref 0.00–0.01)
HCT: 43.9 % (ref 35.0–47.0)
HGB: 13.9 g/dL (ref 11.5–16.0)
MCH: 30.2 PG (ref 26.0–34.0)
MCHC: 31.7 g/dL (ref 30.0–36.5)
MCV: 95.2 FL (ref 80.0–99.0)
MPV: 9.8 FL (ref 8.9–12.9)
NRBC: 0 PER 100 WBC
PLATELET: 343 10*3/uL (ref 150–400)
RBC: 4.61 M/uL (ref 3.80–5.20)
RDW: 12.6 % (ref 11.5–14.5)
WBC: 5.7 10*3/uL (ref 3.6–11.0)

## 2020-11-27 LAB — HEPATITIS C AB: Hep C virus Ab Interp.: NONREACTIVE

## 2020-11-27 LAB — HEMOGLOBIN A1C WITH EAG
Est. average glucose: 100 mg/dL
Hemoglobin A1c: 5.1 % (ref 4.0–5.6)

## 2020-12-03 ENCOUNTER — Ambulatory Visit: Attending: Specialist | Primary: Family Medicine

## 2020-12-03 ENCOUNTER — Ambulatory Visit: Admit: 2020-12-03 | Discharge: 2020-12-03 | Payer: MEDICAID | Attending: Specialist | Primary: Family Medicine

## 2020-12-03 DIAGNOSIS — R000 Tachycardia, unspecified: Secondary | ICD-10-CM

## 2020-12-03 NOTE — Progress Notes (Signed)
 Visit Vitals  BP 98/60   Pulse 84   Ht 5' 2 (1.575 m)   Wt (!) 350 lb 6.4 oz (158.9 kg)   BMI 64.09 kg/m

## 2020-12-03 NOTE — Progress Notes (Signed)
Progress Notes by Vennie Homans, MD at 12/03/20 1120                Author: Vennie Homans, MD  Service: --  Author Type: Physician       Filed: 12/03/20 1218  Encounter Date: 12/03/2020  Status: Signed          Editor: Vennie Homans, MD (Physician)                                       CARDIOLOGY OFFICE NOTE      Teresa How. Jamillah Camilo, MD, [address], [place] [address]   Phone [phone]; Fax [phone]   Mobile [phone]   Voice Mail [phone]      Primary care: Teresa Girt, MD          ATTENTION:    This medical record was transcribed using an electronic medical records/speech recognition system.  Although proofread, it may and can contain electronic, spelling and other errors.  Corrections may be executed at a later time.  Please feel free to  contact us for any clarifications as needed.                  ICD-10-CM  ICD-9-CM          1.  Tachycardia   R00.0  785.0          2.  Bradycardia   R00.1  427.89                  Teresa Allen is a 45 y.o.  female with  referred for bradycardia and hypotension            Cardiac risk factors: obesity   I have personally obtained the history from the patient.        HISTORY OF PRESENTING ILLNESS        Teresa Allen is a 45 y.o. female who was admitted with Covid.  She had episodes of bradycardia and hypotension during that hospital stay.  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  She does have some fluid retention but this is most likely due to her BMI of 65.  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         ACTIVE PROBLEM LIST          Patient Active Problem List           Diagnosis  Date Noted         ?  Acute respiratory failure with hypoxia ([place])  09/03/2020     ?  Pneumonia due to COVID-19 virus  09/03/2020     ?  Elevated liver enzymes  09/03/2020     ?  SIRS (systemic inflammatory response syndrome) ([place])   09/03/2020         ?  COVID-19  09/03/2020                  PAST MEDICAL HISTORY        No past medical history on file.             PAST SURGICAL HISTORY          Past Surgical History:         Procedure  Laterality  Date          ?  HX LUMBAR FUSION         ?  HX TONSILLECTOMY    1980's          ?  IR CHOLECYSTOSTOMY PERCUTANEOUS    2012                 ALLERGIES        No Known Allergies            FAMILY HISTORY        No family history on file. negative for cardiac disease            SOCIAL HISTORY          Social History          Socioeconomic History         ?  Marital status:  DIVORCED       Tobacco Use         ?  Smoking status:  Never Smoker         ?  Smokeless tobacco:  Never Used                MEDICATIONS          No current outpatient medications on file.          No current facility-administered medications for this visit.           I have reviewed the nurses notes, vitals, problem list, allergy list, medical history, family, social history and medications.            REVIEW OF SYMPTOMS     Positive per HPI   General: Pt denies excessive weight gain or loss. Pt is able to conduct ADL's   HEENT: Denies blurred vision, headaches, hearing loss, epistaxis and difficulty swallowing.   Respiratory: Denies cough, congestion, shortness of breath, DOE, wheezing or stridor.   Cardiovascular: Denies precordial pain, palpitations, edema or PND   Gastrointestinal: Denies poor appetite, indigestion, abdominal pain or blood in stool   Genitourinary: Denies hematuria, dysuria, increased urinary frequency   Musculoskeletal: Denies joint pain or swelling from muscles or joints   Neurologic: Denies tremor, paresthesias, headache, or sensory motor disturbance   Psychiatric: Denies confusion, insomnia, depression   Integumentray: Denies rash, itching or ulcers.   Hematologic: Denies easy bruising, bleeding         PHYSICAL EXAMINATION           Vitals:          12/03/20 1135        BP:  98/60     Pulse:  84     Weight:   (!) 350 lb 6.4 oz (158.9 kg)        Height:  5' 2"  (1.575 m)        General: Morbidly obese well developed, in no acute distress.   HEENT: No jaundice, oral mucosa moist, no oral ulcers   Neck: Supple, no stiffness, no lymphadenopathy, supple   Heart:   Rate is regular when she was sitting down   Respiratory: Clear bilaterally x 4, no wheezing or rales   Extremities:  No edema, normal cap refill, no cyanosis.   Musculoskeletal: No clubbing, no deformities   Neuro: A&Ox3, speech clear, gait stable, cooperative, no focal neurologic deficits   Skin: Skin color is normal. No rashes or lesions. Non diaphoretic, moist.                  DIAGNOSTIC DATA  1. Echo   09/10/20- EF 55-60%      2. Lipids   10/23/20- TC 209, HDL 52, LDL 135, TG 122               LABORATORY DATA                    Lab Results         Component  Value  Date/Time            WBC  5.7  11/25/2020 03:25 PM       HGB  13.9  11/25/2020 03:25 PM       HCT  43.9  11/25/2020 03:25 PM       PLATELET  343  11/25/2020 03:25 PM            MCV  95.2  11/25/2020 03:25 PM           Lab Results         Component  Value  Date/Time            Sodium  140  09/16/2020 12:53 AM       Potassium  4.4  09/16/2020 12:53 AM       Chloride  106  09/16/2020 12:53 AM       CO2  27  09/16/2020 12:53 AM       Anion gap  7  09/16/2020 12:53 AM       Glucose  127 (H)  09/16/2020 12:53 AM       BUN  16  09/16/2020 12:53 AM       Creatinine  0.50 (L)  09/16/2020 12:53 AM       BUN/Creatinine ratio  32 (H)  09/16/2020 12:53 AM       GFR est AA  >60  09/16/2020 12:53 AM       GFR est non-AA  >60  09/16/2020 12:53 AM       Calcium  8.6  09/16/2020 12:53 AM       Bilirubin, total  0.2  10/23/2020 02:43 PM       Alk. phosphatase  92  10/23/2020 02:43 PM       Protein, total  6.6  10/23/2020 02:43 PM       Albumin  3.8  10/23/2020 02:43 PM       Globulin  3.0  09/16/2020 12:53 AM       A-G Ratio  0.8 (L)  09/16/2020 12:53 AM            ALT (SGPT)  18  10/23/2020 02:43 PM                   ASSESSMENT/RECOMMENDATIONS:.        1.  Bradycardia and hypotension   -Currently off all medicine and blood pressure is low but she states blood pressures chronically been low even during the delivery of her children they need to give her epinephrine.   -We will place an event loop monitor demonstrated no critical or serious events and 8 stable events all of which were sinus rhythm on one occasion and states that it was atrial fibrillation but on one portion it seemed to march out appropriately and the  last 3 beats were regular.   -TSH was normal in the past   2.  Morbid obesity  -we talked about the importance of weight loss down the road.  Her BMI is in the 60s and this is typically  not taken care of through diet but rather requires bariatric surgery    3.  History of Covid infection   4. Apnea    -Sleep study needs to be done in the office it cannot be done as an outpatient and she is arranging this   5.  Screening cholesterol   -LDL is not at goal and is 135   -She will work on weight reduction and reducing red meat intake   -LDL goal is under 100     Lab Results         Component  Value  Date/Time            Cholesterol, total  209 (H)  10/23/2020 02:43 PM       HDL Cholesterol  52  10/23/2020 02:43 PM       LDL, calculated  135 (H)  10/23/2020 02:43 PM       VLDL, calculated  22  10/23/2020 02:43 PM            Triglyceride  122  10/23/2020 02:43 PM     6.  Possible A. Fib   -There is a very brief episode that may be atrial fibrillation and it occurred early in the morning around 6 AM even when I reviewed it did not look to be significant and it might have only been two beats.   -She does have a monitor attached to her phone so she will follow her rhythm regularly.   -Atrial  Fibrillation CHADSVASC2 Score  Stroke Risk:       45 y.o.  <65        + 0         female  Female +1        CHF HX:  No    + 0     HTN HX:  No    + 0     Stroke/TIA/Thromboembolism  No    +0     Vascular Disease HX:  No    + 0      Diabetes Mellitus  No    + 0     CHADSVASC 2 Score  1      Annual Stroke Risk 0.6% - low-moderate risk                   6.  Up with me in  66month or as needed      No orders of the defined types were placed in this encounter.         We discussed the expected course, resolution and complications of the diagnosis(es) in detail.  Medication risks, benefits, costs, interactions, and alternatives were discussed as indicated.  I advised him to contact the office if his condition worsens,  changes or fails to improve as anticipated. He expressed understanding with the diagnosis(es) and plan                Follow-up and Dispositions    ??       Return in about 6 months (around 06/02/2021).                    I have discussed the diagnosis with  BPrescilla Soursand the intended  plan as seen in the above orders.  Questions were answered concerning future plans.  I have discussed medication side effects and warnings with the patient as well.      Thank you,  BGeradine Girt MD for involving me in the  care of  Teresa Allen . Please do not hesitate to contact me for further questions/concerns.             Mickeal Daws A.Delorice Bannister,  MD, [facility]       [place], [place]      [place], [place]       [phone] / [phone] Fax                  ??

## 2020-12-11 NOTE — Telephone Encounter (Signed)
 Patient called and reported that Dr.K would like Sleep Study done per last appointment. No order ever entered, notes just say evaluation during sleep study

## 2020-12-15 NOTE — Telephone Encounter (Signed)
Called IUD team to check the status of Patient's IUD.   [facility] said that they received a rejection from the insurance because apparently she has a alternative insurance.   Tried to contact patient to call her insurance and get this correct.  Could not leave a voicemail.

## 2020-12-29 NOTE — Telephone Encounter (Signed)
[facility] [facility] said that they received a rejection from the insurance because apparently she has a alternative insurance.   Tried to contact patient to call her insurance and get this correct.  LVM to patient

## 2021-06-02 ENCOUNTER — Ambulatory Visit: Attending: Specialist | Primary: Family Medicine

## 2021-06-02 ENCOUNTER — Ambulatory Visit: Admit: 2021-06-02 | Discharge: 2021-06-02 | Payer: MEDICAID | Attending: Specialist | Primary: Family Medicine

## 2021-06-02 DIAGNOSIS — R000 Tachycardia, unspecified: Secondary | ICD-10-CM

## 2021-06-02 NOTE — Progress Notes (Signed)
Room: B5    -Better, still short of breath even when not doing anything     Visit Vitals  BP 106/66 (BP 1 Location: Left upper arm, BP Patient Position: Sitting, BP Cuff Size: Large adult)   Pulse 76   Ht 5\' 2"  (1.575 m)   Wt (!) 362 lb (164.2 kg)   SpO2 97%   BMI 66.21 kg/m         Chest pain: no  Shortness of breath: YES  Dizziness: no  Palpitations/Racing Heart: no  Swelling: YES     New diagnosis/Surgeries since your last visit: no    Hospitalizations since your last visit: no    Refills: no

## 2021-06-02 NOTE — Progress Notes (Signed)
Progress Notes by Vennie Homans, MD at 06/02/21 1520                Author: Vennie Homans, MD  Service: --  Author Type: Physician       Filed: 06/02/21 1612  Encounter Date: 06/02/2021  Status: Signed          Editor: Vennie Homans, MD (Physician)                                       CARDIOLOGY OFFICE NOTE      Jeannette How. Cosmo Tetreault, MD, [address], [place] [address]   Phone [phone]; Fax [phone]   Mobile [phone]   Voice Mail [phone]      Primary care: Geradine Girt, MD          ATTENTION:    This medical record was transcribed using an electronic medical records/speech recognition system.  Although proofread, it may and can contain electronic, spelling and other errors.  Corrections may be executed at a later time.  Please feel free to  contact us for any clarifications as needed.                  ICD-10-CM  ICD-9-CM          1.  Tachycardia   R00.0  785.0          2.  Bradycardia   R00.1  427.89                  Sherrey North is a 45 y.o.  female with  referred for bradycardia and hypotension            Cardiac risk factors: obesity, dyslipidemia   I have personally obtained the history from the patient.        HISTORY OF PRESENTING ILLNESS        Jeanise Durfey is a 45 y.o. female who was admitted with Covid in the past.  She had episodes of bradycardia and hypotension during that hospital stay.  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  She does have some fluid retention but this is most likely due to her BMI of 65.  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   She states that she never had a sleep study because nobody called her back.  She does remain short of breath and can occur even when she is not active.  Risk factors for heart disease are minimal except for her weight and her dyslipidemia.  She wants  a referral to bariatric clinic and I am  in agreement.         ACTIVE PROBLEM LIST          Patient Active Problem List           Diagnosis  Date Noted         ?  Acute respiratory failure with hypoxia ([place])  09/03/2020     ?  Pneumonia due to COVID-19 virus  09/03/2020     ?  Elevated liver enzymes  09/03/2020         ?  SIRS (systemic inflammatory response syndrome) ([place])  09/03/2020         ?  COVID-19  09/03/2020  PAST MEDICAL HISTORY        History reviewed. No pertinent past medical history.             PAST SURGICAL HISTORY          Past Surgical History:         Procedure  Laterality  Date          ?  HX LUMBAR FUSION         ?  HX TONSILLECTOMY    1980's          ?  IR CHOLECYSTOSTOMY PERCUTANEOUS    2012                 ALLERGIES        No Known Allergies            FAMILY HISTORY        History reviewed. No pertinent family history. negative for cardiac disease            SOCIAL HISTORY          Social History          Socioeconomic History         ?  Marital status:  DIVORCED       Tobacco Use         ?  Smoking status:  Never     ?  Smokeless tobacco:  Never       Substance and Sexual Activity         ?  Drug use:  Never                MEDICATIONS          Current Outpatient Medications        Medication  Sig         ?  Biotin 2,500 mcg cap  Take  by mouth.     ?  multivitamin (ONE A DAY) tablet  Take 1 Tablet by mouth daily.         ?  TURMERIC PO  Take  by mouth as needed.          No current facility-administered medications for this visit.           I have reviewed the nurses notes, vitals, problem list, allergy list, medical history, family, social history and medications.            REVIEW OF SYMPTOMS     Positive per HPI   General: Pt denies excessive weight gain or loss. Pt is able to conduct ADL's   HEENT: Denies blurred vision, headaches, hearing loss, epistaxis and difficulty swallowing.   Respiratory: Denies cough, congestion, shortness of breath, DOE, wheezing or stridor.   Cardiovascular: Denies precordial pain,  palpitations, edema or PND   Gastrointestinal: Denies poor appetite, indigestion, abdominal pain or blood in stool   Genitourinary: Denies hematuria, dysuria, increased urinary frequency   Musculoskeletal: Denies joint pain or swelling from muscles or joints   Neurologic: Denies tremor, paresthesias, headache, or sensory motor disturbance   Psychiatric: Denies confusion, insomnia, depression   Integumentray: Denies rash, itching or ulcers.   Hematologic: Denies easy bruising, bleeding         PHYSICAL EXAMINATION           Vitals:          06/02/21 1525        BP:  106/66     Pulse:  76     SpO2:  97%     Weight:  (!) 362 lb (164.2 kg)        Height:  5' 2"  (1.575 m)        General: Morbidly obese well developed, in no acute distress.   HEENT: No jaundice, oral mucosa moist, no oral ulcers   Neck: Supple, no stiffness, no lymphadenopathy, supple   Heart:   Rate is regular when she was sitting down   Respiratory: Clear bilaterally x 4, no wheezing or rales   Extremities:  No edema, normal cap refill, no cyanosis.   Musculoskeletal: No clubbing, no deformities   Neuro: A&Ox3, speech clear, gait stable, cooperative, no focal neurologic deficits   Skin: Skin color is normal. No rashes or lesions. Non diaphoretic, moist.                  DIAGNOSTIC DATA        1. Echo   09/10/20- EF 55-60%      2. Lipids   10/23/20- TC 209, HDL 52, LDL 135, TG 122      3. Loop   1/10-11/17/20- SR/SB average 84, min 52, max 143               LABORATORY DATA                    Lab Results         Component  Value  Date/Time            WBC  5.7  11/25/2020 03:25 PM       HGB  13.9  11/25/2020 03:25 PM       HCT  43.9  11/25/2020 03:25 PM       PLATELET  343  11/25/2020 03:25 PM            MCV  95.2  11/25/2020 03:25 PM           Lab Results         Component  Value  Date/Time            Sodium  140  09/16/2020 12:53 AM       Potassium  4.4  09/16/2020 12:53 AM       Chloride  106  09/16/2020 12:53 AM       CO2  27  09/16/2020 12:53 AM        Anion gap  7  09/16/2020 12:53 AM       Glucose  127 (H)  09/16/2020 12:53 AM       BUN  16  09/16/2020 12:53 AM       Creatinine  0.50 (L)  09/16/2020 12:53 AM       BUN/Creatinine ratio  32 (H)  09/16/2020 12:53 AM       GFR est AA  >60  09/16/2020 12:53 AM       GFR est non-AA  >60  09/16/2020 12:53 AM       Calcium  8.6  09/16/2020 12:53 AM       Bilirubin, total  0.2  10/23/2020 02:43 PM       Alk. phosphatase  92  10/23/2020 02:43 PM       Protein, total  6.6  10/23/2020 02:43 PM       Albumin  3.8  10/23/2020 02:43 PM       Globulin  3.0  09/16/2020 12:53 AM       A-G Ratio  0.8 (L)  09/16/2020  12:53 AM            ALT (SGPT)  18  10/23/2020 02:43 PM                  ASSESSMENT/RECOMMENDATIONS:.        1.  Bradycardia and hypotension   -event loop monitor demonstrated no critical or serious events and 8 stable events all of which were sinus rhythm on one occasion and states that it was atrial fibrillation but on one portion it seemed to march out appropriately and the last 3 beats were  regular.   -TSH was normal in the past   2.  Morbid obesity  -we talked about the importance of weight loss down the road.  Her BMI is in the 60s and this is typically not taken care of through diet but rather requires bariatric surgery    -We will give her a referral to bariatric clinic   3.  History of Covid infection/shortness of breath   -Shortness of breath has been ongoing and probably was worse after COVID.  I am not sure if this is an anginal equivalent but it occurs at rest.  We talked about doing a stress test but to hold off unless her symptoms worsen.   4. Apnea    -Sleep study not done. Will arrange through [facility]   5.  Screening cholesterol   -LDL is not at goal and is 135   -She will work on weight reduction and reducing red meat intake   -LDL goal is under 100   -We will check her cholesterol prior to her next visit and may very well consider statin if it remains elevated     Lab Results          Component  Value  Date/Time            Cholesterol, total  209 (H)  10/23/2020 02:43 PM       HDL Cholesterol  52  10/23/2020 02:43 PM       LDL, calculated  135 (H)  10/23/2020 02:43 PM       VLDL, calculated  22  10/23/2020 02:43 PM            Triglyceride  122  10/23/2020 02:43 PM     6.  Possible A. Fib   -2 episodes of irregularity and has used the alivcor and no atrial fibrillation   -Atrial Fibrillation CHADSVASC2 Score Stroke Risk:       45 y.o.  <65        + 0         female  Female +1        CHF HX:  No    + 0     HTN HX:  No    + 0     Stroke/TIA/Thromboembolism  No    +0     Vascular Disease HX:  No    + 0     Diabetes Mellitus  No    + 0        CHADSVASC 2 Score  1      Annual Stroke Risk 0.6% - low-moderate risk                   6.  Up with me in  68month or as needed        Orders Placed This Encounter        ?  Biotin 2,500 mcg cap  Sig: Take  by mouth.        ?  multivitamin (ONE A DAY) tablet             Sig: Take 1 Tablet by mouth daily.        ?  TURMERIC PO             Sig: Take  by mouth as needed.           We discussed the expected course, resolution and complications of the diagnosis(es) in detail.  Medication risks, benefits, costs, interactions, and alternatives were discussed as indicated.  I advised him to contact the office if his condition worsens,  changes or fails to improve as anticipated. He expressed understanding with the diagnosis(es) and plan                Follow-up and Dispositions    ??       Return in about 6 months (around 12/03/2021).                    I have discussed the diagnosis with  Prescilla Sours and the intended  plan as seen in the above orders.  Questions were answered concerning future plans.  I have discussed medication side effects and warnings with the patient as well.      Thank you,  Geradine Girt, MD for involving me in the care of  Vernella Niznik . Please do not hesitate to contact me for further questions/concerns.              Kassie Keng A.Halee Glynn,  MD, [facility]       [address] [place], [place]      [place], [place]       [phone] / [phone] Fax

## 2021-06-03 NOTE — Progress Notes (Signed)
Faxed request, demo and records to [facility] for eval of OSA

## 2021-06-22 ENCOUNTER — Encounter: Payer: MEDICAID | Primary: Family Medicine

## 2021-08-17 NOTE — Telephone Encounter (Signed)
Pt confirmed appt & is aware of office policies.

## 2021-08-19 ENCOUNTER — Ambulatory Visit: Admit: 2021-08-19 | Payer: MEDICAID | Attending: Nurse Practitioner | Primary: Family

## 2021-08-19 DIAGNOSIS — K219 Gastro-esophageal reflux disease without esophagitis: Secondary | ICD-10-CM

## 2021-08-19 NOTE — Progress Notes (Signed)
HISTORY OF PRESENT ILLNESS  Teresa Allen is new patient presents today for evaluation for weight loss surgery. Patient has been overweight for her whole life. Weight loss was harder after kids.   Her weight has ranged from 298- 374 lbs. Her heaviest weight is 374 lbs      Dietary Habits:    Breakfast- skip, occassional omelet  Lunch- salad, chicken  Dinner- spaghetti, tacos, soups  Liquids- coffee, water, herb tea  Snacking- doesn't snack, chips and salsa    Prior weight loss attempts: weight watchers, atkins and physician supervised diet.             Ms. Delagarza has a reminder for a "due or due soon" health maintenance. I have asked that she contact her primary care provider for follow-up on this health maintenance.      Patient Active Problem List   Diagnosis Code    Acute respiratory failure with hypoxia ([facility]) J96.01    Pneumonia due to COVID-19 virus U07.1, J12.82    Elevated liver enzymes R74.8    SIRS (systemic inflammatory response syndrome) ([facility]) R65.10    COVID-19 U07.1       No past medical history on file.      Past Surgical History:   Procedure Laterality Date    HX LUMBAR FUSION      HX TONSILLECTOMY  1980's    IR CHOLECYSTOSTOMY PERCUTANEOUS  2012         Curt Bears, [place], NP      No Known Allergies      No family history on file.    Social History     Socioeconomic History    Marital status: DIVORCED     Spouse name: Not on file    Number of children: Not on file    Years of education: Not on file    Highest education level: Not on file   Occupational History    Not on file   Tobacco Use    Smoking status: Never    Smokeless tobacco: Never   Substance and Sexual Activity    Alcohol use: Not on file    Drug use: Never    Sexual activity: Not on file   Other Topics Concern    Not on file   Social History Narrative    Not on file     Social Determinants of Health     Financial Resource Strain: Not on file   Food Insecurity: Not on file   Transportation Needs: Not on file   Physical Activity:  Not on file   Stress: Not on file   Social Connections: Not on file   Intimate Partner Violence: Not on file   Housing Stability: Not on file      HPI    Review of Systems   Constitutional:  Positive for malaise/fatigue. Negative for chills and fever.   HENT:  Positive for congestion. Negative for hearing loss, sinus pain and sore throat.    Eyes:  Negative for blurred vision, double vision, pain, discharge and redness.        Wears glasses   Respiratory:  Negative for cough, shortness of breath and wheezing.    Cardiovascular:  Positive for palpitations (occassional due to hx of COVID). Negative for chest pain.   Gastrointestinal:  Positive for diarrhea (sp lap chole) and heartburn (if she lies on right side). Negative for abdominal pain, constipation, nausea and vomiting.   Genitourinary:  Negative for dysuria, frequency and urgency.  Musculoskeletal:  Positive for back pain, joint pain and neck pain.   Skin:  Negative for itching and rash.   Neurological:  Positive for headaches. Negative for dizziness.   Psychiatric/Behavioral:  Negative for depression. The patient is nervous/anxious. The patient does not have insomnia.      Physical Exam  Constitutional:       Appearance: She is well-developed.   HENT:      Mouth/Throat:      Mouth: Mucous membranes are moist.   Cardiovascular:      Rate and Rhythm: Normal rate and regular rhythm.      Heart sounds: No murmur heard.    No friction rub. No gallop.   Pulmonary:      Effort: Pulmonary effort is normal.      Breath sounds: Normal breath sounds.   Abdominal:      General: Bowel sounds are normal. There is no distension.      Palpations: Abdomen is soft.      Tenderness: There is no abdominal tenderness.   Musculoskeletal:      Cervical back: Normal range of motion.   Skin:     General: Skin is warm and dry.   Neurological:      Mental Status: She is alert and oriented to person, place, and time.       ASSESSMENT and PLAN      The procedure of divided gastric bypass  with Roux-en-Y gastrojejunostomy was explained to the patient including the four parts of the operation- 1) loss of appetite, 2) the restrictive phases, 3) the dumping phase, 4) mild malabsorption from the diversion of pancreatic or biliary enzymes. Informed consent was obtained concerning the potential morbidities and mortality of the operation including anastomotic leak, pulmonary embolism, deep vein thrombosis, bleeding, staple- line disruption, stomal stenosis or dilatation, inadequate weight loss, and requirement for life-long vitamin intake. Further information was given concerning a three-day hospitalization with at least one or more nights in a special care unit, protein- enriched liquified diet for two-thee weeks, convalescence for three to six weeks. Information was provided concerning the team approach anesthesia, nursing, and dietary. Pneumatic stockings, Heparin or Lovenox, and wound care management techniques were explained. Abdominal pain, nausea and/or vomiting may occur if eating too fast, too much, or not chewing well enough. With sweets or high fat ingestion, dumping may occur. It was further stressed the approximately three to five percent of patients require endoscopic balloon dilatation of the staple line. Furthermore, a clear discussion of the imperfections of the operation was discussed including the fact that it not effective in every patient because of patient non-compliance and/or mechanical failure. It was hoped, however, that at approaching a ninety percent level, the patients can achieve a mean of seventy percent loss of excess body weight. This is determined partially by patient compliance and exercise as well as making wise choices for the diet.     Patient meets criteria established by the NIH. Without weight reduction, co-morbidities will escalate as well as risk of early mortality. Recommendation is patient could be served with surgical weight reduction, the procedure of Gastric  bypass. I explained to the patient differences between laparoscopic and open gastric bypass, SADI, DS and sleeve gastrectomy procedures. Patient has attended one our informational meeting and has seen our educational materials. Patient desires to have surgery with Dr. Truitt Leep.      I have reinforced without lifestyle change and behavior modification, they would not achieve his weight loss goals.  I reviewed risks and complications associated with each procedure.     Patient has been sent to [facility] for H. Pylori for  testing. Will treat based on results. Other recommendations include psychological evaluation, nutritional consultation. She will need an UGI and cardiac clearance. She will need to lose approximately 10% of her body weight, ~37 lbs    I discussed with patient a diet high in protein, low-fat, low- sugar, limited carbohydrates, and discontinuing use of carbonated beverages. Also discussed physical activity and exercises. I have answered all questions, they wish to proceed.    35 Minutes spent face to face with patient, >50 % of time spent counseling.   ** Copy to PCP and other providers

## 2021-08-19 NOTE — Progress Notes (Signed)
 Identified pt with two pt identifiers (name and DOB). Reviewed chart in preparation for visit and have obtained necessary documentation.    Teresa Allen is a 45 y.o. female  Chief Complaint   Patient presents with    New Patient    Morbid Obesity     Visit Vitals  BP 110/68 (BP 1 Location: Right lower arm, BP Patient Position: Sitting, BP Cuff Size: Large adult)   Pulse 72   Temp 98.1 F (36.7 C) (Oral)   Resp 18   Ht 5' 2 (1.575 m)   Wt (!) 374 lb 12.8 oz (170 kg)   SpO2 93%   BMI 68.55 kg/m       1. Have you been to the ER, urgent care clinic since your last visit?  Hospitalized since your last visit?No    2. Have you seen or consulted any other health care providers outside of the [facility] since your last visit?  Include any pap smears or colon screening. Yes

## 2021-08-21 LAB — H PYLORI ABS, IGA AND IGG
H PYLORI AB IGG: 0.14 Index Value (ref 0.00–0.79)
H pylori Ab IgG: 0.14 Index Value (ref 0.00–0.79)
H pylori Ab, IgA: <9 units (ref 0.0–8.9)
H. pylori Ab, IgA: <9 units (ref 0.0–8.9)

## 2021-08-30 ENCOUNTER — Inpatient Hospital Stay: Payer: MEDICAID | Attending: Body Imaging | Primary: Family

## 2021-09-22 ENCOUNTER — Inpatient Hospital Stay: Admit: 2021-09-22 | Payer: MEDICAID | Attending: Body Imaging | Primary: Family

## 2021-09-22 DIAGNOSIS — K219 Gastro-esophageal reflux disease without esophagitis: Secondary | ICD-10-CM

## 2021-10-25 ENCOUNTER — Institutional Professional Consult (permissible substitution): Admit: 2021-10-25 | Discharge: 2021-10-25 | Attending: Registered" | Primary: Family

## 2021-10-25 NOTE — Progress Notes (Signed)
 Pre-operative Bariatric Nutrition Evaluation    Date: 10/25/2021              Session 1 of 6    Insurance: [facility]            Physician/Surgeon: Dr. Lorrene Sharps      Name: Teresa Allen  DOB:  13-Jun-1976  Age:  46  Gender: Female  Type of Surgery: [x]    Gastric Bypass   []     Sleeve Gastrectomy    ASSESSMENT:    Past Medical History:  COVID    Medications/Supplements:   Prior to Admission medications    Medication Sig Start Date End Date Taking? Authorizing Provider   Biotin 2,500 mcg cap Take  by mouth.    Provider, Historical   multivitamin (ONE A DAY) tablet Take 1 Tablet by mouth daily.    Provider, Historical   TURMERIC PO Take  by mouth as needed.  Patient not taking: Reported on 08/19/2021    Provider, Historical       Smoking: None  Alcohol: None    Food Allergies/Intolerances:None    Anthropometrics:    Ht:62 inches   Wt: 374 lbs (11/10)    IBW: 110 lbs    %IBW: 340     BMI: 68   Category: Obesity class III    Reported wt history: Pt presents today for pre-op nutrition evaluation for wt loss surgery. Reports lowest adult BW of 160-180 lbs and highest adult BW of 374 lbs. Attributes wt gain over the years r/t pregnancy, physical inactivity, COVID. Has attempted wt loss through various methods with most successful wt loss of 60 lbs. Has been unable to maintain long term or significant wt loss and is now seeking approval for weight loss surgery. Pt will need to complete 6 months of supervised weight loss for insurance requirements. Surgeon recommending 37 lb weight loss before surgery.    Exercise/Physical Activity: None- body hurts (can't go grocery shopping without hurting)- feet swell, blood pressure drops    Reported Diet History: low cal meals, exercise, Weight watchers, keto    24 Hour Diet Recall  Breakfast  Skips (coffee only); eating makes nauseous   Snacks     [employer] or sandwich or salad (at work)   [employer] or crackers/cheese or granola bar or Immunologist, veggies, starch   Snacks     Beverages  Coffee w/sugar free creamer or heavy whipping cream/splenda, water , tea-splenda; diet soda occasionally- 2x week, seltzer water    Out to eat/take out 2x week. Boredom eating noted; if stressed doesn't eat    Environment/Psychosocial/Support: Pt states support consists of kids (63 yo daughter, 66 yo son), sister and brother in law, friends; son does the cooking in the household. Has friends who have had bariatric surgery. Works hybrid job.      NUTRITION DIAGNOSIS:  Food and nutrition related knowledge deficit r/t lack of prior exposure to information evidenced by pt demonstrates need for nutrition education for gastric bypass.        NUTRITION INTERVENTION:  Pt educated on nutrition recommendations for weight loss surgery, specifically gastric bypass.    Instructed on consuming 3 meals per day starting now.  Use the balanced plate method to plan meals, include 3 oz of lean source of protein, 1/2 cup whole grains, unlimited non-starchy vegetables, 1/2 cup fruit and 1 serving of low fat dairy. Utilize handouts listing healthy snack and meal ideas to limit  restaurant meals.      After surgery measure all meals to 1/2 cup. Each meal will contain a 1/4 cup lean protein and 1/4 cup fruit, non-starchy vegetable or starch (limiting to once per day). Aim for 60 g protein per day. Sip on 48-64 oz of sugar free, calorie free, non-carbonated beverages each day. Do not use a straw. Do not consume beverages 30 minutes before, during or 30 minutes after meals.     Read all nutrition labels. Demonstrated and emphasized identifying serving size, total fat, sugar and protein content. Defined low fat as </= 3 g per serving. Discussed lean and extra lean sources of protein. Provided list of low fat cooking methods. Avoid foods with sugar listed in the first 3 ingredients and >/15 g sugar per serving. Excess sugar/fat intake may lead to dumping syndrome. Discussed signs and symptoms of  dumping syndrome.     Practice mindful eating habits; take small bites, chew thoroughly, avoid distractions, utilize hunger/fullness scale. Consume meals over 20-30 minutes.     Attend Bariatric Support Group and increase physical activity (approved per MD) for long term weight maintenance.      NUTRITION MONITORING AND EVALUATION:    The following goals were established with patient;  Eat 3 meals a day- adding in bfast  Reduce carbonated beverages to 2x week  Avoid distractions while eating  4. . Review nutrition education materials. Follow up next month for continue nutrition education.        Specific tips and techniques to facilitate compliance with above recommendations were provided and discussed. Nutrition evaluation reveals important lifestyle changes are indicated. Goals set and recommendations made. Will continue to assess.  If further details are desired please feel free to contact me at [phone].  This phone number was also provided to the patient for any further questions or concerns.           Berwyn Bruns, RD

## 2021-11-29 ENCOUNTER — Institutional Professional Consult (permissible substitution): Admit: 2021-11-29 | Discharge: 2021-11-29 | Payer: MEDICAID | Attending: Registered" | Primary: Family

## 2021-11-29 NOTE — Progress Notes (Signed)
 Tree surgeon at [facility]  Supervised Weight Loss     Date:   11/29/2021    Patient's Name: Teresa Allen  DOB: [birth year]    Insurance:  [employer]          Session: 2 of  6  Surgery: Gastric bypass  Surgeon:   Dr. Lorrene Sharps    Height: 62 inches Weight:    374      Lbs (last months wt).   BMI: 68   Pounds Lost since last month: 0               Pounds Gained since last month: 0    Starting Weight: 374 lbs   Previous Month's Weight: 374 lbs  Overall Pounds Lost: 0 Overall Pounds Gained: 0    Other Pertinent Information: Today's appointment was completed in a virtual setting d/t COVID-19.  Surgeon recommending 37 lb weight loss before surgery.       Smoking Status:  None  Alcohol Intake: None    I have reviewed with pt the guidelines of the supervised wt loss program.  Pt understands the expectations of some wt loss during the program and that wt gain could delay the process. I have also explained that appointments need to be consecutive and missing an appointment may result in starting over. Pt has received this information in writing.          Changes that patient has made since last month include:  reduced carbonated beverages to 2x week, eating bfast more often, less distracted eating.      Eating Habits and Behaviors  General healthy eating guidelines were also discussed. Pts were instructed that their plate should be made up 1/2 plate coming from non-starchy vegetables, 1/4 coming from lean meat, and 1/4 of their plate coming from carbohydrates, including fruits, starches, or milk.  We discussed measuring meals to 1/2 cup total per meal after surgery. Drinking only calorie-free, sugar-free and non-carbonated beverages. We discussed the importance of drinking 64 ounces of fluid per day to prevent dehydration post-operatively.                 Physical Activity/Exercise  We talked about the importance of increasing daily physical activity and beginning to develop an exercise  regimen/routine. We talked about exercise as being an important part of long term weight loss after surgery.     Comments:  During class, I discussed with patient the importance of getting into an exercise routine.  Pt is currently not exercising.  Pt has been encouraged to start a consistent exercise program.      Behavior Modification    A behavior modification lesson was provided with an emphasis on developing mindful eating behaviors. We talked about how to eat more mindfully and identify emotional eating triggers. Tips and recommendations for how to make these changes were provided. Pt was encouraged to keep a food journal and record what they were taking in daily.       Reviewed vitamins; review protein next appt    Overall Assessment: Nutrition evaluation reveals small lifestyle changes are being made. Goals set and recommendations made. Will continue to assess.    Patient-Set Goals:   1. Nutrition - eat bfast daily; eliminated carbonated beverages  2. Exercise - try chair exercises  3. Behavior - obtain current wt; try another protein shake from list    Berwyn Bruns, RD  11/29/2021

## 2021-12-01 ENCOUNTER — Ambulatory Visit: Attending: Specialist | Primary: Family

## 2021-12-01 ENCOUNTER — Ambulatory Visit: Admit: 2021-12-01 | Discharge: 2021-12-01 | Payer: MEDICAID | Attending: Specialist | Primary: Family

## 2021-12-01 DIAGNOSIS — R000 Tachycardia, unspecified: Secondary | ICD-10-CM

## 2021-12-01 MED ORDER — ROSUVASTATIN 10 MG TAB
10 mg | ORAL_TABLET | Freq: Every evening | ORAL | 5 refills | Status: AC
Start: 2021-12-01 — End: ?

## 2021-12-01 NOTE — Progress Notes (Signed)
Progress Notes by Rushie Chestnut, MD at 12/01/21 1440                Author: Rushie Chestnut, MD  Service: --  Author Type: Physician       Filed: 12/01/21 1603  Encounter Date: 12/01/2021  Status: Signed          Editor: Rushie Chestnut, MD (Physician)                                       CARDIOLOGY OFFICE NOTE      Redge Gainer. Ivanka Kirshner, MD, [facility]      [address], [address], [place], [place] [address]   Phone [phone]; Fax [phone]   Mobile [phone]   Voice Mail [phone]      Primary care: Teresa Hearing, NP          ATTENTION:    This Teresa record was transcribed using an electronic Teresa records/speech recognition system.  Although proofread, it may and can contain electronic, spelling and other errors.  Corrections may be executed at a later time.  Please feel free to  contact us for any clarifications as needed.                  ICD-10-CM  ICD-9-CM          1.  Tachycardia   R00.0  785.0     2.  Bradycardia   R00.1  427.89          3.  Class 3 severe obesity due to excess calories with serious comorbidity in adult, unspecified BMI (HCC)   E66.01  278.01                  Teresa Allen is a 46 y.o.  female with  referred for bradycardia and hypotension            Cardiac risk factors: obesity, dyslipidemia   I have personally obtained the history from the patient.        HISTORY OF PRESENTING ILLNESS        Teresa Allen is a 45y.o. female who was admitted with Covid in the past.  She had episodes of bradycardia and hypotension during that hospital stay.  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  She does have some fluid retention but this is most likely due to her BMI of 65.  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   She is having a work-up done in the bariatric clinic that included a sleep study now she is wearing her CPAP machine regularly.  She did gain some weight but just  back from a cruise.  She is motivated to lose the weight somewhat tearful.         ACTIVE PROBLEM LIST          Patient Active Problem List           Diagnosis  Date Noted         ?  Acute respiratory failure with hypoxia (HCC)  09/03/2020     ?  Pneumonia due to COVID-19 virus  09/03/2020     ?  Elevated liver enzymes  09/03/2020     ?  SIRS (systemic inflammatory response syndrome) (HCC)  09/03/2020         ?  COVID-19  09/03/2020                  PAST Teresa HISTORY          Past Teresa History:        Diagnosis  Date         ?  Sleep apnea                    PAST SURGICAL HISTORY          Past Surgical History:         Procedure  Laterality  Date          ?  HX LUMBAR FUSION              removal of 20 % of disc          ?  HX TONSILLECTOMY    1980's          ?  IR CHOLECYSTOSTOMY PERCUTANEOUS    2012                 ALLERGIES        No Known Allergies            FAMILY HISTORY        History reviewed. No pertinent family history. negative for cardiac disease            SOCIAL HISTORY          Social History          Socioeconomic History         ?  Marital status:  DIVORCED       Tobacco Use         ?  Smoking status:  Never     ?  Smokeless tobacco:  Never       Substance and Sexual Activity         ?  Alcohol use:  Never         ?  Drug use:  Never                MEDICATIONS          Current Outpatient Medications        Medication  Sig         ?  Biotin 2,500 mcg cap  Take  by mouth.     ?  multivitamin (ONE A DAY) tablet  Take 1 Tablet by mouth daily.         ?  TURMERIC PO  Take  by mouth as needed.          No current facility-administered medications for this visit.           I have reviewed the nurses notes, vitals, problem list, allergy list, Teresa history, family, social history and medications.            REVIEW OF SYMPTOMS     Positive per HPI   General: Pt denies excessive weight gain or loss. Pt is able to conduct ADL's   HEENT: Denies blurred vision, headaches, Allen loss, epistaxis and  difficulty swallowing.   Respiratory: Denies cough, congestion, shortness of breath, DOE, wheezing or stridor.   Cardiovascular: Denies precordial pain, palpitations, edema or PND   Gastrointestinal: Denies poor appetite, indigestion, abdominal pain or blood in stool   Genitourinary: Denies hematuria, dysuria, increased urinary frequency   Musculoskeletal: Denies joint pain or swelling from muscles or joints   Neurologic: Denies tremor, paresthesias,  headache, or sensory motor disturbance   Psychiatric: Denies confusion, insomnia, depression   Integumentray: Denies rash, itching or ulcers.   Hematologic: Denies easy bruising, bleeding         PHYSICAL EXAMINATION           Vitals:          12/01/21 1454        BP:  110/80     Weight:  (!) 371 lb (168.3 kg)        Height:  5\' 2"  (1.575 m)        General: Morbidly obese well developed, in no acute distress.   HEENT: No jaundice   Neck: Supple, no stiffness   Heart:   Rate is regular when she was sitting down   Respiratory: Clear bilaterally x 4, no wheezing or rales   Extremities:  No edema, normal cap refill, no cyanosis.   Musculoskeletal: No clubbing, no deformities   Neuro: A&Ox3, speech clear, gait stable, cooperative, no focal neurologic deficits   Skin: Skin color is normal. No rashes or lesions. Non diaphoretic, moist.                  DIAGNOSTIC DATA        1. Echo   09/10/20- EF 55-60%      2. Lipids   10/23/20- TC 209, HDL 52, LDL 135, TG 122      3. Loop   1/10-11/17/20- SR/SB average 84, min 52, max 143               LABORATORY DATA                    Lab Results         Component  Value  Date/Time            WBC  5.7  11/25/2020 03:25 PM       HGB  13.9  11/25/2020 03:25 PM       HCT  43.9  11/25/2020 03:25 PM       PLATELET  343  11/25/2020 03:25 PM            MCV  95.2  11/25/2020 03:25 PM           Lab Results         Component  Value  Date/Time            Sodium  140  09/16/2020 12:53 AM       Potassium  4.4  09/16/2020 12:53 AM       Chloride  106   09/16/2020 12:53 AM       CO2  27  09/16/2020 12:53 AM       Anion gap  7  09/16/2020 12:53 AM       Glucose  127 (H)  09/16/2020 12:53 AM       BUN  16  09/16/2020 12:53 AM       Creatinine  0.50 (L)  09/16/2020 12:53 AM       BUN/Creatinine ratio  32 (H)  09/16/2020 12:53 AM       GFR est AA  >60  09/16/2020 12:53 AM       GFR est non-AA  >60  09/16/2020 12:53 AM       Calcium  8.6  09/16/2020 12:53 AM       Bilirubin, total  0.2  10/23/2020 02:43 PM       Alk. phosphatase  92  10/23/2020 02:43 PM       Protein, total  6.6  10/23/2020 02:43 PM       Albumin  3.8  10/23/2020 02:43 PM       Globulin  3.0  09/16/2020 12:53 AM       A-G Ratio  0.8 (L)  09/16/2020 12:53 AM            ALT (SGPT)  18  10/23/2020 02:43 PM         ECG 11/11/2021: Normal sinus rhythm         ASSESSMENT/RECOMMENDATIONS:.        1.  Bradycardia and hypotension   -event loop monitor demonstrated no critical or serious events and 8 stable events all of which were sinus rhythm on one occasion and states that it was atrial fibrillation but on one portion it seemed to march out appropriately and the last 3 beats were  regular.   - No issues with slow heart rate   2.  Morbid obesity  -we talked about the importance of weight loss down the road.  Her BMI is in the 60s and this is typically not taken care of through diet but rather requires bariatric surgery    -bariatric clinic work up in progress   3.  History of Covid infection/shortness of breath   -Shortness of breath has been ongoing and probably was worse after COVID.   -she is still having SOB    4. Apnea    -Sleep study not done and is now on CPAP   5.  Screening cholesterol   -LDL is not at goal and is 657   -crestor 10 mg a day and recheck labs in 2 mo.     Lab Results         Component  Value  Date/Time            Cholesterol, total  209 (H)  10/23/2020 02:43 PM       HDL Cholesterol  52  10/23/2020 02:43 PM       LDL, calculated  135 (H)  84/69/6295 02:43 PM       VLDL, calculated  22   10/23/2020 02:43 PM            Triglyceride  122  10/23/2020 02:43 PM     6.  Possible A. Fib   -2 episodes of irregularity and has used the alivcor and no atrial fibrillation   -Atrial Fibrillation CHADSVASC2 Score Stroke Risk:       46 y.o.  <65        + 0         female  Female +1        CHF HX:  No    + 0     HTN HX:  No    + 0     Stroke/TIA/Thromboembolism  No    +0     Vascular Disease HX:  No    + 0     Diabetes Mellitus  No    + 0        CHADSVASC 2 Score  1      Annual Stroke Risk 0.6% - low-moderate risk                   6.  Up with me in  6months or as needed        Orders Placed This Encounter        ?  AMB POC EKG ROUTINE W/ 12 LEADS, INTER & REP              Order Specific Question:    Reason for Exam:              Answer:    TACHYCARDIA           We discussed the expected course, resolution and complications of the diagnosis(es) in detail.  Medication risks, benefits, costs, interactions, and alternatives were discussed as indicated.  I advised him to contact the office if his condition worsens,  changes or fails to improve as anticipated. He expressed understanding with the diagnosis(es) and plan                            I have discussed the diagnosis with  Teresa Allen and the intended  plan as seen in the above orders.  Questions were answered concerning future plans.  I have discussed medication side effects and warnings with the patient as well.      Thank you,  Teresa Hearing, NP for involving me in the care of  Teresa Allen . Please do not hesitate to contact me for further questions/concerns.             Lanessa Shill A.Almer Littleton,  MD, [facility]         Teresa Allen   Teresa Allen       [address] [place], [address]      [place], [place]  [address]       [phone] / [phone] Fax

## 2021-12-01 NOTE — Progress Notes (Signed)
Teresa Allen is a 46 y.o. female    Vitals:    12/01/21 1454   BP: 110/80   BP 1 Location: Left upper arm   BP Patient Position: Sitting   BP Cuff Size: Adult   Height: 5\' 2"  (1.575 m)   Weight: (!) 371 lb (168.3 kg)       Chief Complaint   Patient presents with    Irregular Heart Beat     TACHY/BRADY       Chest pain NO  SOB NO  Dizziness YES  Swelling YES  Recent hospital visit NO  Refills NO  COVID VACCINE YES  HAD COVID? YES

## 2021-12-07 ENCOUNTER — Encounter: Attending: Specialist | Primary: Family Medicine

## 2021-12-27 ENCOUNTER — Institutional Professional Consult (permissible substitution): Admit: 2021-12-27 | Discharge: 2021-12-27 | Attending: Registered" | Primary: Family

## 2021-12-27 NOTE — Progress Notes (Signed)
 Tree surgeon at [facility]  Supervised Weight Loss     Date:   12/27/2021    Patient's Name: Teresa Allen  DOB: 02-11-1976    Insurance:  UHC          Session: 3 of  6  Surgery: Gastric bypass                  Surgeon:   Dr. Lorrene Sharps     Height: 62 inches       Weight:    387      Lbs (Pt stated)                              BMI:  70             Pounds Lost since last month: 0               Pounds Gained since last month: 13 lbs     Starting Weight: 374 lbs                   Previous Month's Weight: 374 lbs  Overall Pounds Lost: 0        Overall Pounds Gained: 0     Other Pertinent Information: Today's appointment was completed in a virtual setting d/t COVID-19.  Surgeon recommending 37 lb weight loss before surgery.        Smoking Status:  None  Alcohol Intake: None       I have reviewed with pt the guidelines of the supervised wt loss program.  Pt understands the expectations of some wt loss during the program and that wt gain could delay the process. I have also explained that appointments need to be consecutive and missing an appointment may result in starting over. Pt has received this information in writing.          Changes that patient has made since last month include:  eating bfast daily, no carbonated beverages, started chair exercises.      Eating Habits and Behaviors  General healthy eating guidelines were discussed. A nutrition lesson was presented on portion control. Patients were instructed implement portion control now using the balanced plate method (1/2 plate non-starchy vegetables, 1/4 plate lean meat, and 1/4 plate whole grains and to include fruit and/or milk at meals or snack). We discussed measuring meals to 1/2 cup total per meal after surgery and appropriate portion progression long term.                       Patient's current diet habits include: Pt is eating 3 meals per day (B: protein shake- Atkins, Fairlife; L: chicken salad or charcuterie  meats/cheese/olives/veggies or sandwich; D: Spanish or tacos or spaghetti or chicken, salad). Snack choices include Atkins protein bar or pork rinds . Pt is eating refined carbohydrate foods (bread, pasta, rice, potatoes) occasionally. Pt is eating sweets/desserts occasionally. Pt is using healthy/unhealthy cooking methods. Pt is eating meals prepared outside of the home 3x week- more celebratory eating (typically 2x week). Pt is drinking water , coffee, herb tea. Pt reports emotional eating- discouraged with weight.         Physical Activity/Exercise  We talked about the importance of increasing daily physical activity and beginning to develop an exercise regimen/routine. We talked about exercise as being an important part of long term weight loss after surgery.  Comments:  During class, I discussed with patient the importance of getting into an exercise routine.  Pt is currently doing chair exercises, pedal for upper body inconsistently for activity.  Pt has been encouraged to exercise consistently.     Behavior Modification       We talked about how to eat more mindfully. Tips and recommendations for how to make these changes were provided. Pt was encouraged to keep a food journal and record what they were taking in daily.     Reviewed protein; needs to do psych eval    Overall Assessment: Nutrition evaluation reveals small lifestyle changes are being made. Goals set and recommendations made. Will continue to assess.       Patient-Set Goals:   1. Nutrition - continue with 3 meals a day  2. Exercise - walk 5 min a day, 2x day  3. Behavior -start logging food (myfitness pal or Lose It app)    Berwyn Bruns, RD  12/27/2021

## 2022-01-24 ENCOUNTER — Institutional Professional Consult (permissible substitution): Admit: 2022-01-24 | Discharge: 2022-01-24 | Attending: Registered" | Primary: Family

## 2022-01-24 NOTE — Progress Notes (Signed)
Tree surgeon at [facility]  Supervised Weight Loss     Date:   01/24/2022    Patient's Name: Teresa Allen  DOB: 08-22-1976    Insurance:  UHC          Session: 4 of  6  Surgery: Gastric bypass                  Surgeon:   Dr. Truitt Leep     Height: 62 inches       Weight:    383     Lbs (Pt stated)                              BMI:  70             Pounds Lost since last month: 4 lbs               Pounds Gained since last month: 0     Starting Weight: 374 lbs                   Previous Month's Weight: 387 lbs  Overall Pounds Lost: 0        Overall Pounds Gained: 9 lbs     Other Pertinent Information: Today's appointment was completed in a virtual setting d/t COVID-19.  Surgeon recommending 37 lb weight loss before surgery.        Smoking Status:  None  Alcohol Intake: None       I have reviewed with pt the guidelines of the supervised wt loss program.  Pt understands the expectations of some wt loss during the program and that wt gain could delay the process. I have also explained that appointments need to be consecutive and missing an appointment may result in starting over. Pt has received this information in writing.          Changes that patient has made since last month include:  started logging food intake (baritastic app), tried chair exercises, planning ahead with meals.      Eating Habits and Behaviors  General healthy eating guidelines were also discussed. Pts were instructed that their plate should be made up 1/2 plate coming from non-starchy vegetables, 1/4 coming from lean meat, and 1/4 of their plate coming from carbohydrates, including fruits, starches, or milk. We discussed measuring meals to 1/2 cup total per meal after surgery. Drinking only calorie-free, sugar-free and non-carbonated beverages. We discussed the importance of drinking 64 ounces of fluid per day to prevent dehydration post-operatively.                       Patient's current diet habits include: Pt is  eating 3 meals per day (B: protein shake- Premier, Fairlife; L: ham, salads; chicken salad or deli meats/cheese/olives/veggies or sandwich; D: Spanish or tacos or spaghetti or chicken, salad). Snack choices include nuts, string cheese . Pt is eating refined carbohydrate foods (bread, pasta, rice, potatoes) occasionally. Pt is eating sweets/desserts occasionally. Pt is using healthy/unhealthy cooking methods. Pt is eating meals prepared outside of the home 2x week. Pt is drinking water, coffee, herb tea. Pt reports less emotional eating          Physical Activity/Exercise  An exercise presentation was provided including information about exercise programs available both before and after surgery. We talked about the importance of increasing daily physical activity and beginning to develop an exercise  regimen/routine. We talked about exercise as being an important part of long term weight loss after surgery.     Comments:  During class, I discussed with patient the importance of getting into an exercise routine.  Pt is currently walking around the house and some chair exercises for activity.  Pt has been encouraged to increase exercise as tolerated.     Behavior Modification       We talked about how to eat more mindfully. Tips and recommendations for how to make these changes were provided. Pt was encouraged to keep a food journal and record what they were taking in daily.     Reviewed food labels, dumping syndrome.    Overall Assessment:  Nutrition evaluation reveals small lifestyle changes are being made. Goals set and recommendations made. Will continue to assess.    Patient-Set Goals:   1. Nutrition - continue with 3 meals a day-protein at each meal  2. Exercise - continue walking 2x day for 5 min  3. Behavior -continue to track food intake; read food labels for total fat and added sugars    Carmela Hurt, RD  01/24/2022

## 2022-02-02 NOTE — Telephone Encounter (Signed)
Telephone  Encounter by Ree Shay at 02/02/22 1322                Author: Ree Shay  Service: --  Author Type: --       Filed: 02/02/22 1323  Encounter Date: 02/02/2022  Status: Signed          Editor: Sula Soda patient and advised that I didn't receive her email for the psych eval. I looked in my email and junk mail and other folders with no luck. Pt is going to send through MyChart.

## 2022-02-02 NOTE — Telephone Encounter (Signed)
Telephone  Encounter by Ree Shay at 02/02/22 1321                Author: Ree Shay  Service: --  Author Type: --       Filed: 02/02/22 1321  Encounter Date: 02/02/2022  Status: Signed          Editor: Ree Shay               ----- Message from [place] sent at 02/02/2022  7:38 AM EDT -----   Regarding: paperwork   Patient called in regards to psychological evaluation was received by email.

## 2022-02-14 NOTE — Telephone Encounter (Signed)
Patient called in re: she has been emailing back and forth with Benjie Karvonen and was told she was receive a call to schedule her psych evaluation. I confirmed with Irving Copas, [facility] and she has not received the assessment. Please contact patient.

## 2022-02-15 NOTE — Telephone Encounter (Signed)
She sent this through [place] and I forwarded it to [facility] in [place]. I also have it saved on my computer, will send Sasha the email and contact patient.

## 2022-02-23 ENCOUNTER — Ambulatory Visit: Admit: 2022-02-23 | Discharge: 2022-02-23 | Attending: Registered" | Primary: Family

## 2022-02-23 ENCOUNTER — Encounter: Attending: Registered" | Primary: Family

## 2022-02-23 NOTE — Progress Notes (Signed)
Tree surgeon at [facility]  Supervised Weight Loss     Date:   02/23/2022    Patient's Name: Teresa Allen  DOB: 05/04/1976    Insurance:  [employer]          Session: 5 of  6  Surgery: Gastric bypass                  Surgeon:   Dr. Truitt Leep     Height: 62 inches       Weight:    383     Lbs (Pt stated)                              BMI:  70             Pounds Lost since last month: 0               Pounds Gained since last month: 0     Starting Weight: 374 lbs                   Previous Month's Weight: 383 lbs  Overall Pounds Lost: 0        Overall Pounds Gained: 9 lbs     Other Pertinent Information: Today's appointment was completed in a virtual setting d/t COVID-19.  Surgeon recommending 37 lb weight loss before surgery.        Smoking Status:  None  Alcohol Intake: None    I have reviewed with pt the guidelines of the supervised wt loss program.  Pt understands the expectations of some wt loss during the program and that wt gain could delay the process. I have also explained that appointments need to be consecutive and missing an appointment may result in starting over. Pt has received this information in writing.          Changes that patient has made since last month include:  continuing to track food intake, reading food labels.      Eating Habits and Behaviors  A nutrition lesson was presented on label reading with specific guidelines provided for limiting added sugars. This information will help increase healthy food choices, promote weight loss and prevent dumping syndrome after gastric bypass. We also reviewed the general nutrition guidelines for bariatric surgery.                        Patient's current diet habits include: Pt is eating 3 meals per day (B: protein shake- Premier, Fairlife; L: ham, salads; chicken salad or deli meats/cheese/olives/veggies or sandwich; D: Spanish or tacos or spaghetti or chicken, salad). Snack choices include nuts, string cheese . Pt is eating  refined carbohydrate foods (bread, pasta, rice, potatoes) occasionally. Pt is eating sweets/desserts occasionally. Pt is using healthy/unhealthy cooking methods. Pt is eating meals prepared outside of the home 3x over past month. Pt is drinking water, coffee, herb tea. Pt reports little emotional eating.      Physical Activity/Exercise  We talked about the importance of increasing daily physical activity and beginning to develop an exercise regimen/routine. We talked about exercise as being an important part of long term weight loss after surgery.     Comments:  During class, I discussed with patient the importance of getting into an exercise routine.  Pt is currently walking 2x day for 10 min for activity.  Pt has been encouraged to increase physical activity-  obtain referral for physical therapy due to back issues.    Behavior Modification     We talked about how to eat more mindfully. Tips and recommendations for how to make these changes were provided. Pt was encouraged to keep a food journal and record what they were taking in daily.     Reviewed portion sizes; discuss drinking rule next appt.    Overall Assessment: Nutrition evaluation reveals small lifestyle changes are being made. Goals set and recommendations made. Will continue to assess    Patient-Set Goals:   1. Nutrition - switch to very low carb diet with lean protein  2. Exercise - research physical therapy- obtain referral  3. Behavior - continue to track food intake    Carmela Hurt, RD  02/23/2022

## 2022-02-24 ENCOUNTER — Telehealth: Admit: 2022-02-24 | Discharge: 2022-02-24 | Payer: PRIVATE HEALTH INSURANCE | Attending: Professional | Primary: Family

## 2022-02-24 DIAGNOSIS — F4321 Adjustment disorder with depressed mood: Secondary | ICD-10-CM

## 2022-02-24 NOTE — Progress Notes (Signed)
[facility]  Bariatric Psychosocial Evaluation - Part 1    This note will not be viewable in MyChart for the following reason(s). This is a Psychotherapy Note. [facility] Providers Only)    Date of Intake:  02/24/2022   Start Time:  9:00 AM  End Time:  1-:43 AM  CPT code: 35573  Telehealth:  Yes     Name: Teresa Allen      DOB:  July 14, 1976   Gender:  female   Marital Status:Divorced     Race/Ethnicity:  American [place] / [place] Native   Type of Service:  [phone] - Psychiatric Diagnostic Evaluation    [name] was evaluated through a patient-initiated, synchronous (real-time) audio-visual encounter.  Patient (or guardian if applicable) is aware that it is a billable service, which includes applicable co-pays, with coverage as determined by her insurance carrier. This visit was conducted with the patient's (and/or legan guardian's) verbal consent.  The patient was located in a state where the provider was licensed to provide care.  The patient was located at: Home: [address]  [place] [address]  The provider was located at: Home [place] State[place]     REASON FOR REFERRAL:    Teresa Allen is a 46 year-old, divorced, American [place] / [place] Native, gender referred by her surgeon, Dr. Truitt Leep, to determine her appropriateness for bariatric surgery.  She is 5 feet 2 inches tall and her total weight today is 382 lbs.  The purpose of the evaluation is to assess personality, psychopathology, emotional functioning, and health behavior adherence through clinical interview and psychological testing to identify factors that might provide challenges to optimal recovery for bariatric/metabolic surgery (BMS).    HISTORY OF PRESENTING PROBLEM:    Teresa Allen reported having struggled with her weight since the age of 74 (after she started having children).  The patient stated that her lowest adult weight was 150 lbs. and highest adult weight was 382 lbs.  Patient  endorsed the following contributors to her weight problems:  ?  Genetics    ?  Poor Eating Habits  ?  Inconsistency  ?  Sedentary Lifestyle ?  Injury or Illness   ?  Other: took steroids    Past and Current Maladaptive Eating Behaviors reported:  ?  Grazing/Frequent Snacking  ?  Night Eating  []   Binge Eating  Notes (i.e. Frequency, intensity, past history of, etc):    Ms. Lund states that she is not a breakfast eater.  She states that her biggest meal has always been dinner.  She states that she often would snack throughout the day with her children and in the past did not make meals and would primarily eat the most in the evening where she would cook.  She states that currently her son is [employer] school and he cooks dinner.  She states that since her son started cooking, she began having protein shakes for breakfast and a larger lunch now that she is working from home.  She states that lunch is now the biggest meal of the day.    Previous Attempts to Lose Weight include the following:  []   Structured Diets (e.g. Weight Watchers)     ?  At-home Diets (e.g. calorie counting, etc.)  []   Working with dietician or physician             ?  Exercise  []   Prescription Medications for weight loss    []   OTC  Medications for weight loss  []   Starving/Purging/Over-Exercising (or other unhealthy weight loss attempts)    Patient reported being unable to keep the weight off for the following reasons:  []   Too restrictive  ?  Frustrated with slow results []   Bored with plan  ?  Inconsistency ?  Injury or Illness   ?  Emotional Eating  ?  Other:  hormones    Patients are informed that some of the same barriers to prior weight loss may still exist following surgery.  Personalized recommendations are provided to overcome hurdles after surgery, and patients are encouraged to utilize a dietitian and/or psychologist/therapist if they notice that they are beginning to have difficulty adhering to the program following surgery.   Interested patients are given a recommended reading list and encouraged to use a blank journal to begin food journaling so that they can more accurately understand and evaluate patterns that have led to non-compliance.      Teresa Allen reportedly began seriously considering surgery for weight loss 4 years ago.  Teresa Allen reported that her weight has a great impact on her life and her reasons for seeking surgery include:    ?  Improving health    []   Decreasing future health risks ?  Maintaining weight loss  ?  Improving quality of life   []   Improved Appearance  ?  Other: balanced hormones    QUALITY OF LIFE:      Teresa Allen reported that challenges with her weight have significantly impacted her quality of life in the following areas:  ?  Physical Functioning  []   Level of Independence ?  Psychological Health   ?  Social Relationships []   Work/Environment []   Spirituality/Religion  []   Sex Life   ?  Other:  self-image    EATING AND HEALTH HABITS:    Teresa Allen stated that she currently eats 2-3 meals/day.  She describes her meals as moderate in size.  The patient reported that she is currently working on decreasing carbs, eating smaller meals, increasing water intake, eliminating caffeine, eating small bites and chewing carefully.  She has completed 5 of 6 medically-supervised diet visits with the surgical program's dietitian, Carmela HurtValerie Rakes, RD.  In preparing for surgery, Teresa Allen reported making the following changes in her health regimen:         Completed Progress Requires Follow-up    Eating 3 meals    ?  []   []   Limited Snacking   ?  []   []   Decreasing Carbohydrates  []   ?  []   Increasing Protein   ?  []   []   Smaller Meals    []   ?  []   Increasing Water Intake  []   ?  []   Exercise    []   []   ?  Eliminating Caffeine   []   ?  []   Eliminating Carbonation  ?  []   []   Meal Planning    []   []   ?  Not drinking with Meals  []   []   ?  Small Bites    []   ?  []   Chewing Carefully   []   ?  []     Teresa Allen  believes that of all the changes she is required to make, increasing her water intake will be the most difficult.  Patients are encouraged to begin making as many changes prior to the surgery as possible.  During session, time is spent discussing which changes they believe will be  the most difficult and why (e.g. smaller portions, types of food, exercise, etc.).  Specific suggestions for easing transition are offered dependent on the patients past and current struggles with weight (e.g., food journal, support groups, etc).       WEIGHT HISTORY:    How old were you when you first became concerned with your weight? 46 years-old  Most successful diet in the past?  [place] diet in 2021  Weight lost on the diet listed above?  60 lbs.  Patient stated she maintained her weight for the following time?  1 year  How much control over your eating do you feel you have?  Ms. Brougham states that she feels has control over her eating.  She states that she does not binge eat and is able to say no to food.  Reported Current weight:  382 lbs.  Goal weight post-surgery:  She states that she does not have an idea of what weight she would like to be.    PHYSICAL ACTIVITY (past and present):    Past physical activity for Ms. Zalenski have included playing basketball, soccer, scuba diving, and karate.    Current physical activity for Ms. Hata includes very limited activity (walking around the house and going to the store).  Daily activity level:  Ms. 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    SLEEP PATTERN:    Ms. Iannello reported that she usually goes to sleep around 10:30-11 pm and wakes up around 6:15 am.  It takes her about 60 minutes to fall asleep.      Stays asleep    ?  Yes    No  Feels Rested During the Day:  ?  Yes    No  Sleep Disorder     None ?  Sleep Apnea   Insomnia    MEDICAL:    Please refer to the patient's medical record for full medical background.  Pertinent medical history and current medications were reviewed with Ms. Yancey Flemings.  She denies currently taking or a history of taking any psychotropic medications.  She appears to listen, understand and adhere to medical advice.  Ability to read and understand verbal and written material related to the medical procedures appears to be within normal limits.      SUBSTANCE USE:    Ms. Bacchi was educated on the heightened risk of increased addiction potential post-surgery.  Patient was reminded that she is to wait at least 12 months post-surgery to consume alcohol.  Abstinence from nicotine and other drugs is strongly advised.     Alcohol Use:   Denied past and current use of alcohol  Tobacco Use:  Denied past and current use of tobacco  Drug Use:    Denied past and current use of illicit drugs    PSYCHIATRIC HISTORY AND CURRENT TREATMENT/PRESENTATION:    Mental Health Examination:      Within normal limits    Past/Historical Diagnosis/es:    Depression while going through   divorce in 2019;   Reports taking Zoloft during the divorce but is no longer taking any medication for depression.  Current Treatment:       None reported     Suicidal Ideation/Plan/Intent/Attempts:  Denied  Homicidal  Ideation/Plan/Intent/Attempts:    Denied  Psychiatric Hospitalizations:    Denied    PSYCHOLOGICAL ASSESSMENT:    Mental Status Exam, patient was dressed properly.  No abnormal psychomotor movements observed.  Intellectual functioning appeared to be intact.  Mood and affect were congruent.  Insight was adequate.  Judgment was adequate.  Speech was normal, clear.  Thought process was coherent.  Patient did not report suicidal or homicidal ideations, intent or plans.  Patient denied self-injury behaviors.   Patient denied alcohol and other substance abuse.  Patient was oriented to the four spheres: self, place, time and situation.  Patient response to intervention was appropriate. Patient progress was adequate.  Protective factor: self-determination.    In addition to the clinical interview, the following assessments were conducted, scored and interpreted by Karle Barr, LPC, NCC, CCTP:  Patient Health Questionnaire (PHQ-9); Generalized Anxiety Disorder 7 (GAD-7); PTSD Checklist (PCL-C); Adult ADHD Self Report Scale (ASRS); Adverse Childhood Experience Scale - Revised (ACE - Revised); Binge Eating Disorder Screener-7 (BEDS-7)); Alcohol Screening (AUDIT); and the Drug Abuse Screening Test (DAST-10).  Performances of these tests were as follows:    ACE-Revised (Measure of adverse childhood experiences):  2 - intermediate risk for toxic stress physiology  ADHD Scale (Measure of inattention and hyperactivity):  within normal limits  AUDIT (Measure of alcohol intake/use/abuse): within normal limits  BEDS-7 (Measure of binge eating/disordering eating behaviors):  within normal limits   DAST (Measure of drug use/abuse):  within normal limits  GAD-7 (Measure of anxiety): within normal limits  PCL-C PTSD Checklist (Measure of trauma symptoms): within normal limits  PHQ-9 (Measure of depression): Mild level of depression    CURRENT STRESSORS/SUPPORT/COPING STRATEGIES:    Ms. Doukas reported having adequate support for the surgery to include her sister & her husband, son (age 18 - in culinary school - likes to cook for the family), daughter (age 84), and her younger two children (daughter - 54 years old, and son - 42 years old).  The following were reported as current life stressors by Ms. Teters:  []   Significant Others   []   Work   ?  Current Health Issues/Weight  []   Family Issues  []   Finances   []   Other:    Ms. Springsteen reported that she copes with stressors in the following manner:  []   Emotional Eating   []    Journaling   ?  Talking with Friends/Family  []   Counseling  []   Exercise   ?  Other:  sleeps, time with friends,           goes out of town every 3 months    Ms. Alverson was provided with information about the possibility of psychological changes following surgery to include depression, anxiety and transfer of addiction.  Ms. Abrams was strongly encouraged to seek help if she notices changes in her mood or behaviors.  The patient was reminded that support groups are available following surgery and that she should continue to utilize them to assist in transition post-surgery.  Ms. Gauer was advised to contact a healthcare professional (her surgeon, primary care provider, or the undersigned clinician) should symptoms of depression or anxiety occur and become unmanageable.        PATIENT MOTIVATION AND KNOWLEDGE:    Motivation for surgery:  Ms. Mittelstadt states that she wants her body back.  She was active with her kids in the past and wants to be able to do this again and feel "normal" in her body.  Understanding of procedure:  Ms. Huntoon reports having an adequate understanding of the gastric bypass procedure.  She reports "I might need to reel in the googling."      ICD-10-CM    1. Adjustment disorder with depressed mood  F43.21       2. Morbid obesity (HCC)  E66.01            RECOMMENDATIONS:  TBD - this will be discussed at the follow-up appointment on 03/29/22.    __________________________________________________    Plans:  Patient will return in a few weeks for a follow-up appointment to discuss recommendations from the bariatric psychosocial evaluation.  __________________________________________________    Signature: Nestor Ramp, LPC, NCC, CCTP     Date: 02/24/2022

## 2022-03-22 ENCOUNTER — Ambulatory Visit: Payer: PRIVATE HEALTH INSURANCE | Attending: Registered" | Primary: Family

## 2022-03-22 ENCOUNTER — Ambulatory Visit: Admit: 2022-03-22 | Discharge: 2022-03-22 | Payer: PRIVATE HEALTH INSURANCE | Attending: Registered" | Primary: Family

## 2022-03-22 NOTE — Progress Notes (Signed)
Tree surgeon at [facility]  Supervised Weight Loss     Date:   03/22/2022    Patient's Name: Teresa Allen  DOB: [birth year]    Insurance:  [employer]HC          Session: 6 of  6  Surgery: Gastric bypass                  Surgeon:   Dr. Truitt Leep     Height: 62 inches       Weight:    376.8    Lbs (Pt stated)                              BMI:  69            Pounds Lost since last month: 6.2 lbs               Pounds Gained since last month: 0     Starting Weight: 374 lbs                   Previous Month's Weight: 383 lbs  Overall Pounds Lost: 0        Overall Pounds Gained: 2.8 lbs     Other Pertinent Information: Today's appointment was completed in a virtual setting d/t COVID-19.  Surgeon recommending 37 lb weight loss before surgery.        Smoking Status:  None  Alcohol Intake: None    I have reviewed with pt the guidelines of the supervised wt loss program.  Pt understands the expectations of some wt loss during the program and that wt gain could delay the process. I have also explained that classes need to be consecutive.  Missing a class may result in starting over. Pt has received this information in writing.          Changes that patient has made since last month include: continues to track food intake, drinking slowly .      Eating Habits and Behaviors  General healthy eating guidelines were discussed. A nutrition lesson specific to the importance of protein intake after surgery was provided. We discussed food sources of protein, protein supplements and multiple reasons as to why protein is important after bariatric surgery.  Pts were instructed to focus on including protein at every meal and practice eating protein first at the meal. Pts were encouraged to sample a protein shake for tolerance. Patients were also instructed to use the balanced plate method for help with portion control and general healthy eating prior to surgery. We discussed measuring meals to 1/2 cup total per meal  after surgery. Drinking only calorie-free, sugar-free and non-carbonated beverages. We discussed the importance of drinking 64 ounces of fluid per day to prevent dehydration post-operatively.                       Patient's current diet habits include: Pt is eating 3 meals per day (B: 10am- cottage cheese/blueberries/strawberries/hemp hearts; 1pm-L: ham, salads; chicken salad or deli meats/cheese/olives/veggies or sandwich; D: chicken, salad or protein shake if not hungry). Snack choices include nuts-portioned out, string cheese, sugar free chocolate. Pt is eating refined carbohydrate foods (bread, pasta, rice, potatoes) occasionally. Pt is eating sweets/desserts occasionally. Pt is using healthy cooking methods. Pt is drinking water, coffee, herb tea. Pt reports little emotional eating. <38g carbs per day, 1600 kcal/day - using Baritastic app.  Physical Activity/Exercise  We talked about the importance of increasing daily physical activity and beginning to develop an exercise regimen/routine. We talked about exercise as being an important part of long term weight loss after surgery.     Comments:  During class, I discussed with patient the importance of getting into an exercise routine.  Pt is currently walking 2x day for 10 min for activity.  Pt has been encouraged to increase physical activity- obtain referral for physical therapy due to back issues.    Behavior Modification       We talked about how to eat more mindfully. Tips and recommendations for how to make these changes were provided. Pt was encouraged to keep a food journal and record what they were taking in daily.       Overall Assessment: Pt demonstrates appropriate lifestyle changes evidenced by reported changes and mostly weight maintenance over the past 6 months.  Pt demonstrates understanding of nutrition guidelines for bariatric surgery. Appears to be an appropriate candidate at this time    Patient-Set Goals:   1. Nutrition - reduce  calories to 1200 kcal/day- continue to track- try Lose It app  2. Exercise - try adding in chair exercises  3. Behavior - obtain PT referral; practice drinking rule    Carmela Hurt, RD  03/22/2022

## 2022-03-29 ENCOUNTER — Telehealth: Admit: 2022-03-29 | Discharge: 2022-03-29 | Payer: PRIVATE HEALTH INSURANCE | Attending: Professional | Primary: Family

## 2022-03-29 ENCOUNTER — Ambulatory Visit: Payer: PRIVATE HEALTH INSURANCE | Attending: Professional | Primary: Family

## 2022-03-29 DIAGNOSIS — F4321 Adjustment disorder with depressed mood: Secondary | ICD-10-CM

## 2022-03-29 NOTE — Progress Notes (Signed)
[facility]  Psychosocial Evaluation Part 2 - Mental Health Progress Note    This note will not be viewable in MyChart for the following reason(s). This is a Psychotherapy Note. [facility] Providers Only)    Name: Xochitl Egle  DOB: 1976/03/01  MRN: [id number]  Date of Service: 03/29/2022  Location of Service: [place] for both provider and patient  Type of Service:  Individual Therapy (Virtual Visit)  Start Time:  3:31 PM   End Time:   4:05 PM  CPT code: 62130      Devora Tortorella was evaluated through a patient-initiated, synchronous (real-time) audio-visual encounter.  Patient (or guardian if applicable) is aware that it is a billable service, which includes applicable co-pays, with coverage as determined by her insurance carrier. This visit was conducted with the patient's (and/or legan guardian's) verbal consent.  The patient was located in a state where the provider was licensed to provide care.  The patient was located at: Home: [address]  [place] [address]  The provider was located at: [place]      ICD-10-CM    1. Adjustment disorder with depressed mood  F43.21       2. Morbid obesity (HCC)  E66.01            Reason for Visit:  Follow-up to Bariatric Evaluation - to determine appropriateness for bariatric surgery from a psychosocial perspective with requirements and/or recommendations.  Following the Bariatric Psychosocial Evaluation - Part 2, a formal copy of the Bariatric Psychosocial Evaluation will be scanned into Epic EHR.       Subjective (patient report):  Patient acknowledged taking medication and denied any side-effects.  Did not endorse any depression, anxiety, mania or psychotic symptoms.      Objective (factual account of what was observed, mental health status):  Mental Health Status: Patient was dressed properly. No abnormal psychomotor movements observed. Intellectual functioning appeared to be intact.  Mood and affect were  congruent.  Insight was adequate. Judgment was adequate. Speech was normal, clear. Thought process was coherent.  Patient did not report suicidal or homicidal ideations, intent or plans. Patient denied self-injury behaviors. Patient denied alcohol and other substance abuse. Patient was oriented to the four spheres: self, place, time and situation. Patient response to intervention was appropriate. Patient progress is adequate. Protective factor: self-determination and positivity.     Assessment:              Risk Assessment (SI, HI, DV, unsafe environment):  Denies any SI, HI or DV                          If Yes, identify safety plan:   N/A     Clinical Intervention:  This evaluator went over with the patient the approval for the bariatric evaluation and discussed a brief overview of post-surgery success guide (meal planning, exercise fatigue, planning for vacations/holidays/celebrations, social aspects of change after surgery, changes in motivation levels, plateaus, relationship changes, transfer addiction, understand depression, boundary setting, stress management, creating a mantra, and steps for success.       Treatment Plan  Goal #1:  Reduce Emotional Eating   Objective #1:  Learning to keep a food diary, learning stress management skills, developing healthy behaviors and identifying difference between physical and emotional hunger.     Plans:  Continue to use Cognitive Behavioral Therapy and Dialectical Behavioral Therapy  Continue to work with  patient on primary goal  Continue to see patient for counseling to address behavioral changes, if patient decides that they would like to seek supportive counseling    Bariatric Surgery Mental Health Recommendations/Requirements:  This evaluator reviewed with the client the following recommendations/requirements prior to surgery:       This evaluator discussed with the patient progress and commitment to making changes in patient's lifestyle to lose weight.  Tiffaney Heimann was told she was APPROVED for surgery from a psychosocial perspective.  This evaluator informed patient that she has the following recommendations:  Ongoing bariatric dietician consults to support necessary dietary changes  Ongoing bariatric surgery support groups  Behavioral health clinical services to address behavioral changes needed for successful recovery from surgery and maintenance of ideal weight     Patient verbalized understanding of the information presented.        Signature: Nestor Ramp, LPC, NCC, CCTP      Date:  03/29/2022

## 2022-04-04 NOTE — Telephone Encounter (Signed)
Pt want to know if we received a fax from [facility] Surgical Team?       Pt # [phone]

## 2022-04-05 NOTE — Telephone Encounter (Signed)
Pt to have form faxed to [phone]

## 2022-04-07 NOTE — Telephone Encounter (Signed)
Bariatric center @ [address] Teresa Allen was to have sent paper work to [place] - pt was wondering if we filled it out / sent it back - could you look into that for her - we have confirmed faxed # previously

## 2022-04-08 NOTE — Telephone Encounter (Signed)
Pt needs cardiac clearance for bariatric surgery. Last seen 11/2021. Dx Brady/Hypotension

## 2022-04-11 NOTE — Telephone Encounter (Signed)
Pt see in office 11/2021 for possible brady/afib. No concerns seen on testing. Needs clearance for bariatric surgery.

## 2022-04-11 NOTE — Telephone Encounter (Signed)
Pt is checking on the status of her bariactric surgery forms being sent over.Checking to see if we have received them or not.    Please assist.    [phone]

## 2022-04-13 NOTE — Telephone Encounter (Signed)
Lyn Henri routed conversation to [place] 2 days ago     Deeann Cree, Tejah "Henslee Lottman" [phone]  2 days ago     MM  s/w pt - sched clearance appt for 04/18/2022    Outgoing call

## 2022-04-13 NOTE — Telephone Encounter (Signed)
Pt will keep 04/18/22 to get current visit for surgery.

## 2022-04-18 ENCOUNTER — Ambulatory Visit: Admit: 2022-04-18 | Discharge: 2022-04-18 | Payer: PRIVATE HEALTH INSURANCE | Attending: Registered" | Primary: Family

## 2022-04-18 ENCOUNTER — Ambulatory Visit: Admit: 2022-04-18 | Discharge: 2022-04-18 | Payer: PRIVATE HEALTH INSURANCE | Attending: Specialist | Primary: Family

## 2022-04-18 DIAGNOSIS — R001 Bradycardia, unspecified: Secondary | ICD-10-CM

## 2022-04-18 NOTE — Progress Notes (Signed)
CARDIOLOGY OFFICE NOTE    Bertie Simien A. Brittley Regner, MD, [facility]    [address], [place], [place] [address]  Phone [phone]; Fax [phone]  Mobile [phone]   Voice Mail [phone]    Primary care: Luther Hearing, APRN - NP       ATTENTION:   This medical record was transcribed using an electronic medical records/speech recognition system.  Although proofread, it may and can contain electronic, spelling and other errors.  Corrections may be executed at a later time.  Please feel free to contact us for any clarifications as needed.             Teresa Allen is a 46 y.o. female with  referred for   bradycardia and hypotension            Cardiac risk factors: obesity, dyslipidemia   I have personally obtained the history from the patient.              HISTORY OF PRESENTING ILLNESS    Ms./Mr. Teresa Allen  46 y.o. is doing well since I last saw her.  She microplating shortness of breath today.  She has had some bradycardia in the past 50 days EKG is normal sinus.  BMI is currently 69.    She is here for cardiac preoperative risk stratification.  She is now wearing CPAP. No complaints of chest pain or SOB.  She did not get her cholesterol checked at the request so I did give her a lab requisition.     ACTIVE PROBLEM LIST     Patient Active Problem List    Diagnosis Date Noted    Elevated liver enzymes 09/03/2020    Pneumonia due to COVID-19 virus 09/03/2020    Acute respiratory failure with hypoxia (HCC) 09/03/2020    SIRS (systemic inflammatory response syndrome) (HCC) 09/03/2020    COVID-19 09/03/2020           PAST MEDICAL HISTORY     Past Medical History:   Diagnosis Date    Sleep apnea            PAST SURGICAL HISTORY     Past Surgical History:   Procedure Laterality Date    IR CHOLECYSTOSTOMY PERCUTANEOUS COMPLETE  2012    LUMBAR FUSION      removal of 20 % of disc    TONSILLECTOMY  1980's          ALLERGIES     No Known Allergies       FAMILY HISTORY     No  family history on file. negative for cardiac disease       SOCIAL HISTORY     Social History     Socioeconomic History    Marital status: Divorced     Spouse name: None    Number of children: None    Years of education: None    Highest education level: None   Tobacco Use    Smoking status: Never    Smokeless tobacco: Never   Substance and Sexual Activity    Alcohol use: Never    Drug use: Never         MEDICATIONS     Current Outpatient Medications   Medication Sig    TURMERIC PO Take by mouth as needed    rosuvastatin (CRESTOR) 10 MG tablet Take 1 tablet by mouth nightly    Biotin 2.5 MG CAPS Take by mouth (Patient not taking: Reported on 04/18/2022)  No current facility-administered medications for this visit.       I have reviewed the nurses notes, vitals, problem list, allergy list, medical history, family, social history and medications.       REVIEW OF SYMPTOMS    As per HPI  General: Pt denies excessive weight gain or loss. Pt is able to conduct ADL's  HEENT: Denies blurred vision, headaches, hearing loss, epistaxis and difficulty swallowing.  Respiratory: Denies cough, congestion, shortness of breath, DOE, wheezing or stridor.  Cardiovascular: Denies precordial pain, palpitations, edema or PND  Gastrointestinal: Denies poor appetite, indigestion, abdominal pain or blood in stool  Genitourinary: Denies hematuria, dysuria, increased urinary frequency  Musculoskeletal: Denies joint pain or swelling from muscles or joints  Neurologic: Denies tremor, paresthesias, headache, or sensory motor disturbance  Psychiatric: Denies confusion, insomnia, depression  Integumentray: Denies rash, itching or ulcers.  Hematologic: Denies easy bruising, bleeding     PHYSICAL EXAMINATION      Vitals:    04/18/22 1541   BP: 120/80   Site: Left Upper Arm   Position: Sitting   Cuff Size: Large Adult   Pulse: 78   SpO2: 98%   Weight: (!) 377 lb (171 kg)   Height: 5\' 2"  (1.575 m)     General: Well developed, in no acute distress.   Morbidly obese  HEENT: No jaundice,  Neck: Supple, no stiffness,  Heart:  Normal S1/S2 negative S3 or S4. Regular, no murmur, gallop or rub, no jugular venous distention  Respiratory: Clear bilaterally x 4, no wheezing or rales  Extremities:  No edema, normal cap refill, no cyanosis.  Musculoskeletal: No clubbing, no deformities  Neuro: A&Ox3, speech clear, gait stable, cooperative, no focal neurologic deficits  Skin: Skin color is normal. No rashes or lesions. Non diaphoretic, moist.        EKG: Date: (04/18/2022) sinus rhythm incomplete right bundle branch block     DIAGNOSTIC DATA     1. Echo  09/10/20- EF 55-60%    2. Loop  1/10-11/17/20- no critical or serious events and 8 stable events all of which were sinus rhythm on one occasion and states that it was atrial fibrillation but on one portion it seemed to march out appropriately and the last 3 beats were regular, average HR 84, min 52 and max 143    3. Lipids  10/23/20- TC 209, HDL 52, LDL 135, TG 122              LABORATORY DATA       Lab Results   Component Value Date/Time    WBC 5.7 11/25/2020 03:25 PM    HGB 13.9 11/25/2020 03:25 PM    HCT 43.9 11/25/2020 03:25 PM    PLT 343 11/25/2020 03:25 PM    MCV 95.2 11/25/2020 03:25 PM      Lab Results   Component Value Date/Time    NA 140 09/16/2020 12:53 AM    K 4.4 09/16/2020 12:53 AM    CL 106 09/16/2020 12:53 AM    CO2 27 09/16/2020 12:53 AM    BUN 16 09/16/2020 12:53 AM    GFRAA >60 09/16/2020 12:53 AM    GLOB 3.0 09/16/2020 12:53 AM    ALT 18 10/23/2020 02:43 PM          ICD-10-CM    1. Bradycardia, unspecified  R00.1 EKG 12 Lead      2. Tachycardia, unspecified  R00.0       3. Morbid (severe) obesity due to  excess calories (HCC)  E66.01               ASSESSMENT/RECOMMENDATIONS:.       1.  Bradycardia and hypotension   -event loop monitor demonstrated no critical or serious events and 8 stable events all of which were sinus rhythm on one occasion and states that it was atrial fibrillation but on one portion it  seemed to march out appropriately and the last 3 beats were  regular.   - EKG today demonstrates normal sinus rhythm she has no complaints of rhythm   2.  Morbid obesity  -BMI is 60 And should go forward with bariatric surgery and I have given them the information that she has a low cardiac operative risk   3.  History of Covid infection/shortness of breath   -Shortness of breath has been ongoing and probably was worse after COVID.   -Really no complaints of shortness of breath at this time   4. Apnea    -Sleep study done and is now on CPAP   5.  Screening cholesterol   -LDL is not at goal and is 841   -crestor 10 mg a day   -She did not have her cholesterol checked so I gave her a lab requisition to have this done    6.  Possible A. Fib   -2 episodes of irregularity and has used the alivcor and no atrial fibrillation  7.  Cardiac preoperative risk  -She is a low cardiac operative risk and may proceed with surgery.  No further testing is indicated   8.  Up with me in  24months or as needed    Orders Placed This Encounter   Procedures    EKG 12 Lead     Order Specific Question:   Reason for Exam?     Answer:   Hypotension       We discussed the expected course, resolution and complications of the diagnosis(es) in detail.  Medication risks, benefits, costs, interactions, and alternatives were discussed as indicated.  I advised him to contact the office if his condition worsens, changes or fails to improve as anticipated. He expressed understanding with the diagnosis(es) and plan        No follow-up provider specified.      I have discussed the diagnosis with  Claybon Jabs and the intended plan as seen in the above orders.  Questions were answered concerning future plans.  I have discussed medication side effects and warnings with the patient as well.    Thank you,  Luther Hearing, APRN - NP for involving me in the care of  Teresa Allen. Please do not hesitate to contact me for further  questions/concerns.         Alyce Inscore A.Amalie Koran,  MD, [facility]      [facility]  [facility]      [address] [place], [address]     [place], [place]  [address]      [phone] / [phone] Fax

## 2022-04-18 NOTE — Progress Notes (Signed)
Chief Complaint   Patient presents with    Hypotension    Cholesterol Problem    Bradycardia     Vitals:    04/18/22 1541   BP: 120/80   Site: Left Upper Arm   Position: Sitting   Cuff Size: Large Adult   Pulse: 78   SpO2: 98%   Weight: (!) 377 lb (171 kg)   Height: 5\' 2"  (1.575 m)      BP 120/80 (Site: Left Upper Arm, Position: Sitting, Cuff Size: Large Adult)   Pulse 78   Ht 5\' 2"  (1.575 m)   Wt (!) 377 lb (171 kg)   SpO2 98%   BMI 68.95 kg/m      Chest pain             NO  SOB                       NO  Swelling                 FEET  Dizziness               NO  Recent hospital     NO  Refills                    NO         Covid                      YES  NAME Teresa Allen     DOB    11/15/1975      MRN    Claybon Jabs      LAST OFFICE APPOINTMENT: Visit date not found     DIAGNOSIS    ICD-10-CM    1. Bradycardia, unspecified  R00.1 EKG 12 Lead      2. Tachycardia, unspecified  R00.0       3. Morbid (severe) obesity due to excess calories (HCC)  E66.01           HOME MEDICATION  Current Outpatient Medications   Medication Sig    TURMERIC PO Take by mouth as needed    rosuvastatin (CRESTOR) 10 MG tablet Take 1 tablet by mouth nightly    Biotin 2.5 MG CAPS Take by mouth (Patient not taking: Reported on 04/18/2022)     No current facility-administered medications for this visit.       VITAL SIGNS  Wt Readings from Last 3 Encounters:   04/18/22 (!) 377 lb (171 kg)   12/01/21 (!) 371 lb (168.3 kg)   08/19/21 (!) 374 lb 12.8 oz (170 kg)     BP Readings from Last 3 Encounters:   04/18/22 120/80   12/01/21 110/80   08/19/21 110/68     Pulse Readings from Last 3 Encounters:   04/18/22 78   08/19/21 72   06/02/21 76         SPECIALTY COMMENTS  1. Echo  09/10/20- EF 55-60%    2. Loop  1/10-11/17/20- no critical or serious events and 8 stable events all of which were sinus rhythm on one occasion and states that it was atrial fibrillation but on one portion it seemed to march out appropriately and the last 3 beats were  regular, average HR 84, min 52 and max 143    3. Lipids  10/23/20- TC 209, HDL 52, LDL 135, TG 122

## 2022-04-18 NOTE — Progress Notes (Signed)
Tree surgeon at [facility]  Supervised Weight Loss     Date:   04/18/2022    Patient's Name: Teresa Allen  DOB: Oct 30, 1975    Insurance:  [employer]          Session: 7 of  6  Surgery: Gastric bypass                  Surgeon:   Dr. Truitt Leep     Height: 62 inches       Weight:    380   Lbs (Pt stated)                              BMI:  69            Pounds Lost since last month: 0              Pounds Gained since last month: 3.2 lbs     Starting Weight: 374 lbs                   Previous Month's Weight: 376.8 lbs  Overall Pounds Lost: 0        Overall Pounds Gained: 6 lbs     Other Pertinent Information: Today's appointment was completed in a virtual setting d/t COVID-19.  Surgeon recommending 37 lb weight loss before surgery.        Smoking Status:  None  Alcohol Intake: None    I have reviewed with pt the guidelines of the supervised wt loss program.  Pt understands the expectations of some wt loss during the program and that wt gain could delay the process. I have also explained that appointments need to be consecutive and missing an appointment may result in starting over. Pt has received this information in writing.          Changes that patient has made since last month include:  doing chair exercises from YouTube, increased water intake, practiced drinking rule.      Eating Habits and Behaviors  A nutrition lesson specific to vitamins was provided. We discussed the various reasons for needing vitamins and different types and doses. General healthy eating guidelines were also discussed. Pts were instructed that their plate should be made up 1/2 plate coming from non-starchy vegetables, 1/4 coming from lean meat, and 1/4 of their plate coming from carbohydrates, including fruits, starches, or milk.  We discussed measuring meals to 1/2 cup total per meal after surgery. Drinking only calorie-free, sugar-free and non-carbonated beverages. We discussed the importance of drinking 64 ounces  of fluid per day to prevent dehydration post-operatively.                       Patient's current diet habits include:  Pt is eating 3 meals per day (B: 10am- cottage cheese/blueberries/strawberries/hemp hearts; 1pm-L: ham, salads; chicken salad or deli meats/cheese/olives/veggies or sandwich; D: chicken, salad or protein shake if not hungry). Snack choices include nuts-portioned out, string cheese, sugar free chocolate. Pt is eating refined carbohydrate foods (bread, pasta, rice, potatoes) occasionally. Pt is eating sweets/desserts occasionally. Pt is using healthy cooking methods. Pt is drinking water, coffee, herb tea. Pt reports little emotional eating. <38g carbs per day, 1400 kcal/day - using [employer] app.            Physical Activity/Exercise  We talked about the importance of increasing daily physical activity and beginning to develop  an exercise regimen/routine. We talked about exercise as being an important part of long term weight loss after surgery.     Comments:  During class, I discussed with patient the importance of getting into an exercise routine.  Pt is currently stretching/chair exercises for activity.  Pt has been encouraged to continue with current exercise; obtain PT referral.     Behavior Modification     We talked about how to eat more mindfully and identify emotional eating triggers. Tips and recommendations for how to make these changes were provided. Pt was encouraged to keep a food journal and record what they were taking in daily.       Overall Assessment:  Pt demonstrates appropriate lifestyle changes evidenced by reported changes and mostly weight maintenance over the past 7 months.  Pt demonstrates understanding of nutrition guidelines for bariatric surgery. Appears to be an appropriate candidate at this time       Patient-Set Goals:   1. Nutrition - continue with 3 meals a day  2. Exercise - continue with chair exercises; obtain PT referral  3. Behavior -continue with tracking  food; try liver shrinking diet    Carmela Hurt, RD  04/18/2022

## 2022-04-18 NOTE — Patient Instructions (Signed)
It is  my pleasure to have the opportunity to be involved in taking care of you and to provide you the best cardiac care.    At the end of every visit I  encourage you to eat healthy and clean and reduce your red meat intake as well as exercise 30 minutes 5 days a week to ensure a healthy heart.  If you are a smoker , it will be essential that you stop smoking to reduce your risk of heart disease.      Part of providing you the best heart care possible IS AN ACCURATE KNOWLEDGE OF YOUR MEDICINE. It  will be  essential at each office visit that you provide me with an accurate list of your medicines.  When you come into the office you should have that list or another option is lining up your pill bottles  and taking a snapshot with your cell phone of all the medicines you are taking currently and show it to the nurse in the examining room.    Inaccurate reporting of your medication to the nurse may lead to adverse events and medical errors.      Thank you again for giving me the opportunity to be part of your heart care and it is my pleasure.    All the best,    Tatsuya Okray A. Aryav Wimberly, MD

## 2022-04-20 NOTE — Telephone Encounter (Signed)
Cardiac clearance noted. Surgeon aware of clearance note in connect care.

## 2022-04-20 NOTE — Telephone Encounter (Signed)
Teresa Allen called in regards to cardiac clearance being in the system.

## 2022-04-20 NOTE — Telephone Encounter (Signed)
Teresa Allen's called and stated they fax over paperwork PCP support form, and need it fill out,please advise    Carlena Sax [phone] fax [phone]

## 2022-04-21 NOTE — Telephone Encounter (Signed)
Informed office that clearance is in her last office note visit.

## 2022-05-01 LAB — HEPATIC FUNCTION PANEL
ALT: 15 IU/L (ref 0–32)
AST: 13 IU/L (ref 0–40)
Albumin: 3.9 g/dL (ref 3.9–4.9)
Alkaline Phosphatase: 103 IU/L (ref 44–121)
Bilirubin, Direct: 0.13 mg/dL (ref 0.00–0.40)
Total Bilirubin: 0.4 mg/dL (ref 0.0–1.2)
Total Protein: 6.5 g/dL (ref 6.0–8.5)

## 2022-05-01 LAB — LIPID PANEL
Cholesterol: 108 mg/dL (ref 100–199)
HDL: 45 mg/dL (ref >39)
LDL Calculated: 48 mg/dL (ref 0–99)
Triglycerides: 70 mg/dL (ref 0–149)
VLDL Cholesterol Calculated: 15 mg/dL (ref 5–40)

## 2022-05-02 NOTE — Other (Signed)
Lipids are at goal. No action needed. I would like the cholesterol checked again in 6 mo.Continue to eat healthy and clean.

## 2022-05-04 NOTE — Telephone Encounter (Signed)
Called pts cardiologist office (Dr. Lorella Nimrod) and spoke with his nurse about the PCP form that patient needed to have completed for her insurance requirements for bariatric surgery. She does not have a PCP currently.     Dr. Jeneen Montgomery nurse stated that he was not going to fill out the form because he has clearance for her in his note. I asked her if he would write a letter of support for clearance if he won't fill out the document. She responded with "he is not going to repeat himself when the clearance is on his notes"     I told his nurse that I would call the patient and let her know that Dr. Lorella Nimrod is not going to complete the form.

## 2022-05-06 ENCOUNTER — Encounter

## 2022-05-06 NOTE — Other (Signed)
Mailed to pt

## 2022-05-11 ENCOUNTER — Ambulatory Visit: Admit: 2022-05-11 | Discharge: 2022-05-11 | Payer: PRIVATE HEALTH INSURANCE | Attending: Surgery | Primary: Family

## 2022-05-11 NOTE — Progress Notes (Signed)
Identified patient with two patient identifiers (name and DOB). Reviewed chart in preparation for visit and have obtained necessary documentation.    Teresa Allen is a 46 y.o. female  Chief Complaint   Patient presents with    Follow-up     Morbid Obesity - Final review before submission.     BP 105/69 (Site: Left Upper Arm, Position: Sitting, Cuff Size: Medium Adult)   Pulse 82   Temp 97.9 F (36.6 C) (Oral)   Resp 20   Ht 5\' 2"  (1.575 m)   Wt (!) 379 lb (171.9 kg)   SpO2 95%   BMI 69.32 kg/m     1. Have you been to the ER, urgent care clinic since your last visit?  Hospitalized since your last visit?no    2. Have you seen or consulted any other health care providers outside of the [facility] since your last visit?  Include any pap smears or colon screening. no

## 2022-05-11 NOTE — Progress Notes (Signed)
Bariatric Surgery Progress Note    CC: Obesity  Subjective:     46 year old female being worked up for weight loss surgery.  She saw our NP last fall.  She has completed her dietary and psych visits.  Upper GI completed.  Difficulty losing weight.  Reports diarrhea 3-4 times a day after eating secondary to gallbladder coming out.    Total weight loss to date: About 7 pounds gained during the process    Tobacco use: no    EGD / UGI reviewed / issues: ugi small hiatal hernia    Sleep apnea addressed: CPAP    Previous relevant surgeries: Laparoscopic cholecystectomy    Patient Active Problem List    Diagnosis Date Noted    Elevated liver enzymes 09/03/2020    Pneumonia due to COVID-19 virus 09/03/2020    Acute respiratory failure with hypoxia (HCC) 09/03/2020    SIRS (systemic inflammatory response syndrome) (HCC) 09/03/2020    COVID-19 09/03/2020      Past Medical History:   Diagnosis Date    Sleep apnea      Past Surgical History:   Procedure Laterality Date    IR CHOLECYSTOSTOMY PERCUTANEOUS COMPLETE  2012    LUMBAR FUSION      removal of 20 % of disc    TONSILLECTOMY  1980's      Social History     Tobacco Use    Smoking status: Never    Smokeless tobacco: Never   Substance Use Topics    Alcohol use: Never      History reviewed. No pertinent family history.   Prior to Admission medications    Medication Sig Start Date End Date Taking? Authorizing Provider   rosuvastatin (CRESTOR) 10 MG tablet Take 1 tablet by mouth nightly 12/01/21  Yes Ar Automatic Reconciliation   TURMERIC PO Take by mouth as needed  Patient not taking: Reported on 05/11/2022    Ar Automatic Reconciliation     Allergies   Allergen Reactions    Morphine Headaches        Review of Systems:    Constitutional: No fever or chills  Neurologic: No headache  Eyes: No scleral icterus or irritated eyes  Nose: No nasal pain or drainage  Mouth: No oral lesions or sore throat  Cardiac: No palpations or chest pain  Pulmonary: No cough or shortness of  breath  Gastrointestinal: No nausea, emesis, diarrhea, or constipation  Genitourinary: No dysuria  Musculoskeletal: No muscle or joint tenderness  Skin: No rashes or lesions  Psychiatric: No anxiety or depressed mood      Objective:     Physical Exam:  Vitals:    05/11/22 1533   BP: 105/69   Pulse: 82   Resp: 20   Temp: 97.9 F (36.6 C)   SpO2: 95%     Body mass index is 69.32 kg/m.  Wt 379 lbs    General: No acute distress, conversant  Eyes: PERRLA, no scleral icterus  HENT: Normocephalic without oral lesions  Neck: Trachea midline without LAD  Cardiac: Normal pulse rate and rhythm  Pulmonary: Symmetric chest rise with normal effort  GI: Soft, obese, NT, ND, no hernia, no splenomegaly  Skin: Warm without rash  Extremities: No edema or joint stiffness  Psych: Appropriate mood and affect    Assessment:     Morbid obesity with comorbidities  Plan:     Work-up reviewed.  Inadequate weight loss so far.  We will start the reduction diet.  Goal of  10 pounds of weight loss over the next month.  I will have her see our dietitians again and discussed the SADI and switch nutrition.  She is currently between bypass, SADI, and switch.  I have concerns that even the bypass may not be strong enough.  I need more weight loss regardless to get into the OR.  I may have to do a two-stage procedure.  I do not think that any of the procedures will worsen her diarrhea.  I will see her back in 1 month to assess her progress and to solidify a procedure choice for her.    Signed By: Darliss Cheney, MD, FACS, [facility]  Bariatric and General Surgeon  [facility]    May 11, 2022

## 2022-05-17 ENCOUNTER — Ambulatory Visit: Admit: 2022-05-17 | Discharge: 2022-05-17 | Payer: PRIVATE HEALTH INSURANCE | Attending: Registered" | Primary: Family

## 2022-05-17 NOTE — Progress Notes (Signed)
Tree surgeon at [facility]  Supervised Weight Loss     Date:   05/17/2022    Patient's Name: Teresa Allen  DOB: November 18, 1975    Insurance:  [employer]          Session: 8 of  6  Surgery: SADI                  Surgeon:   Dr. Truitt Leep     Height: 62 inches       Weight:  374  Lbs (Pt stated)                              BMI:  68            Pounds Lost since last month: 6 lbs              Pounds Gained since last month: 0     Starting Weight: 374 lbs                   Previous Month's Weight: 380 lbs  Overall Pounds Lost: 0        Overall Pounds Gained: 0     Other Pertinent Information: Today's appointment was completed in a virtual setting d/t COVID-19.  Surgeon recommending 37 lb weight loss before surgery. Pt interested in having SADI surgery- will review nutrition info during appt.        Smoking Status:  None  Alcohol Intake: None    I have reviewed with pt the guidelines of the supervised wt loss program.  Pt understands the expectations of some wt loss during the program and that wt gain could delay the process. I have also explained that appointments need to be consecutive and missing an appointment may result in starting over. Pt has received this information in writing.          Changes that patient has made since last month include: following liver shrinking diet, increased fluids.      Eating Habits and Behaviors  General healthy eating guidelines were also discussed. Pts were instructed that their plate should be made up 1/2 plate coming from non-starchy vegetables, 1/4 coming from lean meat, and 1/4 of their plate coming from carbohydrates, including fruits, starches, or milk.  We discussed measuring meals to 1/2 cup total per meal after surgery. Drinking only calorie-free, sugar-free and non-carbonated beverages. We discussed the importance of drinking 64 ounces of fluid per day to prevent dehydration post-operatively.                 Physical Activity/Exercise  We talked about  the importance of increasing daily physical activity and beginning to develop an exercise regimen/routine. We talked about exercise as being an important part of long term weight loss after surgery.     Comments:  During class, I discussed with patient the importance of getting into an exercise routine.  Pt is currently doing chair exercises for activity.        Behavior Modification    A behavior modification lesson was provided with an emphasis on developing mindful eating behaviors. We talked about how to eat more mindfully and identify emotional eating triggers. Tips and recommendations for how to make these changes were provided. Pt was encouraged to keep a food journal and record what they were taking in daily.        Overall Assessment: Pt previously demonstrated appropriate lifestyle  changes evidenced by reported changes and mostly weight maintenance over the past 8 months. Pt has now decided to have SADI surgery. Will need to pass SADI quiz.      Patient-Set Goals:   1. Nutrition - continue with liver shrinking diet- add in approved snacks  2. Exercise - continue with chair exercises  3. Behavior - take SADI quiz and return    Carmela Hurt, RD  05/17/2022

## 2022-05-26 ENCOUNTER — Encounter: Payer: PRIVATE HEALTH INSURANCE | Attending: Specialist | Primary: Family

## 2022-06-08 ENCOUNTER — Encounter: Attending: Specialist | Primary: Family

## 2022-06-10 ENCOUNTER — Ambulatory Visit: Admit: 2022-06-10 | Discharge: 2022-06-10 | Payer: PRIVATE HEALTH INSURANCE | Attending: Surgery | Primary: Family

## 2022-06-10 NOTE — Progress Notes (Signed)
Identified patient with two patient identifiers (name and DOB). Reviewed chart in preparation for visit and have obtained necessary documentation.    Teresa Allen is a 46 y.o. female  Chief Complaint   Patient presents with    Follow-up     4 week follow up      BP 107/80 (Site: Right Lower Arm, Position: Sitting, Cuff Size: Medium Adult)   Pulse 80   Temp 97.8 F (36.6 C) (Oral)   Resp 18   Ht 5\' 2"  (1.575 m)   Wt (!) 366 lb (166 kg)   SpO2 96%   BMI 66.94 kg/m     1. Have you been to the ER, urgent care clinic since your last visit?  Hospitalized since your last visit?no    2. Have you seen or consulted any other health care providers outside of the [facility] since your last visit?  Include any pap smears or colon screening. no

## 2022-06-10 NOTE — Progress Notes (Signed)
Submitted clinicals to [facility] via website for robotic single anastomosis duodenal ileostomy, robotic sleeve gastrectomy pre auth for Dr Katrinka Blazing.      Pending Berkley Harvey [id number]

## 2022-06-10 NOTE — Progress Notes (Signed)
Surgery Progress Note    06/10/2022    CC: Obesity    Subjective:     Patient has lost 13 pounds in the last month.  She has seen the dietitian and has talked about the SADI procedure.  She is now confident that she wants to proceed with this.  She is on the liver reduction diet.  Feeling hungry with lack of energy sometimes.    Constitutional: No fever or chills  Neurologic: No headache  Eyes: No scleral icterus or irritated eyes  Nose: No nasal pain or drainage  Mouth: No oral lesions or sore throat  Cardiac: No palpations or chest pain  Pulmonary: No cough or shortness of breath  Gastrointestinal: No nausea, emesis, diarrhea, or constipation  Genitourinary: No dysuria  Musculoskeletal: No muscle or joint tenderness  Skin: No rashes or lesions  Psychiatric: No anxiety or depressed mood    Objective:     Vitals:    06/10/22 1052   BP: 107/80   Pulse: 80   Resp: 18   Temp: 97.8 F (36.6 C)   SpO2: 96%     Body mass index is 66.94 kg/m.  Wt 366 lbs    General: No acute distress, conversant  Eyes: PERRLA, no scleral icterus  HENT: Normocephalic without oral lesions  Neck: Trachea midline without LAD  Cardiac: Normal pulse rate and rhythm  Pulmonary: Symmetric chest rise with normal effort  GI: Soft, obese, NT, ND, no hernia, no splenomegaly  Skin: Warm without rash  Extremities: No edema or joint stiffness  Psych: Appropriate mood and affect    Assessment:     46 year old female with morbid obesity who has successfully lost preoperative weight    Plan:     Okay to submit for robotic SADI procedure.  Once again discussed the risk of bleeding, infection, leak.  We discussed the postoperative application and the need for long-term vitamin supplementation.  She will undergo the SADI quiz with dietary later on this month.  She will undergo the preoperative class.  I will see her back 1 week prior to surgery. Cont liver reduction diet until surgery      Darliss Cheney, MD, FACS, [facility]  Bariatric and General Surgeon  [facility]

## 2022-06-20 NOTE — Telephone Encounter (Signed)
Spoke to patient,  I offered 07/28/22 for surgery and she accepted.  I let her know that I will be scheduling her per op appointments which are Diet and infor class, PAT and H&P.  That is any of these appointments are a no show or rescheduled it could push her surgery out.  She understood.  I also let her know that I will schedule her post op appointment and that once everything is scheduled I will put in all in a letter with dates, times and if they are virtual or in person.  This letter will post to [facility] and I will mail it out.  I will also call to let you know when it has been posted.  I told her should should hear from me by end of day today or tomorrow.  She acknowledged ok and thank you

## 2022-06-20 NOTE — Other (Signed)
Pat appt 07/01/22 @ 9:30 , requested by fax, confirmation sent

## 2022-06-20 NOTE — Telephone Encounter (Signed)
Spoke to patient to let her know her surgery letter has posted to her my chart and will go out in the mail. I reviewed the diet and info class that that won't show up in her my chart.  I informed her to be on the look out for an e-mail from Hilarie in her and to check junk/spam folder.  I explained what will be in the e-mail and to please reply to it so we know you received it.  She acknowledged ok and thank you

## 2022-06-23 NOTE — Telephone Encounter (Signed)
Spoke to patient to see if she received email that I sent to her yesterday for diet and info class.      She went and checked her email while I was on the phone and confirmed she had it.

## 2022-06-27 NOTE — Telephone Encounter (Signed)
Mateo Flow reached out to me in regards to the SADI quiz hasn't been received yet.  That she needs this to clear patient for surgery.      I spoke to the patient She apologizes  she said for some reason she over looked it.  She has it and will get it to you by end of day.      I let Mateo Flow know

## 2022-06-28 NOTE — Telephone Encounter (Signed)
Mateo Flow reached out to me to let me know she still hasn't received the [facility] quiz from patient.    Spoke to patient and she is resending it due to the one she sent last night wasn't received.    I verified Valerie's e-mail with her and she resent it

## 2022-07-01 ENCOUNTER — Inpatient Hospital Stay: Admit: 2022-07-01 | Payer: PRIVATE HEALTH INSURANCE | Primary: Family

## 2022-07-01 LAB — CBC WITH AUTO DIFFERENTIAL
Absolute Immature Granulocyte: 0 10*3/uL (ref 0.00–0.04)
Basophils %: 0 % (ref 0–1)
Basophils Absolute: 0 10*3/uL (ref 0.0–0.1)
Eosinophils %: 1 % (ref 0–7)
Eosinophils Absolute: 0.1 10*3/uL (ref 0.0–0.4)
Hematocrit: 41.2 % (ref 35.0–47.0)
Hemoglobin: 13.6 g/dL (ref 11.5–16.0)
Immature Granulocytes: 0 % (ref 0.0–0.5)
Lymphocytes %: 27 % (ref 12–49)
Lymphocytes Absolute: 1.5 10*3/uL (ref 0.8–3.5)
MCH: 29.8 PG (ref 26.0–34.0)
MCHC: 33 g/dL (ref 30.0–36.5)
MCV: 90.2 FL (ref 80.0–99.0)
MPV: 9.7 FL (ref 8.9–12.9)
Monocytes %: 5 % (ref 5–13)
Monocytes Absolute: 0.3 10*3/uL (ref 0.0–1.0)
Neutrophils %: 67 % (ref 32–75)
Neutrophils Absolute: 3.7 10*3/uL (ref 1.8–8.0)
Nucleated RBCs: 0 PER 100 WBC
Platelets: 314 10*3/uL (ref 150–400)
RBC: 4.57 M/uL (ref 3.80–5.20)
RDW: 12.8 % (ref 11.5–14.5)
WBC: 5.5 10*3/uL (ref 3.6–11.0)
nRBC: 0 10*3/uL (ref 0.00–0.01)

## 2022-07-01 LAB — COMPREHENSIVE METABOLIC PANEL
ALT: 24 U/L (ref 12–78)
AST: 10 U/L — ABNORMAL LOW (ref 15–37)
Albumin/Globulin Ratio: 0.9 — ABNORMAL LOW (ref 1.1–2.2)
Albumin: 3.2 g/dL — ABNORMAL LOW (ref 3.5–5.0)
Alk Phosphatase: 103 U/L (ref 45–117)
Anion Gap: 1 mmol/L — ABNORMAL LOW (ref 5–15)
BUN: 15 MG/DL (ref 6–20)
Bun/Cre Ratio: 23 — ABNORMAL HIGH (ref 12–20)
CO2: 29 mmol/L (ref 21–32)
Calcium: 8.8 MG/DL (ref 8.5–10.1)
Chloride: 111 mmol/L — ABNORMAL HIGH (ref 97–108)
Creatinine: 0.64 MG/DL (ref 0.55–1.02)
Est, Glom Filt Rate: >60 mL/min/{1.73_m2} (ref >60)
Globulin: 3.4 g/dL (ref 2.0–4.0)
Glucose: 98 mg/dL (ref 65–100)
Potassium: 4.4 mmol/L (ref 3.5–5.1)
Sodium: 141 mmol/L (ref 136–145)
Total Bilirubin: 0.3 MG/DL (ref 0.2–1.0)
Total Protein: 6.6 g/dL (ref 6.4–8.2)

## 2022-07-01 LAB — VITAMIN B12: Vitamin B-12: 542 pg/mL (ref 193–986)

## 2022-07-01 LAB — IRON AND TIBC
Iron % Saturation: 21 % (ref 20–50)
Iron: 72 ug/dL (ref 35–150)
TIBC: 338 ug/dL (ref 250–450)

## 2022-07-01 LAB — FOLATE: Folate: 10.7 ng/mL (ref 5.0–21.0)

## 2022-07-01 LAB — HEMOGLOBIN A1C
Hemoglobin A1C: 5.2 % (ref 4.0–5.6)
eAG: 103 mg/dL

## 2022-07-01 LAB — TSH: TSH, 3RD GENERATION: 2.17 u[IU]/mL (ref 0.36–3.74)

## 2022-07-01 NOTE — Other (Incomplete)
.  smhpat

## 2022-07-01 NOTE — Other (Signed)
Preoperative instructions reviewed with patient.  Patient given SSI infection FAQS sheet.  Given two bottles of skin prep chlorhexidine soap; given written and verbal instructions on use. Patient was given the opportunity to ask questions on the information provided. Patient instructed to [facility] upon leaving PAT department.

## 2022-07-01 NOTE — Other (Signed)
[facility]  BARIATRIC PREOPERATIVE INSTRUCTIONS    Surgery Date:   07/28/22    Your surgeon's office or [facility] staff will call you between 4 PM- 8 PM the day before surgery with your arrival time. If your surgery is on a Monday, you will receive a call the preceding Friday. If your surgeon's office has given your, your arrival time, then go by that time.    Please report to [facility] Patient Access/Admitting on the 1st floor.  Bring your insurance card, photo identification, and any copayment ( if applicable).   If you are going home the same day of your surgery, you must have a responsible adult to drive you home. You need to have a responsible adult to stay with you the first 24 hours after surgery and you should not drive a car for 24 hours following your surgery.  If you are being admitted to the hospital, please leave personal belongings/luggage in your car until you have an assigned hospital room number.  Absolutely NO alcohol of any kind 2 weeks before surgery and continue to avoid after surgery. Alcohol is not safe before or after surgery. Please discuss with your bariatric provider before resuming alcohol use. You must be 100% smoke free (tobacco and marijuana) for at least 3 months prior to surgery. Surgery will be cancelled if you are smoking.  Please wear comfortable clothes. Wear your glasses instead of contacts. We ask that all money, jewelry and valuables be left at home. Wear no make up, particularly mascara, the day of surgery.    All body piercings, rings, and jewelry need to be removed and left at home. Remove all nail polish except for clear. Please wear your hair loose or down, no pony-tails, buns, or any metal hair accessories. You may wear deodorant.  Do not shave any body area within 24 hours of your surgery.  Please follow all instructions to avoid any potential surgical cancellation.  Should your physical condition change, (i.e. fever, cold, flu, etc.) please notify your  surgeon as soon as possible.  It is important to be on time. If a situation occurs where you may be delayed, please call:  [phone] / [phone] on the day of surgery.  The Preadmission Testing staff can be reached at [phone].  Special instructions: BRING YOUR CPAP WITH YOU TO THE HOSPITAL      Eating and Drinking Before Bariatric Surgery        Continue your liver shrinking diet until the night before surgery. You may eat your liver shrinking diet at the usual time on the day before your surgery.  Do NOT eat any solid foods after midnight.  You may drink clear liquids only from 12 midnight until 1 hours prior to your arrival time at the hospital on the day of your surgery. Do NOT drink alcohol.  Clear liquids include:  Water  Flavored, sugar-free water  Crystal Light, Mio or sugar free generic  Sugar-free Kool-Aid or generic  Decaffeinated tea or coffee  Vitamin Water Zero  Powerade Zero  Gatorade Zero  Clear Protein drinks  Diet V-8 Splash  Diet Snapple  Diet Lemonade  You may drink up to 12 ounces at one time every 4 hours between the hours of midnight and 1 hour before your arrival time at the hospital. Example- if your arrival time at the hospital is 6am, you may drink 12 ounces of clear liquids no later than 5am.  If you have any  questions, please contact your surgeon's office.     Current Outpatient Medications   Medication Sig    Multiple Vitamins-Minerals (MULTIVITAL PO) Take by mouth    calcium carb-cholecalciferol (CALCIUM 600+D) 600-10 MG-MCG TABS per tab Take 1 tablet by mouth daily    rosuvastatin (CRESTOR) 10 MG tablet Take 1 tablet by mouth nightly     No current facility-administered medications for this encounter.       YOU MUST ONLY TAKE THESE MEDICATIONS THE MORNING OF SURGERY: NONE  MEDICATIONS TO TAKE THE MORNING OF SURGERY ONLY IF NEEDED: NONE  HOLD these prescription medications BEFORE Surgery: NONE  Ask your surgeon/prescribing physician about when/if to STOP taking these  medications: NONE.  (If you are currently taking Plavix, Coumadin, or any other blood-thinning/anticoagulant medication contact your prescribing physician for instructions).  Stop all vitamins, herbal medicines, Aspirin containing products and any non-steroidal anti-inflammatory drugs (i.e. Ibuprofen, Naproxen, Advil, Aleve) 7 days prior to surgery. You may take Tylenol.        Preventing Infections Before and After - Your Surgery    IMPORTANT INSTRUCTIONS     You play an important role in your health and preparation for surgery. To reduce the germs on your skin you will need to shower with CHG soap (Chorhexidine gluconate 4%) two times before surgery.    CHG soap (Hibiclens, Hex-A-Clens or store brand)  CHG soap will be provided at your Preadmission Testing (PAT) appointment.  If you do not have a PAT appointment before surgery, you may arrange to pick up CHG soap from our office or purchase CHG soap at a pharmacy, grocery or department store.  You need to purchase TWO 4 ounce bottles to use for your 2 showers.    Steps to follow:  Wash your hair with your normal shampoo and your body with regular soap and rinse well to remove shampoo and soap from your skin.  Wet a clean washcloth and turn off the shower.  Put CHG soap on washcloth and apply to your entire body from the neck down. Do not use on your head, face or private parts(genitals). Do not use CHG soap on open sores, wounds or areas of skin irritation.  Wash you body gently for 5 minutes. Do not wash your skin too hard. This soap does not create lather. Pay special attention to your underarms and from your belly button to your feet.  Turn the shower back on and rinse well to get CHG soap off your body.  Pat your skin dry with a clean, dry towel. Do not apply lotions or moisturizer.  Put on clean clothes and sleep on fresh bed sheets and do not allow pets to sleep with you.    Shower with CHG soap 2 times before your surgery  The evening before your  surgery  The morning of your surgery      Tips to help prevent infections after your surgery:  Protect your surgical wound from germs:  Hand washing is the most important thing you and your caregivers can do to prevent infections.  Keep your bandage clean and dry!  Do not touch your surgical wound.  Use clean, freshly washed towels and washcloths every time you shower; do not share bath linens with others.  Until your surgical wound is healed, wear clothing and sleep on bed linens each day that are clean and freshly washed.  Do not allow pets to sleep in your bed with you or touch your surgical wound.  Do  not smoke - smoking delays wound healing. This may be a good time to stop smoking.  If you have diabetes, it is important for you to manage your blood sugar levels properly before your surgery as well as after your surgery. Poorly managed blood sugar levels slow down wound healing and prevent you from healing completely.        Day of Procedure    Please park in the parking deck or any designated visitor parking lot.  Enter the facility through the Main Entrance of the hospital.  On the day of surgery, please provide the cell phone number of the person who will be waiting for you to the Patient Access representative at the time of registration.  Masks are highly recommended in the hospital, but not required.  Once your procedure and the immediate recovery period is completed, a nurse in the recovery area will contact your designated visitor to inform them of your room number or to otherwise review other pertinent information regarding your care.    Social distancing practices are strongly encouraged in waiting areas and the cafeteria.            The patient was contacted in person.   She verbalized understanding of all instructions and does not need reinforcement.

## 2022-07-01 NOTE — Other (Addendum)
CC MESSAGE SENT TO DR. Thompson Caul NURSE:    Teresa Allen,    Please note pt's Vitamin D was 21.6 in PAT on 07/01/22. Please notify Dr Tamala Julian. Thank you!    ABNORMAL LAB RESULT ALSO FAXED TO PCP VIA CC FAX.

## 2022-07-02 LAB — VITAMIN D 1,25 DIHYDROXY: Vit D, 1,25-Dihydroxy: 21.6 pg/mL — ABNORMAL LOW (ref 24.8–81.5)

## 2022-07-06 ENCOUNTER — Telehealth
Admit: 2022-07-06 | Discharge: 2022-07-06 | Payer: PRIVATE HEALTH INSURANCE | Attending: Nurse Practitioner | Primary: Family

## 2022-07-06 LAB — VITAMIN B1, WHOLE BLOOD: Vitamin B1,Whole Blood: 134 nmol/L (ref 66.5–200.0)

## 2022-07-06 MED ORDER — HYOSCYAMINE SULFATE SL 0.125 MG SL SUBL
0.125 | Freq: Four times a day (QID) | SUBLINGUAL | 0 refills | Status: AC | PRN
Start: 2022-07-06 — End: ?

## 2022-07-06 MED ORDER — GABAPENTIN 100 MG PO CAPS
100 MG | ORAL_CAPSULE | Freq: Three times a day (TID) | ORAL | 0 refills | Status: AC
Start: 2022-07-06 — End: 2022-07-11

## 2022-07-06 MED ORDER — OMEPRAZOLE 40 MG PO CPDR
40 MG | ORAL_CAPSULE | Freq: Every day | ORAL | 1 refills | Status: AC
Start: 2022-07-06 — End: ?

## 2022-07-06 MED ORDER — ENOXAPARIN SODIUM 40 MG/0.4ML IJ SOSY
40 MG/0.4ML | Freq: Every day | INTRAMUSCULAR | 0 refills | Status: AC
Start: 2022-07-06 — End: 2022-08-11

## 2022-07-06 MED ORDER — POLYETHYLENE GLYCOL 3350 17 GM/SCOOP PO POWD
17 GM/SCOOP | Freq: Every day | ORAL | 0 refills | Status: AC
Start: 2022-07-06 — End: 2022-10-04

## 2022-07-06 MED ORDER — VITAMIN D (ERGOCALCIFEROL) 1.25 MG (50000 UT) PO CAPS
1.25 MG (50000 UT) | ORAL_CAPSULE | ORAL | 0 refills | Status: AC
Start: 2022-07-06 — End: 2023-06-30

## 2022-07-06 MED ORDER — ONDANSETRON HCL 4 MG PO TABS
4 MG | ORAL_TABLET | Freq: Three times a day (TID) | ORAL | 0 refills | Status: AC | PRN
Start: 2022-07-06 — End: ?

## 2022-07-06 NOTE — Progress Notes (Signed)
There were no vitals taken for this visit.      Plan:   1/2. Morbid Obesity- Patient is schedule for ROBOTIC SINGLE ANATOMOSIS DUODENAL ILEOSTOMY, ROBOTIC SLEEVE GASTRECTOMY with Dr.Craig Smith on 07/28/2022 at [place] patient in regard to post diet restrictions, follow- up office visits, follow- up care. Patient as received educational booklet and vitamin list. Reviewed liver shrinking diet. She has been on the liver shrinking diet since August.   ERAS protocol reviewed:  Acetaminophen 1000 mg at noon and 8 pm day before surgery and may have clear liquids (no caffeine, no ETOH, no carbonation and calorie free) until 3 hours prior to surgery   Preadmission testing results reviewed with patient   Post operative prescriptions sent to pharmacy on file for AFTER surgery:    Acid suppression  Prilosec 40 mg x 30 days, then reassess need    Antiemetic Zofran 4 mg every 8 hours as needed for nausea #30   Antispasmodic Levsin 0.125 mg   Laxative:  Miralax 1 packet every day   DVT prophylaxis:      Lovenox 40 mg subcu daily for 14 days, early ambulation and mechanical compression, non-smoker for at least 90 days   Post op pain management:  Gabapentin 100-200 mg q 8 hours prn pain x 5 days; acetaminophen 1000 mg po q 8 hours prn; abdominal support / splinting; heating pad prn   Reviewed with patient that lifelong vitamin supplementation is recommended by provider as well as risks of non-compliance.    3. Sleep apnea- Bring CPAP with her to the hospital.   4. Hypercholesterolemia - follow up with PCP after surgery.   5. Vit D def- Ergocaliferol 50000 iu weekly.        Pt verbalized understanding and questions were answered to the best of my knowledge and ability.    Herbie Drape, was evaluated through a synchronous (real-time) audio-video encounter. The patient (or guardian if applicable) is aware that this is a billable service, which includes applicable co-pays. This Virtual Visit was conducted  with patient's (and/or legal guardian's) consent. Patient identification was verified, and a caregiver was present when appropriate.   The patient was located at Home: [place] [address]  Provider was located at [facility] (Appt Dept): [place] [address]       Total time spent for this encounter:  75    --Dineen Kid, APRN - NP on 07/06/2022 at 2:33 PM    An electronic signature was used to authenticate this note.         Signed By: Dineen Kid, APRN - NP     July 06, 2022

## 2022-07-06 NOTE — Progress Notes (Signed)
Identified patient with two patient identifiers (name and DOB). Reviewed chart in preparation for visit and have obtained necessary documentation.    Teresa Allen is a 46 y.o. female  Chief Complaint   Patient presents with    H&P     Scheduled for SADI, sleeve gastrectomy on 07/28/22 with Dr Ardell Isaacs     There were no vitals taken for this visit.    1. Have you been to the ER, urgent care clinic since your last visit?  Hospitalized since your last visit?no    2. Have you seen or consulted any other health care providers outside of the [facility] since your last visit?  Include any pap smears or colon screening. no

## 2022-07-18 ENCOUNTER — Ambulatory Visit: Admit: 2022-07-18 | Discharge: 2022-07-18 | Payer: PRIVATE HEALTH INSURANCE | Attending: Surgery | Primary: Family

## 2022-07-18 NOTE — Progress Notes (Signed)
Identified patient with two patient identifiers (name and DOB). Reviewed chart in preparation for visit and have obtained necessary documentation.    Teresa Allen is a 46 y.o. female  No chief complaint on file.    BP 105/70 (Site: Left Lower Arm, Position: Sitting, Cuff Size: Medium Adult)   Pulse 77   Temp 98 F (36.7 C) (Oral)   Ht 5\' 2"  (1.575 m)   Wt (!) 359 lb (162.8 kg)   SpO2 94%   BMI 65.66 kg/m     1. Have you been to the ER, urgent care clinic since your last visit?  Hospitalized since your last visit?no    2. Have you seen or consulted any other health care providers outside of the [facility] since your last visit?  Include any pap smears or colon screening. no

## 2022-07-18 NOTE — Progress Notes (Signed)
Surgery Progress Note    07/18/2022    CC: Obesity    Subjective:     Patient preparing for SADI procedure next week.  On the preoperative diet. Continues to lose weight.  Questions about surgery have been answered.    Constitutional: No fever or chills  Neurologic: No headache  Eyes: No scleral icterus or irritated eyes  Nose: No nasal pain or drainage  Mouth: No oral lesions or sore throat  Cardiac: No palpations or chest pain  Pulmonary: No cough or shortness of breath  Gastrointestinal: No nausea, emesis, diarrhea, or constipation  Genitourinary: No dysuria  Musculoskeletal: No muscle or joint tenderness  Skin: No rashes or lesions  Psychiatric: No anxiety or depressed mood    Objective:     Vitals:    07/18/22 1341   BP: 105/70   Pulse: 77   Temp: 98 F (36.7 C)   SpO2: 94%     Body mass index is 65.66 kg/m.  Wt 359 lbs    General: No acute distress, conversant  Eyes: PERRLA, no scleral icterus  HENT: Normocephalic without oral lesions  Neck: Trachea midline without LAD  Cardiac: Normal pulse rate and rhythm  Pulmonary: Symmetric chest rise with normal effort  GI: Soft, obese, NT, ND, no hernia, no splenomegaly  Skin: Warm without rash  Extremities: No edema or joint stiffness  Psych: Appropriate mood and affect    Assessment:     46 year old female preparing for SADI procedure next week    Plan:     Once again discussed the risk of bleeding, infection, leak.  Discussed the postoperative nutrition requirements and need for long-term vitamins.  Discussed need to follow the diet and exercise.  All questions about surgery answered.  We will see her next week.      Lamount Cranker, MD, FACS, [facility]  Bariatric and General Surgeon  [facility]

## 2022-07-18 NOTE — H&P (View-Only) (Signed)
Surgery Progress Note    07/18/2022    CC: Obesity    Subjective:     Patient preparing for SADI procedure next week.  On the preoperative diet. Continues to lose weight.  Questions about surgery have been answered.    Constitutional: No fever or chills  Neurologic: No headache  Eyes: No scleral icterus or irritated eyes  Nose: No nasal pain or drainage  Mouth: No oral lesions or sore throat  Cardiac: No palpations or chest pain  Pulmonary: No cough or shortness of breath  Gastrointestinal: No nausea, emesis, diarrhea, or constipation  Genitourinary: No dysuria  Musculoskeletal: No muscle or joint tenderness  Skin: No rashes or lesions  Psychiatric: No anxiety or depressed mood    Objective:     Vitals:    07/18/22 1341   BP: 105/70   Pulse: 77   Temp: 98 F (36.7 C)   SpO2: 94%     Body mass index is 65.66 kg/m.  Wt 359 lbs    General: No acute distress, conversant  Eyes: PERRLA, no scleral icterus  HENT: Normocephalic without oral lesions  Neck: Trachea midline without LAD  Cardiac: Normal pulse rate and rhythm  Pulmonary: Symmetric chest rise with normal effort  GI: Soft, obese, NT, ND, no hernia, no splenomegaly  Skin: Warm without rash  Extremities: No edema or joint stiffness  Psych: Appropriate mood and affect    Assessment:     46-year-old female preparing for SADI procedure next week    Plan:     Once again discussed the risk of bleeding, infection, leak.  Discussed the postoperative nutrition requirements and need for long-term vitamins.  Discussed need to follow the diet and exercise.  All questions about surgery answered.  We will see her next week.      Castiel Lauricella R Jaelyne Deeg, MD, FACS, FASMBS  Bariatric and General Surgeon  [facility]

## 2022-07-26 NOTE — Telephone Encounter (Signed)
Pt called and wants verification that Dr. Tamala Julian completed her forms for work ahead of her upcoming surgery.

## 2022-07-26 NOTE — Telephone Encounter (Signed)
I spoke with the patient. She informed me documents were went via My Chart. I told her I will find and complete today. Document completed and faxed to [employer]. A confirmation was received.

## 2022-07-28 ENCOUNTER — Inpatient Hospital Stay
Admit: 2022-07-28 | Discharge: 2022-07-31 | Disposition: A | Discharge status: Home / Self Care | Payer: PRIVATE HEALTH INSURANCE | Attending: Surgery | Admitting: Surgery

## 2022-07-28 LAB — POC PREGNANCY UR-QUAL: Preg Test, Ur: NEGATIVE

## 2022-07-28 MED ORDER — LIDOCAINE HCL (PF) 2 % IJ SOLN
2 % | INTRAMUSCULAR | Status: AC
Start: 2022-07-28 — End: ?

## 2022-07-28 MED ORDER — LACTATED RINGERS IV SOLN
INTRAVENOUS | Status: DC
Start: 2022-07-28 — End: 2022-07-28

## 2022-07-28 MED ORDER — PROPOFOL 100 MG/10ML IV EMUL
100 MG/10ML | INTRAVENOUS | Status: DC | PRN
Start: 2022-07-28 — End: 2022-07-28
  Administered 2022-07-28: 12:00:00 150 via INTRAVENOUS

## 2022-07-28 MED ORDER — ONDANSETRON HCL 4 MG/2ML IJ SOLN
4 MG/2ML | INTRAMUSCULAR | Status: AC
Start: 2022-07-28 — End: ?

## 2022-07-28 MED ORDER — FENTANYL CITRATE PF 50 MCG/ML IJ SOSY
50 MCG/ML | INTRAMUSCULAR | Status: DC | PRN
Start: 2022-07-28 — End: 2022-07-28
  Administered 2022-07-28 (×2): 50 via INTRAVENOUS

## 2022-07-28 MED ORDER — ACETAMINOPHEN 160 MG/5ML PO SOLN
160 MG/5ML | ORAL | Status: DC
Start: 2022-07-28 — End: 2022-07-28
  Administered 2022-07-28: 11:00:00 1000 mg via ORAL

## 2022-07-28 MED ORDER — NORMAL SALINE FLUSH 0.9 % IV SOLN
0.9 % | Freq: Two times a day (BID) | INTRAVENOUS | Status: DC
Start: 2022-07-28 — End: 2022-07-28

## 2022-07-28 MED ORDER — ONDANSETRON HCL 4 MG/2ML IJ SOLN
4 MG/2ML | INTRAMUSCULAR | Status: DC | PRN
Start: 2022-07-28 — End: 2022-07-28
  Administered 2022-07-28 (×2): 4 via INTRAVENOUS

## 2022-07-28 MED ORDER — HYDRALAZINE HCL 20 MG/ML IJ SOLN
20 MG/ML | INTRAMUSCULAR | Status: DC | PRN
Start: 2022-07-28 — End: 2022-07-28

## 2022-07-28 MED ORDER — SODIUM CHLORIDE 0.9 % IV SOLN
0.9 % | INTRAVENOUS | Status: DC | PRN
Start: 2022-07-28 — End: 2022-07-28

## 2022-07-28 MED ORDER — LIDOCAINE HCL (PF) 1 % IJ SOLN
1 % | Freq: Once | INTRAMUSCULAR | Status: DC | PRN
Start: 2022-07-28 — End: 2022-07-28

## 2022-07-28 MED ORDER — PROPOFOL 200 MG/20ML IV EMUL
200 MG/20ML | INTRAVENOUS | Status: AC
Start: 2022-07-28 — End: ?

## 2022-07-28 MED ORDER — NORMAL SALINE FLUSH 0.9 % IV SOLN
0.9 % | INTRAVENOUS | Status: DC | PRN
Start: 2022-07-28 — End: 2022-07-28

## 2022-07-28 MED ORDER — CEFAZOLIN SODIUM 3 G IV SOLR
3 g | INTRAVENOUS | Status: AC
Start: 2022-07-28 — End: 2022-07-28
  Administered 2022-07-28: 12:00:00 3000 mg via INTRAVENOUS

## 2022-07-28 MED ORDER — HYDROMORPHONE HCL 1 MG/ML IJ SOLN
1 MG/ML | INTRAMUSCULAR | Status: DC | PRN
Start: 2022-07-28 — End: 2022-07-28
  Administered 2022-07-28 (×2): .5 via INTRAVENOUS

## 2022-07-28 MED ORDER — MIDAZOLAM HCL (PF) 2 MG/2ML IJ SOLN
2 MG/ML | Freq: Once | INTRAMUSCULAR | Status: DC | PRN
Start: 2022-07-28 — End: 2022-07-28

## 2022-07-28 MED ORDER — GABAPENTIN 250 MG/5ML PO SOLN
250 MG/5ML | ORAL | Status: DC
Start: 2022-07-28 — End: 2022-07-28
  Administered 2022-07-28: 11:00:00 500 mg via ORAL

## 2022-07-28 MED ORDER — NORMAL SALINE FLUSH 0.9 % IV SOLN
0.9 % | INTRAVENOUS | Status: DC | PRN
Start: 2022-07-28 — End: 2022-07-28
  Administered 2022-07-28: 15:00:00 10 mL via INTRAVENOUS

## 2022-07-28 MED ORDER — PROCHLORPERAZINE EDISYLATE 10 MG/2ML IJ SOLN
10 MG/2ML | Freq: Four times a day (QID) | INTRAMUSCULAR | Status: DC | PRN
Start: 2022-07-28 — End: 2022-07-28

## 2022-07-28 MED ORDER — NORMAL SALINE FLUSH 0.9 % IV SOLN
0.9 % | Freq: Two times a day (BID) | INTRAVENOUS | Status: AC
Start: 2022-07-28 — End: 2022-07-31
  Administered 2022-07-29 – 2022-07-31 (×2): 10 mL via INTRAVENOUS

## 2022-07-28 MED ORDER — SODIUM CHLORIDE 0.9 % IV SOLN
0.9 % | INTRAVENOUS | Status: AC | PRN
Start: 2022-07-28 — End: 2022-07-31

## 2022-07-28 MED ORDER — HYOSCYAMINE SULFATE 0.125 MG SL SUBL
125 MCG | SUBLINGUAL | Status: DC | PRN
Start: 2022-07-28 — End: 2022-07-31

## 2022-07-28 MED ORDER — HYDROMORPHONE HCL PF 1 MG/ML IJ SOLN
1 MG/ML | INTRAMUSCULAR | Status: AC | PRN
Start: 2022-07-28 — End: 2022-07-28
  Administered 2022-07-28 (×2): 0.5 mg via INTRAVENOUS

## 2022-07-28 MED ORDER — ONDANSETRON HCL 4 MG/2ML IJ SOLN
4 MG/2ML | Freq: Four times a day (QID) | INTRAMUSCULAR | Status: DC | PRN
Start: 2022-07-28 — End: 2022-07-28

## 2022-07-28 MED ORDER — PANTOPRAZOLE SODIUM 40 MG IV SOLR
40 MG | Freq: Every day | INTRAVENOUS | Status: AC
Start: 2022-07-28 — End: 2022-07-31
  Administered 2022-07-29 – 2022-07-31 (×3): 40 mg via INTRAVENOUS

## 2022-07-28 MED ORDER — POTASSIUM CHLORIDE IN NACL 20-0.45 MEQ/L-% IV SOLN
20-0.45- MEQ/L-% | INTRAVENOUS | Status: AC
Start: 2022-07-28 — End: 2022-07-31
  Administered 2022-07-28 – 2022-07-31 (×8): 100 mL/h via INTRAVENOUS

## 2022-07-28 MED ORDER — METRONIDAZOLE 500 MG/100ML IV SOLN
500 MG/100ML | INTRAVENOUS | Status: AC
Start: 2022-07-28 — End: 2022-07-28
  Administered 2022-07-28: 12:00:00 500 mg via INTRAVENOUS

## 2022-07-28 MED ORDER — DIPHENHYDRAMINE HCL 50 MG/ML IJ SOLN
50 MG/ML | Freq: Four times a day (QID) | INTRAMUSCULAR | Status: AC | PRN
Start: 2022-07-28 — End: 2022-07-31

## 2022-07-28 MED ORDER — THIAMINE HCL 100 MG/ML IJ SOLN
100 MG/ML | Freq: Once | INTRAMUSCULAR | Status: AC
Start: 2022-07-28 — End: 2022-07-29
  Administered 2022-07-29: 13:00:00 100 mg via INTRAVENOUS

## 2022-07-28 MED ORDER — PROCHLORPERAZINE EDISYLATE 10 MG/2ML IJ SOLN
10 MG/2ML | Freq: Once | INTRAMUSCULAR | Status: AC | PRN
Start: 2022-07-28 — End: 2022-07-28
  Administered 2022-07-28: 15:00:00 5 mg via INTRAVENOUS

## 2022-07-28 MED ORDER — KETAMINE HCL 50 MG/5ML IJ SOSY
50 MG/5ML | INTRAMUSCULAR | Status: AC
Start: 2022-07-28 — End: ?

## 2022-07-28 MED ORDER — PROCHLORPERAZINE EDISYLATE 10 MG/2ML IJ SOLN
10 MG/2ML | Freq: Four times a day (QID) | INTRAMUSCULAR | Status: DC | PRN
Start: 2022-07-28 — End: 2022-07-31
  Administered 2022-07-29: 02:00:00 10 mg via INTRAVENOUS

## 2022-07-28 MED ORDER — SUCCINYLCHOLINE CHLORIDE 200 MG/10ML IV SOSY
200 MG/10ML | INTRAVENOUS | Status: AC
Start: 2022-07-28 — End: ?

## 2022-07-28 MED ORDER — ENOXAPARIN SODIUM 40 MG/0.4ML IJ SOSY
40 MG/0.4ML | Freq: Two times a day (BID) | INTRAMUSCULAR | Status: DC
Start: 2022-07-28 — End: 2022-07-31
  Administered 2022-07-29 – 2022-07-31 (×5): 40 mg via SUBCUTANEOUS

## 2022-07-28 MED ORDER — SIMETHICONE 80 MG PO CHEW
80 MG | Freq: Four times a day (QID) | ORAL | Status: AC | PRN
Start: 2022-07-28 — End: 2022-07-31

## 2022-07-28 MED ORDER — DEXMEDETOMIDINE HCL 200 MCG/2ML IV SOLN
200 MCG/2ML | INTRAVENOUS | Status: DC | PRN
Start: 2022-07-28 — End: 2022-07-28
  Administered 2022-07-28 (×2): 10 via INTRAVENOUS

## 2022-07-28 MED ORDER — HYDRALAZINE HCL 20 MG/ML IJ SOLN
20 MG/ML | INTRAMUSCULAR | Status: AC | PRN
Start: 2022-07-28 — End: 2022-07-31

## 2022-07-28 MED ORDER — HYDROMORPHONE HCL 2 MG PO TABS
2 MG | ORAL | Status: DC | PRN
Start: 2022-07-28 — End: 2022-07-31
  Administered 2022-07-30: 03:00:00 2 mg via ORAL

## 2022-07-28 MED ORDER — ONDANSETRON HCL 4 MG/2ML IJ SOLN
4 MG/2ML | INTRAMUSCULAR | Status: DC | PRN
Start: 2022-07-28 — End: 2022-07-28

## 2022-07-28 MED ORDER — CYANOCOBALAMIN 1000 MCG/ML IJ SOLN
1000 MCG/ML | Freq: Once | INTRAMUSCULAR | Status: AC
Start: 2022-07-28 — End: 2022-07-29
  Administered 2022-07-29: 13:00:00 1000 ug via INTRAMUSCULAR

## 2022-07-28 MED ORDER — SUCCINYLCHOLINE CHLORIDE 200 MG/10ML IV SOSY
200 MG/10ML | INTRAVENOUS | Status: DC | PRN
Start: 2022-07-28 — End: 2022-07-28
  Administered 2022-07-28: 12:00:00 160 via INTRAVENOUS

## 2022-07-28 MED ORDER — ACETAMINOPHEN 500 MG PO TABS
500 MG | Freq: Once | ORAL | Status: DC
Start: 2022-07-28 — End: 2022-07-28

## 2022-07-28 MED ORDER — MIDAZOLAM HCL 2 MG/2ML IJ SOLN
2 MG/ML | INTRAMUSCULAR | Status: DC | PRN
Start: 2022-07-28 — End: 2022-07-28
  Administered 2022-07-28: 12:00:00 5 via INTRAVENOUS

## 2022-07-28 MED ORDER — ROCURONIUM BROMIDE 50 MG/5ML IV SOLN
50 MG/5ML | INTRAVENOUS | Status: DC | PRN
Start: 2022-07-28 — End: 2022-07-28
  Administered 2022-07-28: 13:00:00 25 via INTRAVENOUS
  Administered 2022-07-28: 12:00:00 45 via INTRAVENOUS
  Administered 2022-07-28: 12:00:00 5 via INTRAVENOUS

## 2022-07-28 MED ORDER — ONDANSETRON 4 MG PO TBDP
4 MG | Freq: Three times a day (TID) | ORAL | Status: DC | PRN
Start: 2022-07-28 — End: 2022-07-28

## 2022-07-28 MED ORDER — MAGNESIUM SULFATE 2 GM/50ML IV SOLN
2 GM/50ML | INTRAVENOUS | Status: DC | PRN
Start: 2022-07-28 — End: 2022-07-28
  Administered 2022-07-28: 13:00:00 2000 via INTRAVENOUS

## 2022-07-28 MED ORDER — HYDROMORPHONE HCL 1 MG/ML IJ SOLN
1 MG/ML | INTRAMUSCULAR | Status: AC
Start: 2022-07-28 — End: ?

## 2022-07-28 MED ORDER — ROCURONIUM BROMIDE 50 MG/5ML IV SOLN
50 MG/5ML | INTRAVENOUS | Status: AC
Start: 2022-07-28 — End: ?

## 2022-07-28 MED ORDER — SCOPOLAMINE 1 MG/3DAYS TD PT72
1 MG/3DAYS | TRANSDERMAL | Status: DC
Start: 2022-07-28 — End: 2022-07-28
  Administered 2022-07-28: 11:00:00 1 via TRANSDERMAL

## 2022-07-28 MED ORDER — LIDOCAINE IN D5W 4-5 MG/ML-% IV SOLN
4-5 MG/ML-% | INTRAVENOUS | Status: DC | PRN
Start: 2022-07-28 — End: 2022-07-28
  Administered 2022-07-28: 12:00:00 2 via INTRAVENOUS

## 2022-07-28 MED ORDER — FENTANYL CITRATE (PF) 100 MCG/2ML IJ SOLN
100 MCG/2ML | INTRAMUSCULAR | Status: DC | PRN
Start: 2022-07-28 — End: 2022-07-28

## 2022-07-28 MED ORDER — HYDROMORPHONE HCL 2 MG PO TABS
2 MG | ORAL | Status: AC | PRN
Start: 2022-07-28 — End: 2022-07-31
  Administered 2022-07-29 (×2): 4 mg via ORAL

## 2022-07-28 MED ORDER — LORAZEPAM 2 MG/ML IJ SOLN
2 MG/ML | Freq: Four times a day (QID) | INTRAMUSCULAR | Status: DC | PRN
Start: 2022-07-28 — End: 2022-07-29

## 2022-07-28 MED ORDER — NORMAL SALINE FLUSH 0.9 % IV SOLN
0.9 % | INTRAVENOUS | Status: AC | PRN
Start: 2022-07-28 — End: 2022-07-31

## 2022-07-28 MED ORDER — SCOPOLAMINE 1 MG/3DAYS TD PT72
13 MG/3DAYS | TRANSDERMAL | Status: AC
Start: 2022-07-28 — End: 2022-07-31

## 2022-07-28 MED ORDER — HYDROMORPHONE HCL PF 1 MG/ML IJ SOLN
1 MG/ML | INTRAMUSCULAR | Status: AC | PRN
Start: 2022-07-28 — End: 2022-07-31
  Administered 2022-07-28 – 2022-07-29 (×3): 1 mg via INTRAVENOUS

## 2022-07-28 MED ORDER — ONDANSETRON HCL 4 MG/2ML IJ SOLN
42 MG/2ML | INTRAMUSCULAR | Status: AC | PRN
Start: 2022-07-28 — End: 2022-07-31
  Administered 2022-07-29 – 2022-07-31 (×3): 4 mg via INTRAVENOUS

## 2022-07-28 MED ORDER — FENTANYL CITRATE (PF) 100 MCG/2ML IJ SOLN
100 MCG/2ML | Freq: Once | INTRAMUSCULAR | Status: DC | PRN
Start: 2022-07-28 — End: 2022-07-28

## 2022-07-28 MED ORDER — BUPIVACAINE HCL (PF) 0.5 % IJ SOLN
0.5 % | INTRAMUSCULAR | Status: AC
Start: 2022-07-28 — End: ?

## 2022-07-28 MED ORDER — LACTATED RINGERS IV SOLN
INTRAVENOUS | Status: DC
Start: 2022-07-28 — End: 2022-07-28
  Administered 2022-07-28: 11:00:00 via INTRAVENOUS

## 2022-07-28 MED ORDER — FENTANYL CITRATE (PF) 100 MCG/2ML IJ SOLN
100 MCG/2ML | INTRAMUSCULAR | Status: AC
Start: 2022-07-28 — End: ?

## 2022-07-28 MED ORDER — ONDANSETRON HCL 4 MG/2ML IJ SOLN
4 MG/2ML | Freq: Once | INTRAMUSCULAR | Status: DC | PRN
Start: 2022-07-28 — End: 2022-07-28

## 2022-07-28 MED ORDER — SODIUM CHLORIDE (PF) 0.9 % IJ SOLN
0.9 % | INTRAMUSCULAR | Status: AC
Start: 2022-07-28 — End: ?

## 2022-07-28 MED ORDER — LACTATED RINGERS IV SOLN
INTRAVENOUS | Status: DC
Start: 2022-07-28 — End: 2022-07-28
  Administered 2022-07-28: 12:00:00 via INTRAVENOUS

## 2022-07-28 MED ORDER — DEXAMETHASONE SODIUM PHOSPHATE 4 MG/ML IJ SOLN
4 MG/ML | INTRAMUSCULAR | Status: AC
Start: 2022-07-28 — End: ?

## 2022-07-28 MED ORDER — MAGNESIUM SULFATE 2000 MG/50 ML IVPB PREMIX
2 GM/50ML | INTRAVENOUS | Status: AC
Start: 2022-07-28 — End: ?

## 2022-07-28 MED ORDER — GABAPENTIN 600 MG PO TABS
600 MG | Freq: Two times a day (BID) | ORAL | Status: AC
Start: 2022-07-28 — End: 2022-07-31
  Administered 2022-07-29 – 2022-07-31 (×5): 300 mg via ORAL

## 2022-07-28 MED ORDER — INDOCYANINE GREEN 25 MG IV SOLR
25 MG | Freq: Once | INTRAVENOUS | Status: AC
Start: 2022-07-28 — End: 2022-07-28
  Administered 2022-07-28: 11:00:00 1.5 mg via INTRAVENOUS

## 2022-07-28 MED ORDER — BUPIVACAINE HCL (PF) 0.5 % IJ SOLN
0.5 % | INTRAMUSCULAR | Status: DC | PRN
Start: 2022-07-28 — End: 2022-07-28
  Administered 2022-07-28: 14:00:00 30 via INTRAMUSCULAR

## 2022-07-28 MED ORDER — ACETAMINOPHEN 160 MG/5ML PO SOLN
160 | Freq: Four times a day (QID) | ORAL | Status: DC | PRN
Start: 2022-07-28 — End: 2022-07-31
  Administered 2022-07-30 – 2022-07-31 (×2): 1000 mg via ORAL

## 2022-07-28 MED ORDER — METOCLOPRAMIDE HCL 5 MG/ML IJ SOLN
5 MG/ML | Freq: Four times a day (QID) | INTRAMUSCULAR | Status: DC
Start: 2022-07-28 — End: 2022-07-31
  Administered 2022-07-28 – 2022-07-31 (×12): 10 mg via INTRAVENOUS

## 2022-07-28 MED ORDER — LIDOCAINE HCL (PF) 2 % IJ SOLN
2 % | INTRAMUSCULAR | Status: DC | PRN
Start: 2022-07-28 — End: 2022-07-28
  Administered 2022-07-28: 12:00:00 100 via INTRAVENOUS

## 2022-07-28 MED ORDER — HYDROXYZINE HCL 10 MG PO TABS
10 MG | Freq: Three times a day (TID) | ORAL | Status: DC | PRN
Start: 2022-07-28 — End: 2022-07-31

## 2022-07-28 MED ORDER — CYCLOBENZAPRINE HCL 10 MG PO TABS
10 MG | Freq: Three times a day (TID) | ORAL | Status: AC | PRN
Start: 2022-07-28 — End: 2022-07-31

## 2022-07-28 MED ORDER — MIDAZOLAM HCL 5 MG/5ML IJ SOLN
5 MG/ML | INTRAMUSCULAR | Status: AC
Start: 2022-07-28 — End: ?

## 2022-07-28 MED ORDER — DEXAMETHASONE SODIUM PHOSPHATE 4 MG/ML IJ SOLN
4 MG/ML | INTRAMUSCULAR | Status: DC | PRN
Start: 2022-07-28 — End: 2022-07-28
  Administered 2022-07-28: 12:00:00 4 via INTRAVENOUS

## 2022-07-28 MED ORDER — ONDANSETRON 4 MG PO TBDP
4 MG | Freq: Three times a day (TID) | ORAL | Status: AC | PRN
Start: 2022-07-28 — End: 2022-07-31
  Administered 2022-07-30: 03:00:00 4 mg via ORAL

## 2022-07-28 MED ORDER — LIDOCAINE IN D5W 4-5 MG/ML-% IV SOLN
4-5 MG/ML-% | INTRAVENOUS | Status: AC
Start: 2022-07-28 — End: ?

## 2022-07-28 MED ORDER — OXYCODONE HCL 5 MG PO TABS
5 MG | Freq: Once | ORAL | Status: DC | PRN
Start: 2022-07-28 — End: 2022-07-28

## 2022-07-28 MED ORDER — DEXMEDETOMIDINE HCL 200 MCG/2ML IV SOLN
200 MCG/2ML | INTRAVENOUS | Status: AC
Start: 2022-07-28 — End: ?

## 2022-07-28 MED ORDER — SUGAMMADEX SODIUM 200 MG/2ML IV SOLN
200 MG/2ML | INTRAVENOUS | Status: AC
Start: 2022-07-28 — End: ?

## 2022-07-28 MED FILL — METOCLOPRAMIDE HCL 5 MG/ML IJ SOLN: 5 MG/ML | INTRAMUSCULAR | Qty: 2

## 2022-07-28 MED FILL — POTASSIUM CHLORIDE IN NACL 20-0.45 MEQ/L-% IV SOLN: INTRAVENOUS | Qty: 1000

## 2022-07-28 MED FILL — ONDANSETRON HCL 4 MG/2ML IJ SOLN: 4 MG/2ML | INTRAMUSCULAR | Qty: 2

## 2022-07-28 MED FILL — TRANSDERM-SCOP 1 MG/3DAYS TD PT72: 1 MG/3DAYS | TRANSDERMAL | Qty: 1

## 2022-07-28 MED FILL — ACETAMINOPHEN 650 MG/20.3ML PO SOLN: 650 MG/20.3ML | ORAL | Qty: 40.6

## 2022-07-28 MED FILL — LACTATED RINGERS IV SOLN: INTRAVENOUS | Qty: 1000

## 2022-07-28 MED FILL — GABAPENTIN 250 MG/5ML PO SOLN: 250 MG/5ML | ORAL | Qty: 10

## 2022-07-28 MED FILL — NORMAL SALINE FLUSH 0.9 % IV SOLN: 0.9 % | INTRAVENOUS | Qty: 40

## 2022-07-28 MED FILL — HYDROMORPHONE HCL 1 MG/ML IJ SOLN: 1 MG/ML | INTRAMUSCULAR | Qty: 1

## 2022-07-28 MED FILL — BRIDION 200 MG/2ML IV SOLN: 200 MG/2ML | INTRAVENOUS | Qty: 2

## 2022-07-28 MED FILL — FENTANYL CITRATE (PF) 100 MCG/2ML IJ SOLN: 100 MCG/2ML | INTRAMUSCULAR | Qty: 2

## 2022-07-28 MED FILL — ROCURONIUM BROMIDE 50 MG/5ML IV SOLN: 50 MG/5ML | INTRAVENOUS | Qty: 5

## 2022-07-28 MED FILL — PRECEDEX 200 MCG/2ML IV SOLN: 200 MCG/2ML | INTRAVENOUS | Qty: 2

## 2022-07-28 MED FILL — SENSORCAINE-MPF 0.5 % IJ SOLN: 0.5 % | INTRAMUSCULAR | Qty: 30

## 2022-07-28 MED FILL — INDOCYANINE GREEN 25 MG IV SOLR: 25 MG | INTRAVENOUS | Qty: 10

## 2022-07-28 MED FILL — SUCCINYLCHOLINE CHLORIDE 200 MG/10ML IV SOSY: 200 MG/10ML | INTRAVENOUS | Qty: 10

## 2022-07-28 MED FILL — DIPRIVAN 200 MG/20ML IV EMUL: 200 MG/20ML | INTRAVENOUS | Qty: 20

## 2022-07-28 MED FILL — METRONIDAZOLE 500 MG/100ML IV SOLN: 500 MG/100ML | INTRAVENOUS | Qty: 100

## 2022-07-28 MED FILL — DEXAMETHASONE SODIUM PHOSPHATE 4 MG/ML IJ SOLN: 4 MG/ML | INTRAMUSCULAR | Qty: 1

## 2022-07-28 MED FILL — SODIUM CHLORIDE (PF) 0.9 % IJ SOLN: 0.9 % | INTRAMUSCULAR | Qty: 10

## 2022-07-28 MED FILL — CEFAZOLIN SODIUM 3 G IV SOLR: 3 g | INTRAVENOUS | Qty: 3000

## 2022-07-28 MED FILL — LIDOCAINE IN D5W 4-5 MG/ML-% IV SOLN: 4-5 MG/ML-% | INTRAVENOUS | Qty: 500

## 2022-07-28 MED FILL — PROCHLORPERAZINE EDISYLATE 10 MG/2ML IJ SOLN: 10 MG/2ML | INTRAMUSCULAR | Qty: 2

## 2022-07-28 MED FILL — LIDOCAINE HCL (PF) 2 % IJ SOLN: 2 % | INTRAMUSCULAR | Qty: 5

## 2022-07-28 MED FILL — MAGNESIUM SULFATE 2 GM/50ML IV SOLN: 2 GM/50ML | INTRAVENOUS | Qty: 50

## 2022-07-28 MED FILL — KETAMINE HCL 50 MG/5ML IJ SOSY: 50 MG/5ML | INTRAMUSCULAR | Qty: 5

## 2022-07-28 MED FILL — MIDAZOLAM HCL 5 MG/5ML IJ SOLN: 5 MG/ML | INTRAMUSCULAR | Qty: 5

## 2022-07-28 NOTE — Other (Signed)
0.9% NORMAL SALINE, 1 LITER, USED PRN ON STERILE FIELD.     1 LITER NS PRN VIA SUCTION IRRIGATOR.

## 2022-07-28 NOTE — Interval H&P Note (Signed)
Update History & Physical    The patient's History and Physical of 07/18/22 was reviewed with the patient and I examined the patient. There was no change. The surgical site was confirmed by the patient and me.     Plan: The risks, benefits, expected outcome, and alternative to the recommended procedure have been discussed with the patient. Patient understands and wants to proceed with the procedure.     Electronically signed by Lamount Cranker, MD on 07/28/2022 at 6:39 AM

## 2022-07-28 NOTE — Other (Signed)
UPDATE WITH PATIENT'S FRIEND (RASHEEDAH) ATTEMPTED AT  0902.

## 2022-07-28 NOTE — Op Note (Signed)
OPERATIVE NOTE    Date of Procedure: 07/28/2022     Preoperative Diagnosis: Morbid obesity ([place]) [E66.01]  Postoperative Diagnosis: Post-Op Diagnosis Codes:     * Morbid obesity ([place]) [E66.01]  Meckels diverticulum      Procedure: Procedure(s):  ROBOTIC SINGLE ANATOMOSIS DUODENAL ILEOSTOMY, ROBOTIC SLEEVE GASTRECTOMY (E R A S), RESECTION OF MECKEL'S DIVERTICULUM    Surgeon: Lamount Cranker, MD  Assistant(s): Johnathan Hausen - They were critically important in the exposure and key portions of the case including entry, exposure, instrument exchange, and closure of the abdomen.  Surgical Staff: Circulator: Talmage Coin, RN  Relief Circulator: Ranelle Oyster, RN  Scrub Person First: Warnell Forester  Physician Assistant: Johnathan Hausen, PA    Anesthesia: General   Indications: 46 y/o F presents with morbid obesity for SADi  Findings: Meckel's diverticulum found at 19 cm from TI and resected. Otherwise normal SADI procedure.    Description of Operation: NONIE LOCHNER was identified in the pre-operative holding area. Informed consent was obtained after a complete discussion of risks, benefits and alternatives to surgery were had with the patient.    The patient was brought back to the operating room and placed under general endotracheal anesthesia in the supine position on the operating room table with right arm tucked. The patient was then prepped and draped in the usual sterile fashion. A timeout was performed.      We then injected local anesthetic 15 cm below the xiphoid to the left of midline. A 5 mm incision was then made using an 11 blade. We entered the abdomen safely with a 5 mm optiview trocar and insufflated the abdomen. Next, we placed a 12 mm trocar in the right mid abdomen along the midclavicular line followed by two 8 mm trocars in the left mid abdomen. We then up sized the 5 mm trocar to an 8 mm trocar.  An additional 8 mm trocar was placed in the right lateral abdomen for an assistant trocar. I placed  a cholangiogram sheath in the RUQ.    A Nathanson retractor was then inserted in the epigastrium to retract the left lobe of the liver.     The patient was then placed in steep reverse Trendelenburg. Hiatal inspected demonstrated no hiatal hernia.    I began by using the vessel sealer to divided the omentum at the site of the future DI anastomosis    We identified the terminal ileum.  We then counted back 300 cm and marked the proximal and distal aspects with a marking pen.    While doing this, at 45 cm we encountered a small bowel Meckel diverticulum.  Given her complicated anatomy and risk for future infection and/or bleeding we elected to resect this.  I used a 60 mm white reinforced stapler load and transected the diverticulum flush from the bowel movement in a transverse orientation to not narrow lumen to complete the small bowel resection.  We later removed the specimen. We then continued our counting to 300 cm from TI. We then pexied the bowel to the RUQ with 3-0 V loc suture to the omentum.    Next, using the vessel sealer I incised the gastrocolic ligament along the greater curvature. We took the vessel sealer up the greater curvature of the stomach all the way up to the short gastric vessels. These were taken down meticulously with the vessel sealer. Dissection was continued up to the left crus and we released the Angle of His.  Next, we turned our attention to the gastric antrum. The remainder of the greater curvature the stomach was taken down with the vessel sealer toward the antum. We continued our dissection towards the antrum and onto the first portion of the duodenum. We carefully dissected D1 off of the pancreatic head.  We created a window for future D1 transection.  We ensured all posterior attachments were freed. We used used ICG to confirm biliary and duodenal anatomy.    Next, we passed the 40 [place] ViSiGi 3D down the esophagus safely into the stomach. It was positioned appropriately into  the antrum and placed on suction.    We then used a 60 mm Sure-form green robotic stapler load with reinforcement for our first staple load followed by another reinforced green load and then multiple blue loads with reinforcement to finish pouch careful to not narrow the pouch. We were at least 1 cm off of the GE junction on the last fire. I used a 3-0 V loc to pexy the staple line back to the omentum to decrease scalloping of the staple line.    The specimen was left in the left upper quadrant.    Next, we repositioned the Nathanson retractor to retract more of the right lobe of the liver.  We clearly identified the duodenal bulb.  We also identified the porta hepatis.  Ensured an adequate posterior dissection.  We then used a reinforced blue 60 mm stapler load to transect D1 2 cm past the pylorus. This was hemostatic.    Next, we once again confirmed orientation of our ileum and I used a 3-0 interlocked 6 inch V-Loc to approximate the ileal loop to the duodenal stump in an end-to-side fashion.  I then used scissors with energy to create a enterotomy and duodenotomy of roughly 2 cm size. I then used a 3-0 interlocked 6 inch V-Loc suture to perform the inner layer circumferential closure. The tails were tied together anteriorly. I then used the previously placed outside 3-0 V-Loc and brought this out to perform an anterior second layer closure.  The tails were tied together along the anterior/inferior margin. The needles were removed.       We then used a 2-0 permanent V-Loc suture to close the mesenteric defect. I used a 2-0 Vicryl to pexy the afferent loop of bowel up to the antrum to avoid kinking. The needles were removed.    Next, we performed a leak test using the ViSiGi and bulb insufflation to 35 mmHg. There was no obvious leak under pressure testing and there was a patent anastomosis.    Next, we undocked the robot. We placed a 0 PDS suture transfascially at the 12 mm trocar site. We removed the gastric  specimen and diverticulum. We tied down the PDS suture.    The Nathanson retractor was removed.    We removed the trocars under direct visualization. The dermis was closed with 4-0 Monocryl followed by Dermabond for the skin.    At the end of the procedure all instrument, needle, and sponge counts were correct. I was present and scrubbed throughout the entirety of the case. The patient awoke from anesthesia and was extubated without complication and sent to PACU in stable condition.      Estimated Blood Loss: 10 mL    Specimens:   ID Type Source Tests Collected by Time Destination   1 : GASTRIC FUNDUS Tissue Stomach SURGICAL PATHOLOGY Darliss Cheney, MD 07/28/2022 [phone]    2 :  [facility] DIVERTICULUM Tissue Tissue SURGICAL PATHOLOGY Darliss Cheney, MD 07/28/2022 [phone]         Complications: None    Implants:   Implant Name Type Inv. Item Serial No. Manufacturer Lot No. LRB No. Used Action   ECHELON ENDOPATH STAPLE LINE REINFORCEMENT   NA  [id number] N/A 4 Implanted         Darliss Cheney, MD, FACS, [facility]  Bariatric and General Surgeon  [facility]  07/28/2022

## 2022-07-28 NOTE — Anesthesia Post-Procedure Evaluation (Signed)
Department of Anesthesiology  Postprocedure Note    Patient: Teresa Allen  MRN: [id number]  Birthdate: 05/31/1976  Date of evaluation: 07/28/2022      Procedure Summary     Date: 07/28/22 Room / Location: [facility] MAIN OR 05 / [place] MAIN OR    Anesthesia Start: 0759 Anesthesia Stop: 22    Procedure: ROBOTIC SINGLE ANATOMOSIS DUODENAL ILEOSTOMY, ROBOTIC SLEEVE GASTRECTOMY (E R A S), RESECTION OF MEKEALS DIVERTICULUM (Abdomen) Diagnosis:       Morbid obesity ([place])      (Morbid obesity ([place]) [E66.01])    Providers: Lamount Cranker, MD Responsible Provider: Bernadette Hoit, DO    Anesthesia Type: General ASA Status: 3          Anesthesia Type: General    Aldrete Phase I: Aldrete Score: 8    Aldrete Phase II:        Anesthesia Post Evaluation    Patient location during evaluation: PACU  Patient participation: complete - patient participated  Level of consciousness: awake and alert  Pain score: 0  Airway patency: patent  Nausea & Vomiting: no nausea and no vomiting  Complications: no  Cardiovascular status: hemodynamically stable  Respiratory status: acceptable  Hydration status: euvolemic  Pain management: adequate

## 2022-07-28 NOTE — Anesthesia Pre-Procedure Evaluation (Signed)
Department of Anesthesiology  Preprocedure Note       Name:  Teresa Allen   Age:  46 y.o.  DOB:  [birth year]                                          MRN:  [id number]         Date:  07/28/2022      Surgeon: Moishe Spice):  Darliss Cheney, MD    Procedure: Procedure(s):  ROBOTIC SINGLE ANATOMOSIS DUODENAL ILEOSTOMY, ROBOTIC SLEEVE GASTRECTOMY (E R A S)    Medications prior to admission:   Prior to Admission medications    Medication Sig Start Date End Date Taking? Authorizing Provider   polyethylene glycol (GLYCOLAX) 17 GM/SCOOP powder Take 17 g by mouth daily  Patient not taking: Reported on 07/18/2022 07/06/22 10/04/22  Humphrey Rolls, APRN - NP   ondansetron (ZOFRAN) 4 MG tablet Take 1 tablet by mouth every 8 hours as needed for Nausea or Vomiting  Patient not taking: Reported on 07/18/2022 07/06/22   Humphrey Rolls, APRN - NP   gabapentin (NEURONTIN) 100 MG capsule Take 1-2 capsules by mouth 3 times daily for 5 days. Max Daily Amount: 600 mg  Patient not taking: Reported on 07/18/2022 07/06/22 07/11/22  Humphrey Rolls, APRN - NP   omeprazole (PRILOSEC) 40 MG delayed release capsule Take 1 capsule by mouth every morning (before breakfast)  Patient not taking: Reported on 07/18/2022 07/06/22   Humphrey Rolls, APRN - NP   Hyoscyamine Sulfate SL (LEVSIN/SL) 0.125 MG SUBL Place 0.125 mg under the tongue every 6 hours as needed (cramping)  Patient not taking: Reported on 07/28/2022 07/06/22   Humphrey Rolls, APRN - NP   vitamin D (ERGOCALCIFEROL) 1.25 MG (50000 UT) CAPS capsule Take 1 capsule by mouth once a week  Patient not taking: Reported on 07/18/2022 07/06/22   Humphrey Rolls, APRN - NP   enoxaparin (LOVENOX) 40 MG/0.4ML Inject 0.4 mLs into the skin daily for 14 days  Patient not taking: Reported on 07/18/2022 07/28/22 08/11/22  Humphrey Rolls, APRN - NP   Multiple Vitamins-Minerals (MULTIVITAL PO) Take by mouth    [provider]   calcium carb-cholecalciferol (CALCIUM 600+D) 600-10 MG-MCG TABS per tab Take 1 tablet by  mouth daily    [provider]   rosuvastatin (CRESTOR) 10 MG tablet Take 1 tablet by mouth nightly 12/01/21   Automatic Reconciliation, Ar       Current medications:    Current Facility-Administered Medications   Medication Dose Route Frequency Provider Last Rate Last Admin   . lactated ringers IV soln infusion   IntraVENous Continuous Darliss Cheney, MD 500 mL/hr at 07/28/22 0657 New Bag at 07/28/22 0657   . sodium chloride flush 0.9 % injection 5-40 mL  5-40 mL IntraVENous 2 times per day Darliss Cheney, MD       . sodium chloride flush 0.9 % injection 5-40 mL  5-40 mL IntraVENous PRN Darliss Cheney, MD       . 0.9 % sodium chloride infusion   IntraVENous PRN Darliss Cheney, MD       . ceFAZolin Sodium (ANCEF) 3,000 mg in sodium chloride 0.9 % 100 mL (mini-bag)  3,000 mg IntraVENous On Call to OR Darliss Cheney, MD        And   .  metronidazole (FLAGYL) 500 mg in 0.9% NaCl 100 mL IVPB premix  500 mg IntraVENous On Call to [place], MD       . scopolamine (TRANSDERM-SCOP) transdermal patch 1 patch  1 patch TransDERmal Q72H Lamount Cranker, MD   1 patch at 07/28/22 0705   . gabapentin (NEURONTIN) 250 MG/5ML solution 500 mg  500 mg Oral 30 Min Pre-Op Lamount Cranker, MD   500 mg at 07/28/22 0707   . acetaminophen (TYLENOL) 160 MG/5ML solution 1,000 mg  1,000 mg Oral 30 Min Pre-Op Lamount Cranker, MD   1,000 mg at 07/28/22 0707   . lidocaine PF 1 % injection 1 mL  1 mL IntraDERmal Once PRN Annabelle Harman M, DO       . fentaNYL (SUBLIMAZE) injection 100 mcg  100 mcg IntraVENous Once PRN Annabelle Harman M, DO       . lactated ringers IV soln infusion   IntraVENous Continuous Annabelle Harman M, DO       . sodium chloride flush 0.9 % injection 5-40 mL  5-40 mL IntraVENous 2 times per day Annabelle Harman M, DO       . sodium chloride flush 0.9 % injection 5-40 mL  5-40 mL IntraVENous PRN Annabelle Harman M, DO       . 0.9 % sodium chloride infusion   IntraVENous PRN Annabelle Harman M, DO       . midazolam PF (VERSED) injection  2 mg  2 mg IntraVENous Once PRN Annabelle Harman M, DO       . fentaNYL (SUBLIMAZE) injection 100 mcg  100 mcg IntraVENous Once PRN Annabelle Harman M, DO       . lactated ringers IV soln infusion   IntraVENous Continuous Annabelle Harman M, DO       . sodium chloride flush 0.9 % injection 5-40 mL  5-40 mL IntraVENous 2 times per day Annabelle Harman M, DO       . sodium chloride flush 0.9 % injection 5-40 mL  5-40 mL IntraVENous PRN Annabelle Harman M, DO       . 0.9 % sodium chloride infusion   IntraVENous PRN Annabelle Harman M, DO       . midazolam PF (VERSED) injection 2 mg  2 mg IntraVENous Once PRN Bernadette Hoit, DO           Allergies:    Allergies   Allergen Reactions   . Morphine Headaches       Problem List:    Patient Active Problem List   Diagnosis Code   . Elevated liver enzymes R74.8   . Pneumonia due to COVID-19 virus U07.1, J12.82   . Acute respiratory failure with hypoxia (HCC) J96.01   . SIRS (systemic inflammatory response syndrome) (HCC) R65.10   . COVID-19 U07.1   . Morbid obesity ([place]) E66.01       Past Medical History:        Diagnosis Date   . COVID-19 2021   . High cholesterol    . Hypotension 2021    on going issue due to prior covid infection   . Sleep apnea        Past Surgical History:        Procedure Laterality Date   . BACK SURGERY  2018    discectomy   . CHOLECYSTECTOMY, LAPAROSCOPIC  2012   . DILATION AND EVACUATION  2008   . TONSILLECTOMY  1980's  Social History:    Social History     Tobacco Use   . Smoking status: Never   . Smokeless tobacco: Never   Substance Use Topics   . Alcohol use: Never                                Counseling given: Not Answered      Vital Signs (Current):   Vitals:    07/28/22 0618   BP: (!) 114/55   Pulse: 72   Resp: 20   Temp: 98 F (36.7 C)   TempSrc: Oral   SpO2: 96%   Weight: (!) 162.5 kg (358 lb 4 oz)   Height: 1.575 m (5\' 2" )                                              BP Readings from Last 3 Encounters:   07/28/22 (!) 114/55   07/18/22 105/70   07/01/22 102/70        NPO Status: Time of last liquid consumption: 2300                        Time of last solid consumption: 2300                        Date of last liquid consumption: 07/27/22                        Date of last solid food consumption: 07/27/22    BMI:   Wt Readings from Last 3 Encounters:   07/28/22 (!) 162.5 kg (358 lb 4 oz)   07/18/22 (!) 162.8 kg (359 lb)   07/01/22 (!) 164.3 kg (362 lb 3.2 oz)     Body mass index is 65.52 kg/m.    CBC:   Lab Results   Component Value Date/Time    WBC 5.5 07/01/2022 10:01 AM    RBC 4.57 07/01/2022 10:01 AM    HGB 13.6 07/01/2022 10:01 AM    HCT 41.2 07/01/2022 10:01 AM    MCV 90.2 07/01/2022 10:01 AM    RDW 12.8 07/01/2022 10:01 AM    PLT 314 07/01/2022 10:01 AM       CMP:   Lab Results   Component Value Date/Time    NA 141 07/01/2022 10:01 AM    K 4.4 07/01/2022 10:01 AM    CL 111 07/01/2022 10:01 AM    CO2 29 07/01/2022 10:01 AM    BUN 15 07/01/2022 10:01 AM    CREATININE 0.64 07/01/2022 10:01 AM    GFRAA >60 09/16/2020 12:53 AM    AGRATIO 0.8 09/16/2020 12:53 AM    LABGLOM >60 07/01/2022 10:01 AM    GLUCOSE 98 07/01/2022 10:01 AM    PROT 6.6 07/01/2022 10:01 AM    CALCIUM 8.8 07/01/2022 10:01 AM    BILITOT 0.3 07/01/2022 10:01 AM    ALKPHOS 103 07/01/2022 10:01 AM    ALKPHOS 103 04/30/2022 11:09 AM    AST 10 07/01/2022 10:01 AM    ALT 24 07/01/2022 10:01 AM       POC Tests: No results for input(s): "POCGLU", "POCNA", "POCK", "POCCL", "POCBUN", "POCHEMO", "POCHCT" in the last 72 hours.    Coags: No results  found for: "PROTIME", "INR", "APTT"    HCG (If Applicable):   Lab Results   Component Value Date    PREGTESTUR Negative 07/28/2022        ABGs: No results found for: "PHART", "PO2ART", "PCO2ART", "HCO3ART", "BEART", "O2SATART"     Type & Screen (If Applicable):  No results found for: "LABABO", "LABRH"    Drug/Infectious Status (If Applicable):  Lab Results   Component Value Date/Time    HEPCAB NONREACTIVE 11/25/2020 03:25 PM       COVID-19 Screening (If Applicable):    Lab Results   Component Value Date/Time    COVID19 Detected 09/03/2020 11:41 AM           Anesthesia Evaluation  Patient summary reviewed and Nursing notes reviewed  Airway: Mallampati: II     Neck ROM: full  Mouth opening: > = 3 FB   Dental: normal exam         Pulmonary: breath sounds clear to auscultation  (+) pneumonia:  sleep apnea:                             Cardiovascular:  Exercise tolerance: poor (<4 METS),   (+) hyperlipidemia      ECG reviewed  Rhythm: regular  Rate: normal  Echocardiogram reviewed         Beta Blocker:  Not on Beta Blocker         Neuro/Psych:   Negative Neuro/Psych ROS              GI/Hepatic/Renal:   (+) GERD:, morbid obesity          Endo/Other:              Pt had PAT visit.       Abdominal:             Vascular: negative vascular ROS.         Other Findings:           Anesthesia Plan      general     ASA 3       Induction: intravenous.    MIPS: Prophylactic antiemetics administered.  Anesthetic plan and risks discussed with patient.      Plan discussed with CRNA.    Attending anesthesiologist reviewed and agrees with Preprocedure content                Whitman Hero, DO   07/28/2022

## 2022-07-28 NOTE — Other (Signed)
Called surgical waiting to update family- waiting for a room assigment

## 2022-07-28 NOTE — Other (Signed)
TRANSFER - OUT REPORT:    Verbal report given to [place], RN on Teresa Allen  being transferred to 523 for routine post-op       Report consisted of patient's Situation, Background, Assessment and   Recommendations(SBAR).     Time Pre op antibiotic given:0808  Anesthesia Stop time: 1751    Information from the following report(s) SBAR, OR Summary, Intake/Output, and MAR was reviewed with the receiving nurse.    Opportunity for questions and clarification was provided.     Is the patient on 02? No       L/Min -       Other -    Is the patient on a monitor? No    Is the nurse transporting with the patient? No    Surgical Waiting Area notified of patient's transfer from PACU? Yes

## 2022-07-28 NOTE — Other (Signed)
PATIENT INTERVIEWED IN PREOP.  NAME AND ALLERGY BAND VISIBLE AND CORRECT PER PATIENT.  PATIENT HAS UNDERSTANDING OF PROCEDURE AND SURGICAL SITE.  EDUCATIONAL NEEDS MET.  PATIENT STATES NO PAIN AT ATHIS TIME.

## 2022-07-28 NOTE — Other (Signed)
UPDATE WITH PATIENT'S SISTER (VERONICA) AT [phone].

## 2022-07-29 LAB — CBC WITH AUTO DIFFERENTIAL
Absolute Immature Granulocyte: 0.1 10*3/uL — ABNORMAL HIGH (ref 0.00–0.04)
Basophils %: 0 % (ref 0–1)
Basophils Absolute: 0 10*3/uL (ref 0.0–0.1)
Eosinophils %: 0 % (ref 0–7)
Eosinophils Absolute: 0 10*3/uL (ref 0.0–0.4)
Hematocrit: 41 % (ref 35.0–47.0)
Hemoglobin: 13.2 g/dL (ref 11.5–16.0)
Immature Granulocytes: 0 % (ref 0.0–0.5)
Lymphocytes %: 8 % — ABNORMAL LOW (ref 12–49)
Lymphocytes Absolute: 1 10*3/uL (ref 0.8–3.5)
MCH: 30 PG (ref 26.0–34.0)
MCHC: 32.2 g/dL (ref 30.0–36.5)
MCV: 93.2 FL (ref 80.0–99.0)
MPV: 9.5 FL (ref 8.9–12.9)
Monocytes %: 5 % (ref 5–13)
Monocytes Absolute: 0.6 10*3/uL (ref 0.0–1.0)
Neutrophils %: 87 % — ABNORMAL HIGH (ref 32–75)
Neutrophils Absolute: 10.7 10*3/uL — ABNORMAL HIGH (ref 1.8–8.0)
Nucleated RBCs: 0 PER 100 WBC
Platelets: 335 10*3/uL (ref 150–400)
RBC: 4.4 M/uL (ref 3.80–5.20)
RDW: 12.8 % (ref 11.5–14.5)
WBC: 12.3 10*3/uL — ABNORMAL HIGH (ref 3.6–11.0)
nRBC: 0 10*3/uL (ref 0.00–0.01)

## 2022-07-29 LAB — BASIC METABOLIC PANEL
Anion Gap: 6 mmol/L (ref 5–15)
BUN: 5 MG/DL — ABNORMAL LOW (ref 6–20)
Bun/Cre Ratio: 9 — ABNORMAL LOW (ref 12–20)
CO2: 22 mmol/L (ref 21–32)
Calcium: 8.2 MG/DL — ABNORMAL LOW (ref 8.5–10.1)
Chloride: 107 mmol/L (ref 97–108)
Creatinine: 0.54 MG/DL — ABNORMAL LOW (ref 0.55–1.02)
Est, Glom Filt Rate: >60 mL/min/{1.73_m2} (ref >60)
Glucose: 118 mg/dL — ABNORMAL HIGH (ref 65–100)
Potassium: 4 mmol/L (ref 3.5–5.1)
Sodium: 135 mmol/L — ABNORMAL LOW (ref 136–145)

## 2022-07-29 MED ORDER — HYDROMORPHONE HCL 2 MG PO TABS
2 MG | ORAL_TABLET | Freq: Four times a day (QID) | ORAL | 0 refills | Status: AC | PRN
Start: 2022-07-29 — End: 2022-08-03

## 2022-07-29 MED ORDER — DEXAMETHASONE SODIUM PHOSPHATE 4 MG/ML IJ SOLN
4 MG/ML | Freq: Once | INTRAMUSCULAR | Status: DC
Start: 2022-07-29 — End: 2022-07-29

## 2022-07-29 MED ORDER — LORAZEPAM 2 MG/ML IJ SOLN
2 MG/ML | Freq: Three times a day (TID) | INTRAMUSCULAR | Status: AC
Start: 2022-07-29 — End: 2022-07-31
  Administered 2022-07-29 – 2022-07-31 (×6): 0.5 mg via INTRAVENOUS

## 2022-07-29 MED FILL — METOCLOPRAMIDE HCL 5 MG/ML IJ SOLN: 5 MG/ML | INTRAMUSCULAR | Qty: 2

## 2022-07-29 MED FILL — POTASSIUM CHLORIDE IN NACL 20-0.45 MEQ/L-% IV SOLN: INTRAVENOUS | Qty: 1000

## 2022-07-29 MED FILL — PANTOPRAZOLE SODIUM 40 MG IV SOLR: 40 MG | INTRAVENOUS | Qty: 40

## 2022-07-29 MED FILL — LORAZEPAM 2 MG/ML IJ SOLN: 2 MG/ML | INTRAMUSCULAR | Qty: 1

## 2022-07-29 MED FILL — PROCHLORPERAZINE EDISYLATE 10 MG/2ML IJ SOLN: 10 MG/2ML | INTRAMUSCULAR | Qty: 2

## 2022-07-29 MED FILL — THIAMINE HCL 200 MG/2ML IJ SOLN: 200 MG/2ML | INTRAMUSCULAR | Qty: 1

## 2022-07-29 MED FILL — HYDROMORPHONE HCL 2 MG PO TABS: 2 MG | ORAL | Qty: 2

## 2022-07-29 MED FILL — GABAPENTIN 600 MG PO TABS: 600 MG | ORAL | Qty: 1

## 2022-07-29 MED FILL — CYANOCOBALAMIN 1000 MCG/ML IJ SOLN: 1000 MCG/ML | INTRAMUSCULAR | Qty: 1

## 2022-07-29 MED FILL — ENOXAPARIN SODIUM 40 MG/0.4ML IJ SOSY: 40 MG/0.4ML | INTRAMUSCULAR | Qty: 0.4

## 2022-07-29 MED FILL — ONDANSETRON HCL 4 MG/2ML IJ SOLN: 4 MG/2ML | INTRAMUSCULAR | Qty: 2

## 2022-07-29 MED FILL — HYDROMORPHONE HCL 1 MG/ML IJ SOLN: 1 MG/ML | INTRAMUSCULAR | Qty: 1

## 2022-07-29 NOTE — Progress Notes (Signed)
Pt continues to have nausea with some emesis, especially when she is up moving about. Pt sat up in chair for long period of time today but declined when PCT attempted to get her to ambulate in halls D?T nausea and vomiting

## 2022-07-29 NOTE — Consults (Signed)
Nutrition Education    Educated on Bariatric post-op diet.  Learners: Patient  Readiness: Acceptance  Method: Explanation  Response: Verbalizes Understanding  Contact name and number provided.    Lemmie Steinhaus, RD  Contact via PerfectServe

## 2022-07-29 NOTE — Progress Notes (Signed)
Surgery Progress Note    07/29/2022    Admit Date: 07/28/2022  6:01 AM    CC: Nausea    POD: 1 SADI    Subjective:     Nausea and emesis last night. Pain controlled.    Constitutional: No fever or chills  Neurologic: No headache  Eyes: No scleral icterus or irritated eyes  Nose: No nasal pain or drainage  Mouth: No oral lesions or sore throat  Cardiac: No palpations or chest pain  Pulmonary: No cough or shortness of breath  Gastrointestinal: Abd pain, nausea, emesis, no diarrhea, or constipation  Genitourinary: No dysuria  Musculoskeletal: No muscle or joint tenderness  Skin: No rashes or lesions  Psychiatric: No anxiety or depressed mood    Objective:     Vitals:    07/29/22 0552   BP:    Pulse: 63   Resp:    Temp:    SpO2:        General: No acute distress, conversant  Eyes: PERRLA, no scleral icterus  HENT: Normocephalic without oral lesions  Neck: Trachea midline without LAD  Cardiac: Normal pulse rate and rhythm  Pulmonary: Symmetric chest rise with normal effort  GI: Soft, ATTP  Skin: Warm without rash  Extremities: No edema or joint stiffness  Psych: Appropriate mood and affect    Labs, vital signs, and I/O reviewed.    Assessment:     46 y/o F s/p SADI with post op nausea    Plan:     PRN pain meds  IVF  Schedule ativan and reglan  Bari fulls. PPI  Lovenox. Hgb stable  Cont floor    Lamount Cranker, MD, FACS, [facility]  Bariatric and General Surgeon  [facility]

## 2022-07-29 NOTE — Discharge Instructions (Signed)
Designer, jewellery at [facility]  Bariatric Surgery Discharge Instructions     Procedure SADI    Future Appointments   Date Time Provider [place]   08/11/2022  8:00 AM Loney Hering, APRN - NP [facility] BS AMB   08/25/2022  7:40 AM Loney Hering, APRN - NP [place] BS AMB   09/07/2022  1:45 PM Lamount Cranker, MD [place] BS AMB   10/21/2022  3:40 PM Doloresco, Elta Guadeloupe, MD [place] BS AMB         Contact Information:    [facility] at [address], [place] [address]  [phone]    After Hours and Weekends  [phone] On Call Surgeon    Non Emergent Medical Needs  Call during office hours or send a message via My Chart   (messages returned during business hours)    DIET    Please remember that you are on [place] for the first 2 weeks after surgery.     Do not advance to the next phase until advised by your surgeon or Nurse Practitioner.   Refer to the Bariatric Handbook for detailed information.     TO PREVENT DEHYDRATION:  consume 64 ounces of liquids daily.  At least 64 ounces of that should come from water, Crystal Light, sugar free popsicles, sugar free gelatin or other calorie-free, sugar-free, caffeine free and noncarbonated beverages. Do not drink with a straw.   Sip, sip, sip throughout the day  Main priority is to stay hydrated  Aim for 60 grams of protein every day.  Most of your protein will come from shakes.  Refer to the Bariatric Handbook for detailed information.  Add additional protein supplements to meet protein needs (protein powder, clear protein such as protein water, non-fat dry milk powder, NO protein bars at this at this stage)     MEDICATIONS & VITAMINS    Pre-surgery medications should be reviewed with your Bariatric provider and taken as prescribed   Take no more than 2 pills at a time and wait 15-20 minutes between pills       Pain Medication  The first few days home, you may require  narcotic pain medication to manage your pain.  Take this medication only as prescribed.  If your pain is mild to moderate, try taking Acetaminophen (Tylenol) 500 mg 1-2 tablets every 8 hours or as directed by your provider.  Avoid taking antiinflammatory medications (NSAID'S) such as Ibuprofen (Motrin, Advil) or Naproxen (Aleve).  These medications can be harmful to your stomach and cause bleeding and ulcers.  There is a complete list of NSAID medications to AVOID in your handbook.  Abdominal support (Spanx or body shaper) and heat (heating pad on low setting) are very helpful in managing pain after surgery.       Acid Reducing ("heartburn/reflux") Medication   Acid reducing medicine should have been prescribed at your pre-surgery visit.  It is recommended you take this medication every day even if you have no symptoms of reflux or heartburn.  If you were previously on a medication for reflux/heartburn you should continue the medication daily.      *It is common to experience reflux or heartburn after bariatric surgery.  These symptoms can usually be managed with medication, diet and behavior changes.  In most cases symptoms improve or resolve after a few weeks to a couple of months.  Nausea Medication  You should have been prescribed medication for nausea at your pre-surgery visit.  If you are experiencing nausea, please take the medication as prescribed to try and get relief.  If the nausea medication is not effective, please call your surgeon's office.      Constipation   Constipation can be caused by pain medication and reduced food and water intake.  Drink at least 64 oz. fluid.  OK to use OTC medications such as Milk of Magnesia or Miralax      Vitamins      Okay to start immediately when returning home.    Calcium Citrate with Vitamin D-3 - Take 1200--1500 mg  each day.  Divide doses throughout the day.  Do not take more than 600 mg at one time.   Take at least 2 hours before or after your multivitamin  and/or iron supplement.  Multivitamin containing Iron - 2 multivitamins with 100% Daily Value of Iron, Folic Acid and Thiamine   Vitamin D-3 - Take 3000 IU  per day  Vitamin B-12 - Oral or Sublingual: 350-500 mcg/day OR 1000 mcg Monthly intramuscular shot        ACTIVITY    Be active.  Sit up as much as possible.  Walk often.  Walking and/or foot exercises will help prevent blood clots.  Continue to sip liquids throughout the day  Continue to use your incentive spirometer 4 to 5 times per day  Continue using your CPAP if previous prescribed.  Keep your incisions clean and dry to prevent infection.    Showering is ok.  No submersion in water for 2 weeks (No tubs, pools, etc.)  Weight lifting restrictions:  10 lbs. for the first 2 weeks, 20 lbs. for the next 4 to 6 weeks    TOP REASONS TO CONTACT YOUR SURGEONS'S OFFICE    You have severe pain or discomfort unrelieved by pain medication.  You have been vomiting for more than 24 hours.  Call sooner if you are unable to drink any fluids.  Temperature rises above 101.5 degrees.  You have persistent nausea and/or vomiting.  You are unable to swallow liquids   Increased swelling, redness, or drainage from your incision sites.

## 2022-07-29 NOTE — Care Coordination-Inpatient (Signed)
Care Management Initial Assessment       RUR: 8% Low   Readmission? No  1st IM letter given? NA  1st [employer] letter given: NA     07/29/22 1544   Service Assessment   Patient Orientation Alert and Oriented;Person;Place;Situation;Self   Cognition Alert   History Provided By Patient   Primary Caregiver Self   Accompanied By/Relationship [place] Family Members   Patient's Healthcare Decision Maker is: Legal Next of [place]   PCP Verified by CM Yes   Last Visit to PCP Within last year   Current Functional Level Independent in ADLs/IADLs   Can patient return to prior living arrangement Yes   Ability to make needs known: Good   Family able to assist with home care needs: Yes   Would you like for me to discuss the discharge plan with any other family members/significant others, and if so, who? Yes  (Upon patient request)   Financial Resources Medicaid   Community Resources None   Discharge Planning   Type of Residence House   Patient expects to be discharged to: [place], [facility]   [phone]

## 2022-07-30 MED ORDER — DEXAMETHASONE SODIUM PHOSPHATE 4 MG/ML IJ SOLN
4 MG/ML | INTRAMUSCULAR | Status: AC
Start: 2022-07-30 — End: 2022-07-30

## 2022-07-30 MED ORDER — DEXAMETHASONE SODIUM PHOSPHATE 4 MG/ML IJ SOLN
4 MG/ML | Freq: Once | INTRAMUSCULAR | Status: AC
Start: 2022-07-30 — End: 2022-07-29
  Administered 2022-07-30: 03:00:00 8 mg via INTRAVENOUS

## 2022-07-30 MED FILL — METOCLOPRAMIDE HCL 5 MG/ML IJ SOLN: 5 MG/ML | INTRAMUSCULAR | Qty: 2

## 2022-07-30 MED FILL — PANTOPRAZOLE SODIUM 40 MG IV SOLR: 40 MG | INTRAVENOUS | Qty: 40

## 2022-07-30 MED FILL — DEXAMETHASONE SODIUM PHOSPHATE 4 MG/ML IJ SOLN: 4 MG/ML | INTRAMUSCULAR | Qty: 1

## 2022-07-30 MED FILL — LORAZEPAM 2 MG/ML IJ SOLN: 2 MG/ML | INTRAMUSCULAR | Qty: 1

## 2022-07-30 MED FILL — POTASSIUM CHLORIDE IN NACL 20-0.45 MEQ/L-% IV SOLN: INTRAVENOUS | Qty: 1000

## 2022-07-30 MED FILL — HYDROMORPHONE HCL 2 MG PO TABS: 2 MG | ORAL | Qty: 1

## 2022-07-30 MED FILL — ENOXAPARIN SODIUM 40 MG/0.4ML IJ SOSY: 40 MG/0.4ML | INTRAMUSCULAR | Qty: 0.4

## 2022-07-30 MED FILL — ACETAMINOPHEN 650 MG/20.3ML PO SOLN: 650 MG/20.3ML | ORAL | Qty: 40.6

## 2022-07-30 MED FILL — ONDANSETRON 4 MG PO TBDP: 4 MG | ORAL | Qty: 1

## 2022-07-30 MED FILL — GABAPENTIN 600 MG PO TABS: 600 MG | ORAL | Qty: 1

## 2022-07-30 NOTE — Progress Notes (Signed)
Progress Note  Date:07/30/2022       Room:523/02  Patient Name:Teresa Allen     Date of Birth:02/16/1976     Age:46 y.o.        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  Still having some nausea but better and has not thrown up.  Objective         Vitals Last 24 Hours:  TEMPERATURE:  Temp  Avg: 98.6 F (37 C)  Min: 98.1 F (36.7 C)  Max: 98.8 F (37.1 C)  RESPIRATIONS RANGE: Resp  Avg: 18.4  Min: 18  Max: 20  PULSE OXIMETRY RANGE: SpO2  Avg: 94.8 %  Min: 91 %  Max: 99 %  PULSE RANGE: Pulse  Avg: 61.3  Min: 54  Max: 76  BLOOD PRESSURE RANGE: Systolic (60YTK), ZSW:109 , Min:102 , NAT:557   ; Diastolic (32KGU), RKY:70, Min:60, Max:73    I/O (24Hr):    Intake/Output Summary (Last 24 hours) at 07/30/2022 1140  Last data filed at 07/29/2022 1953  Gross per 24 hour   Intake 0 ml   Output 1300 ml   Net -1300 ml     Objective:  Vital signs: (most recent): Blood pressure 128/73, pulse 54, temperature 98.1 F (36.7 C), temperature source Oral, resp. rate 18, height 1.575 m (5\' 2" ), weight (!) 162.5 kg (358 lb 4 oz), SpO2 96 %.    General alert no acute distress  Lungs clear  Heart regular rate and rhythm  Abdomen soft appropriately tender incisions healing well  Labs/Imaging/Diagnostics    Labs:  CBC:  Recent Labs     07/29/22  0325   WBC 12.3*   RBC 4.40   HGB 13.2   HCT 41.0   MCV 93.2   RDW 12.8   PLT 335     CHEMISTRIES:  Recent Labs     07/29/22  0325   NA 135*   K 4.0   CL 107   CO2 22   BUN 5*   CREATININE 0.54*   GLUCOSE 118*     PT/INR:No results for input(s): "PROTIME", "INR" in the last 72 hours.  APTT:No results for input(s): "APTT" in the last 72 hours.  LIVER PROFILE:No results for input(s): "AST", "ALT", "BILIDIR", "BILITOT", "ALKPHOS" in the last 72 hours.    Imaging Last 24 Hours:  No results found.  Assessment//Plan           Hospital Problems             Last Modified POA    * (Principal) Morbid obesity ([place])  07/28/2022 Yes     Assessment & Plan  Status post SADI  Not yet at goal for drinking.  She is improving but not at a point where she will be able to be discharged home.  Continue to encourage drinking of cups.  Out of bed and ambulate  Perhaps home tomorrow  Electronically signed by Merrily Brittle, MD on 07/30/22 at 11:40 AM EDT

## 2022-07-31 MED ORDER — SODIUM CHLORIDE 0.9 % IV BOLUS
0.9 % | Freq: Once | INTRAVENOUS | Status: AC
Start: 2022-07-31 — End: 2022-07-31
  Administered 2022-07-31: 08:00:00 1000 mL via INTRAVENOUS

## 2022-07-31 MED FILL — GABAPENTIN 600 MG PO TABS: 600 MG | ORAL | Qty: 1

## 2022-07-31 MED FILL — ENOXAPARIN SODIUM 40 MG/0.4ML IJ SOSY: 40 MG/0.4ML | INTRAMUSCULAR | Qty: 0.4

## 2022-07-31 MED FILL — ONDANSETRON HCL 4 MG/2ML IJ SOLN: 4 MG/2ML | INTRAMUSCULAR | Qty: 2

## 2022-07-31 NOTE — Discharge Summary (Signed)
Physician Discharge Summary     Patient ID:  Teresa Allen  [id number]  46 y.o.  Oct 21, 1975    Admit date: 07/28/2022    Discharge date and time: 07/31/2022  2:33 PM     Admitting Physician: Lamount Cranker, MD     Discharge Physician: Same    Admission Diagnoses: Morbid obesity ([place]) [E66.01]    Discharge Diagnoses: Same    Admission Condition: good    Discharged Condition: good    Indication for Admission: Obesity    Hospital Course: 46 y/o F with obesity was admitted for the SADI procedure. This went well. POD 1 she had severe nausea. This improved on POD 2. By POD 3 her nausea was controlled and she was tolerating diet with pain controlled and discharged home.      Disposition: home    In process/preliminary results:  Outstanding Order Results       No orders found from 06/29/2022 to 07/29/2022.            Patient Instructions:   Discharge Medication List as of 07/31/2022 11:02 AM        START taking these medications    Details   HYDROmorphone (DILAUDID) 2 MG tablet Take 1 tablet by mouth every 6 hours as needed for Pain for up to 5 days. Max Daily Amount: 8 mg, Disp-18 tablet, R-0Normal           CONTINUE these medications which have NOT CHANGED    Details   polyethylene glycol (GLYCOLAX) 17 GM/SCOOP powder Take 17 g by mouth daily, Disp-1530 g, R-0Normal      ondansetron (ZOFRAN) 4 MG tablet Take 1 tablet by mouth every 8 hours as needed for Nausea or Vomiting, Disp-30 tablet, R-0Normal      gabapentin (NEURONTIN) 100 MG capsule Take 1-2 capsules by mouth 3 times daily for 5 days. Max Daily Amount: 600 mg, Disp-30 capsule, R-0Normal      omeprazole (PRILOSEC) 40 MG delayed release capsule Take 1 capsule by mouth every morning (before breakfast), Disp-90 capsule, R-1Normal      Hyoscyamine Sulfate SL (LEVSIN/SL) 0.125 MG SUBL Place 0.125 mg under the tongue every 6 hours as needed (cramping), Disp-30 each, R-0Normal      vitamin D (ERGOCALCIFEROL) 1.25 MG (50000 UT) CAPS capsule Take 1 capsule by mouth once a  week, Disp-12 capsule, R-0Normal      enoxaparin (LOVENOX) 40 MG/0.4ML Inject 0.4 mLs into the skin daily for 14 days, Disp-5.6 mL, R-0Normal      Multiple Vitamins-Minerals (MULTIVITAL PO) Take by mouthHistorical Med      calcium carb-cholecalciferol (CALCIUM 600+D) 600-10 MG-MCG TABS per tab Take 1 tablet by mouth dailyHistorical Med      rosuvastatin (CRESTOR) 10 MG tablet Take 1 tablet by mouth nightlyHistorical Med           Activity: activity as tolerated  Diet:  Bari fulls  Wound Care: keep wound clean and dry    Follow-up with NP in two weeks    Signed:  Lamount Cranker, MD, FACS, [facility]  Bariatric and General Surgeon  [facility]    08/01/2022  7:31 AM

## 2022-07-31 NOTE — Significant Event (Signed)
Patient's blood pressure dropped to 90/39, asymptomatic.   Dr. Claudie Leach was informed. NS 1L bolus was ordered and started. Monitored patient.

## 2022-07-31 NOTE — Progress Notes (Signed)
Progress Note  Date:07/31/2022       Room:523/02  Patient Name:Teresa Allen     Date of Birth:Mar 13, 1976     Age:46 y.o.        Subjective    Subjective   Review of Systems  Patient with a small amount of nausea.  Threw up some chills yesterday but none today.  Has been drinking her cups.  Has been up and ambulating.  Objective         Vitals Last 24 Hours:  TEMPERATURE:  Temp  Avg: 98.7 F (37.1 C)  Min: 98.6 F (37 C)  Max: 98.8 F (37.1 C)  RESPIRATIONS RANGE: Resp  Avg: 18  Min: 18  Max: 18  PULSE OXIMETRY RANGE: SpO2  Avg: 96.8 %  Min: 93 %  Max: 99 %  PULSE RANGE: Pulse  Avg: 65.6  Min: 55  Max: 77  BLOOD PRESSURE RANGE: Systolic (62GBT), DVV:616 , Min:90 , WVP:710   ; Diastolic (62IRS), WNI:62, Min:39, Max:57    I/O (24Hr):    Intake/Output Summary (Last 24 hours) at 07/31/2022 1059  Last data filed at 07/30/2022 1931  Gross per 24 hour   Intake 780 ml   Output --   Net 780 ml     Objective:  Vital signs: (most recent): Blood pressure (!) 126/53, pulse 68, temperature 98.8 F (37.1 C), temperature source Oral, resp. rate 18, height 1.575 m (5\' 2" ), weight (!) 162.5 kg (358 lb 4 oz), SpO2 99 %.    General alert no acute distress  Abdomen soft nontender nondistended  Incisions healing well without evidence of infection  Labs/Imaging/Diagnostics    Labs:  CBC:  Recent Labs     07/29/22  0325   WBC 12.3*   RBC 4.40   HGB 13.2   HCT 41.0   MCV 93.2   RDW 12.8   PLT 335     CHEMISTRIES:  Recent Labs     07/29/22  0325   NA 135*   K 4.0   CL 107   CO2 22   BUN 5*   CREATININE 0.54*   GLUCOSE 118*     PT/INR:No results for input(s): "PROTIME", "INR" in the last 72 hours.  APTT:No results for input(s): "APTT" in the last 72 hours.  LIVER PROFILE:No results for input(s): "AST", "ALT", "BILIDIR", "BILITOT", "ALKPHOS" in the last 72 hours.    Imaging Last 24 Hours:  No results found.  Assessment//Plan           Hospital Problems             Last Modified POA    * (Principal) Morbid obesity ([place]) 07/28/2022 Yes      Assessment & Plan  Status post SADI  Doing much better today.  Will discharge home  Electronically signed by Merrily Brittle, MD on 07/31/22 at 10:59 AM EDT

## 2022-07-31 NOTE — Progress Notes (Signed)
Discharge medications reviewed with the patient and appropriate educational materials and side effects teaching were provided. Discharge education provided, patient able to verbalize understanding. PIV removed.

## 2022-08-01 NOTE — Telephone Encounter (Signed)
Bariatric Post Op Call 48 hour    Hydration: Less than 32 ounces of water daily is fair to poor (Goal is 64 ounces per day)  Poor []  Fair [x]   Good []  Great []     Amount: Reports getting in 30 to 35 ounces of clear liquids along with 8 ounces each day of soup.     Comment: Patient was discharged yesterday. Today 08/01/22 so far she has reported consuming about 28 ounces of water.     Ambulation:( walking throughout the day, at least every 2 hours while awake. Patient should be up and out of bed most of the day.)   Poor []  Fair []   Good []  Great [x]     Comment:    Urine Color: Question of any odor and color (should be amber, pale, and clear)   Dark []  Amber []  Pale [x]  Clear []     Comment: No odor    Diet: Question any nausea and/or vomiting.            Protein intake (ultimately goal is 60 grams of protein daily, but at 2 days post op they should be working towards this and may not be at goal yet)  Poor []  Fair [x]   Good []  Great []     Comment: Nausea noted in the am, no vomiting. Reported consuming 1/2 shake so far today. Reported today 08/02/22 consuming at least 1 shake each day.       Bowel movements: Question of any constipation- haven't had any bowel movements for more than 3 days. This could be related to protein intake and/or narcotic pain medication usage.     Comment: BM noted         Use of incentive spirometer:  Yes [x]   No []     Comment:     Incision: (No redness, pain, swelling or fever)     Healing Well [x]  Redness []  Pain []  Drainage []  Swelling []     Comment: No Fever    Pain: Right sided incisional (usually the largest incision with deep stitch) abdominal pain is normal (should be less than 3).  Pain 0 - 10: 3    Comment (are they taking pain medication and is it helping? Abdominal support / splinting/ ice or heat?): Gabapentin at night, Tylenol throughout the day.     Referred to provider: NP     Next appointment: 08/11/22      Red Flags = prompt referral to a bariatric team provider for follow  up    Fever > 101  Vomiting and not tolerating liquids   Weak and dizzy / lightheaded   Dark urine   Abdominal pain despite medication, splinting, ice or heat   SOB  Calf swelling and or redness   Chest pain   Additional Comments: Patient was discharged yesterday. Will call tomorrow for better results. Today 08/02/22 patient reports difficulty differentiating between feeling full, gas or hungry. ____________________________________________________________    If more than one parameter is not met or considered poor, nurse needs to discuss with provider recommend for patient to be seen in the office as soon as possible or refer to the provider for follow-up.  Reinforce to patient to use bariatric educational booklet as guide.  It is appropriate to refer patient to the nutritionist to discuss more in detail of diet and nutrition.

## 2022-08-11 ENCOUNTER — Ambulatory Visit
Admit: 2022-08-11 | Discharge: 2022-08-11 | Payer: PRIVATE HEALTH INSURANCE | Attending: Nurse Practitioner | Primary: Family

## 2022-08-11 NOTE — Telephone Encounter (Signed)
Patient requesting a return to work letter.

## 2022-08-11 NOTE — Progress Notes (Signed)
Identified patient with two patient identifiers (name and DOB). Reviewed chart in preparation for visit and have obtained necessary documentation.    Teresa Allen is a 46 y.o. 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 Have you been to the ER, urgent care clinic since your last visit?  Hospitalized since your last visit?no    2. Have you seen or consulted any other health care providers outside of the [facility] since your last visit?  Include any pap smears or colon screening. no

## 2022-08-11 NOTE — Patient Instructions (Signed)
What you need to know:  1.   Advance your diet to soft foods.  Follow the handout that you were given today in the office.   2.  Take the recommended vitamins daily  3.  No lifting greater than 20 lbs.   4.  You can do light jogging and walking.   5  Follow up in 2 weeks.  6.  You may go into a pool.   7.  If you are not able to tolerate liquids or soft foods.   Please call our office. [phone]  8.  If you have vomiting and persistent epigastric pain or chest pain. You should call our office, the doctor on-call or go to the emergency room.     Constipation  Benefiber, Miralax & similar (once or twice daily)  Milk of Magnesia (daily as needed)  Dulcolax suppository  Fleets Enema    Soft and Mushy   What is this diet?  Introduces soft, easy to digest foods  Low fat, no sugar added    When do I begin?  Once instructed by your surgeon or NP. Usually 2 -3 weeks after surgery      You will stay on this diet until instructed to start the next phase.     What foods can I eat?  Moist, mushy foods (see approved list of foods)       Key Points  Continue to drink 48-64 ounces of low calorie, non-carbonated, sugar free beverages between meals.   Eat 3 meals per day  Measure each meal to ?cup per meal  Aim for 60 grams of protein every day.  Try food sources of protein first.   Continue to supplement with protein shakes/powder to meet protein goals.  Take small bites.  Try eating with smaller utensils (baby spoon, cocktail fork).  Chew food thoroughly  Allow about 30 minutes to eat a meal.  Eating too fast may cause nausea or vomiting.  Stop eating as soon as you feel full. Overeating may stretch your stomach's capacity and prevent desired weight loss.  Do not drink liquids during meals and 30 minutes after meals.  Drinking with meals may cause nausea or vomiting.  Add one new food at a time  Take vitamins daily                     Shopping Lists    Soft and Mushy  In addition to everything on the Bariatric Liquid diet, you may  add these foods to your diet.  Protein - include with every meal  Egg or egg substitute    Low fat or fat-free cottage cheese    Low fat or fat-free yogurt   Low fat Greek yogurt   Fat-free, 1% milk, or Lactaid milk   Low-fat or vegetarian refried beans   Well-cooked beans and lentils  Fat-free or 2% reduced-fat cheese   Hummus   Low fat soup     Snacks/Other Options:  Whole wheat crackers  Sugar free fudgsicles   Sugar free cocoa   No sugar added pudding         Fruits and Vegetables  Applesauce (no sugar added)  Canned fruit (no sugar added)  Fresh soft peeled fruits (melons, banana, avocado, berries)  Any soft cooked vegetables   Mashed potatoes, Sweet potatoes, baked potatoes (no skin)  Condiments  Fat free non-stick spray  Herbs and spices  Lite butter, margarine, canola oil, olive oil  Reduced-fat or fat-free mayo    Reduced-fat or fat-free salad dressing  Reduced-fat or fat-free cream cheese  Reduced-fat or fat-free sour cream  Lemon juice  Salt, pepper, mustard, ketchup, salsa    Prepare food to the appropriate texture.    Sample Meal Plan:  Soft and Mushy    Breakfast  cup plain oatmeal with protein powder. Add cinnamon, nutmeg, Splenda Usiel Astarita sugar as desired for flavor 20-25 grams protein   Snack (optional) High protein gelatin (recipe on www.unjury.com) 10 grams protein   Lunch  cup low fat cottage cheese or Greek yogurt with soft fruit 10 - 15 grams protein    Snack (optional) High protein pudding or high protein popsicle (recipe on www.unjury.com) 10 grams protein   Dinner  cup low-fat well cooked beans with low-fat cheese sprinkled on top   cup no sugar added applesauce (can sprinkle protein powder) 5-8 grams protein  5-10  grams protein

## 2022-08-11 NOTE — Progress Notes (Signed)
2 weeks status post SADI  Pt reports doing well on liquids .    No complaints of pain.   Pt reports no nausea and no vomiting  Sheis drinking approximately 30 oz of water daily.   She is drinking and eating 50+ grams of protein daily.       She is taking all bariatric vitamins without issue.   Taking Bariatric pal SADI vitamins.   Total weight loss since surgery 31lbs  Weight loss since last visit 31lbs    BP 102/60 (Site: Right Upper Arm, Position: Sitting, Cuff Size: Large Adult)   Pulse 80   Temp 98.9 F (37.2 C) (Oral)   Resp 18   Ht 1.575 m (5\' 2" )   Wt (!) 155.9 kg (343 lb 9.6 oz)   SpO2 95%   BMI 62.85 kg/m      \      Teresa Allen has a reminder for a "due or due soon" health maintenance. I have asked that she contact her primary care provider for follow-up on this health maintenance.      Physical Examination: General appearance - alert, well appearing, and in no distress,  Chest - clear to auscultation bilaterally  Heart - normal rate, regular rhythm, normal S1, S2, no murmurs, rubs, clicks or gallops  Abdomen - soft, nontender, nondistended  scars from previous incisions healing without erythema or induration    A/P    Doing well 2 weeks status post SADI.   Diet advanced to soft foods.   Focus on 50-60 grams of protein daily.   Supplement with unflavored protein powder daily.   Encouraged water intake to at least 64 oz of non-carbonated/no calorie beverages.  Continue vitamins   Continue PPI  No lifting greater than 20 lbs  Follow up 2 weeks  RTW  08/15/2022  Path 1. Gastric fundus, sleeve gastrectomy:        No pathologic abnormality.     2. 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   Pt verbalized understanding and questions were answered to the best of my knowledge and ability.    diet educational materials were provided.      Dineen Kid, APRN - NP

## 2022-08-12 NOTE — Telephone Encounter (Signed)
Returned call to patient.  Two patient identifiers used. Made patient aware letter is completed and I will have letter scanned into her mychart. Patient verbalized understanding and thanked for call.

## 2022-08-18 NOTE — Telephone Encounter (Signed)
Bariatric Post Op Call: Week 3     Hydration: Less than 32 ounces of water daily is fair to poor (Goal is 64 ounces per day)  Poor _0  Fair _1   Good _2  Great _3     Amount: 30 ounces     Comment: Patient reported struggling. Patient complains of feeling full with just 1 ounce of water. Patient complained of feeling dehydrated.     Ambulation: walking at least 3x/ week for 30 minutes   Poor _4  Fair _5   Good _6  Great _7     Comment:    Urine Color: Question of any odor and color (should be amber, pale, and clear)   Dark _8  Amber _9  Pale _10  Clear _11     Comment: Reported urine yellow in color.     Diet: Question any nausea and/or vomiting.   Protein intake (ultimately goal is 60 grams of protein daily and at this stage they should be meeting goal).  Poor _12  Fair _13   Good _14  Great _15     Comment: Intermittent nausea noted when eating some solids. NP had requested that she back down the shake consumption to consume more water. She reports ensuring that she is eating more protein.       Bowel movements: Question of any constipation- haven't had any bowel movements for more than 3 days. This could be related to protein, fluid intake, medications and activity.     Comment: Regular BM's noted.     Incision: (No redness, pain, swelling or fever)     Healing Well _16  Redness _17  Pain _18  Drainage _19  Swelling _20     Comment: No Fever    Pain: Right sided incisional (usually the largest incision with deep stitch) abdominal pain is normal (should be less than 3). This should be better by 3 weeks post op.   Pain 0 - 10: 0    Comment (are they taking pain medication and is it helping? Abdominal support / splinting/ ice or heat?): Not taking anything for pain.      Referred to provider: NP  Next Appointment:  08/25/22   Support Group: 2nd Thursday every month from 6-7 pm on Zoom     Red Flags = prompt referral to a bariatric team provider for follow up    Fever > 101  Vomiting and not tolerating liquids   Weak and dizzy / lightheaded    Dark urine   Abdominal pain despite medication, splinting, ice or heat   SOB  Calf swelling and or redness   Chest pain   ____________________________________________________________    If more than one parameter is not met or considered poor, nurse needs to discuss with provider recommend for patient to be seen in the office as soon as possible or refer to the provider for follow-up.  Reinforce to patient to use bariatric educational booklet as guide.  It is appropriate to refer patient to the nutritionist to discuss more in detail of diet and nutrition.

## 2022-08-19 ENCOUNTER — Ambulatory Visit: Payer: PRIVATE HEALTH INSURANCE | Primary: Family

## 2022-08-19 ENCOUNTER — Ambulatory Visit
Admit: 2022-08-19 | Discharge: 2022-08-19 | Payer: PRIVATE HEALTH INSURANCE | Attending: Nurse Practitioner | Primary: Family

## 2022-08-19 DIAGNOSIS — E860 Dehydration: Secondary | ICD-10-CM

## 2022-08-19 MED ORDER — SODIUM CHLORIDE 0.9 % IV SOLN
0.9 % | INTRAVENOUS | Status: DC | PRN
Start: 2022-08-19 — End: 2022-08-20

## 2022-08-19 MED ORDER — ONDANSETRON HCL 4 MG/2ML IJ SOLN
4 MG/2ML | Freq: Once | INTRAMUSCULAR | Status: AC
Start: 2022-08-19 — End: 2022-08-19
  Administered 2022-08-19: 15:00:00 4 mg via INTRAVENOUS

## 2022-08-19 MED ORDER — HEPARIN NA (PORK) LOCK FLSH PF 100 UNIT/ML IV SOLN
100 UNIT/ML | INTRAVENOUS | Status: DC | PRN
Start: 2022-08-19 — End: 2022-08-20

## 2022-08-19 MED ORDER — NORMAL SALINE FLUSH 0.9 % IV SOLN
0.9 % | INTRAVENOUS | Status: DC | PRN
Start: 2022-08-19 — End: 2022-08-20

## 2022-08-19 MED ORDER — SODIUM CHLORIDE (PF) 0.9 % IJ SOLN
0.9 % | Freq: Once | INTRAMUSCULAR | Status: AC
Start: 2022-08-19 — End: 2022-08-19
  Administered 2022-08-19: 15:00:00 40 mg via INTRAVENOUS

## 2022-08-19 MED ORDER — SODIUM CHLORIDE 0.9 % IV SOLN
0.9 % | Freq: Once | INTRAVENOUS | Status: AC
Start: 2022-08-19 — End: 2022-08-19
  Administered 2022-08-19: 16:00:00 via INTRAVENOUS

## 2022-08-19 MED ORDER — LACTATED RINGERS IV BOLUS
Freq: Once | INTRAVENOUS | Status: AC
Start: 2022-08-19 — End: 2022-08-19
  Administered 2022-08-19: 15:00:00 1000 mL via INTRAVENOUS

## 2022-08-19 MED ORDER — STERILE WATER FOR INJECTION (MIXTURES ONLY)
2 MG | INTRAMUSCULAR | Status: DC | PRN
Start: 2022-08-19 — End: 2022-08-20

## 2022-08-19 MED FILL — FOLIC ACID 5 MG/ML IJ SOLN: 5 MG/ML | INTRAMUSCULAR | Qty: 0.2

## 2022-08-19 MED FILL — LACTATED RINGERS IV SOLN: INTRAVENOUS | Qty: 1000

## 2022-08-19 MED FILL — PANTOPRAZOLE SODIUM 40 MG IV SOLR: 40 MG | INTRAVENOUS | Qty: 40

## 2022-08-19 MED FILL — ONDANSETRON HCL 4 MG/2ML IJ SOLN: 4 MG/2ML | INTRAMUSCULAR | Qty: 2

## 2022-08-19 NOTE — Progress Notes (Signed)
Identified patient with two patient identifiers (name and DOB). Reviewed chart in preparation for visit and have obtained necessary documentation.    Teresa Allen is a 46 y.o. female  Chief Complaint   Patient presents with    Post-Op Check     3 week s/p ROBOTIC SINGLE ANATOMOSIS DUODENAL ILEOSTOMY, ROBOTIC SLEEVE GASTRECTOMY (E R A S), RESECTION OF MEKEALS DIVERTICULUM     BP 96/62 (Site: Right Lower Arm, Position: Sitting, Cuff Size: Medium Adult)   Pulse 82   Temp 98 F (36.7 C) (Oral)   Resp 16   Ht 1.575 m (5\' 2" )   Wt (!) 155 kg (341 lb 12.8 oz)   SpO2 97%   BMI 62.52 kg/m     1. Have you been to the ER, urgent care clinic since your last visit?  Hospitalized since your last visit?no    2. Have you seen or consulted any other health care providers outside of the [facility] since your last visit?  Include any pap smears or colon screening. no

## 2022-08-19 NOTE — Progress Notes (Signed)
[facility] Peds/Adult Note                   Date: August 19, 2022    Name: Teresa Allen    MRN: [id number]       DOB: 1976-06-09    1000 Patient arrives for IV Hydration without acute problems. Please see Epic for complete assessment and education provided.    Vital signs stable throughout and prior to discharge. Patient tolerated procedure well and was discharged without incident.  Patient is aware of no further [facility] appointments and will follow up with their healthcare provider.     Ms. Suchocki vitals were reviewed prior to and after treatment.   Patient Vitals for the past 12 hrs:   Temp Pulse Resp BP   08/19/22 1200 -- -- -- (!) 89/52   08/19/22 1158 -- 63 18 (!) 81/62   08/19/22 0943 97.8 F (36.6 C) 72 18 90/65     Medications given:   Medications Administered         lactated ringers bolus bolus 1,000 mL Admin Date  08/19/2022 Action  New Bag Dose  1,000 mL Rate  1,000 mL/hr Route  IntraVENous Administered By  Jackqulyn Livings, RN        ondansetron [facility]) injection 4 mg Admin Date  08/19/2022 Action  Given Dose  4 mg Rate   Route  IntraVENous Administered By  Jackqulyn Livings, RN        pantoprazole (PROTONIX) 40 mg in sodium chloride (PF) 0.9 % 10 mL injection Admin Date  08/19/2022 Action  Given Dose  40 mg Rate   Route  IntraVENous Administered By  Jackqulyn Livings, RN        sodium chloride 0.9 % 8,546 mL with folic acid 1 mg, adult multi-vitamin with vitamin k 10 mL, thiamine 100 mg Admin Date  08/19/2022 Action  New Bag Dose   Rate  1,111.2 mL/hr Route  IntraVENous Administered By  Jackqulyn Livings, RN              Ms. Gaultney tolerated the infusion, and had no complaints.    Ms. Amberg was discharged from [facility] in stable condition.     Future Appointments   Date Time Provider [place]   08/25/2022  7:40 AM Loney Hering, APRN - NP [place] BS AMB   09/07/2022  1:45 PM Lamount Cranker, MD [place] BS AMB   10/21/2022  3:40 PM Doloresco, Elta Guadeloupe, MD [place], RN  August 19, 2022  2:09 PM

## 2022-08-19 NOTE — Progress Notes (Signed)
3 weeks status post  SADI  She is tolerating 30 oz of clears daily.   No vomiting, some nausea when she over eats.   She has eaten a few bites of scrambled eggs and grits. Food feels stuck in her upper chest for about 15-20 lbs.   She just started taking her prilosec without any relief.   She has not used Levsin.   + BM. Urine is orange.   Feels tired. .       She is taking all bariatric vitamins without issue.     Total weight loss since surgery 21.8lbs      BP 96/62 (Site: Right Lower Arm, Position: Sitting, Cuff Size: Medium Adult)   Pulse 82   Temp 98 F (36.7 C) (Oral)   Resp 16   Ht 1.575 m (5\' 2" )   Wt (!) 155 kg (341 lb 12.8 oz)   SpO2 97%   BMI 62.52 kg/m            Teresa Allen has a reminder for a "due or due soon" health maintenance. I have asked that she contact her primary care provider for follow-up on this health maintenance.      Physical Examination: General appearance - alert, well appearing, and in no distress,    Abdomen - soft, nontender, nondistended  scars from previous incisions healing without erythema or induration    A/P    Doing well 3 weeks  Status post laparoscopic Sleeve gastrectomy  Will regress diet to clear liquids, shakes and very soft foods such as yogurt, cottage cheese, hummus.   Will plan for EGD next week to r/o stricture.   Plan today to get iv fluid, vitamins bag, PPI and nausea medication in [facility]     Pt verbalized understanding and questions were answered to the best of my knowledge and ability.        Dineen Kid, APRN - NP

## 2022-08-19 NOTE — Telephone Encounter (Signed)
LM for patient to let her know when her endo procedure is scheduled next week.  It is at [facility] on 08/23/22 @ 12:00 with arrival time of 11:00.  To please call me to let me know she received the message

## 2022-08-19 NOTE — Plan of Care (Signed)
Patient tolerated IV Hydration without difficulty

## 2022-08-22 NOTE — Telephone Encounter (Signed)
Patient called saying she wants to cancel endoscopy with Dr Tamala Julian tomorrow.  She is feeling better on the medication.  She is able to drink water and protein shakes.

## 2022-08-25 ENCOUNTER — Ambulatory Visit: Payer: PRIVATE HEALTH INSURANCE | Attending: Nurse Practitioner | Primary: Family

## 2022-08-25 ENCOUNTER — Ambulatory Visit
Admit: 2022-08-25 | Discharge: 2022-08-25 | Payer: PRIVATE HEALTH INSURANCE | Attending: Nurse Practitioner | Primary: Family

## 2022-08-25 NOTE — Progress Notes (Signed)
Identified patient with two patient identifiers (name and DOB). Reviewed chart in preparation for visit and have obtained necessary documentation.    Teresa Allen is a 46 y.o. female  Chief Complaint   Patient presents with    Post-Op Check     4 week s/p   ROBOTIC SINGLE ANATOMOSIS DUODENAL ILEOSTOMY, ROBOTIC SLEEVE GASTRECTOMY (E R A S), RESECTION OF MEKEALS DIVERTICULUM      There were no vitals taken for this visit.    1. Have you been to the ER, urgent care clinic since your last visit?  Hospitalized since your last visit?no    2. Have you seen or consulted any other health care providers outside of the [facility] since your last visit?  Include any pap smears or colon screening. no

## 2022-08-25 NOTE — Progress Notes (Signed)
4 weeks status post SADI  She is doing much better on liquids and soft foods with resume PPI and levsin  No complaints of pain.   Tolerating some soft foods.   Vomited once when she eat too quickly.   She cancelled EGD due to felling so much better.     She is taking all bariatric vitamins without issue.     Total weight loss since surgery 21.8lbs      There were no vitals taken for this visit.         Teresa Allen has a reminder for a "due or due soon" health maintenance. I have asked that she contact her primary care provider for follow-up on this health maintenance.      Physical Examination: General appearance - alert, well appearing, and in no distress,  Chest - no respiratory distress    Abdomen - incisions healing well.     A/P    Doing well 4 weeks  Status post laparoscopic SADI  Diet advanced to soft meats  Focus on 50-60 grams of protein daily.   Supplement with unflavored protein powder daily.   Encouraged water intake to at least 64 oz of non-carbonated/no calorie beverages.  Continue levsin   Continue PPI  No lifting greater than 40 lbs  Follow up 2 weeks.     PT verbalized understanding and questions were answered to the best of my knowledge and ability.    diet educational materials were provided.    Teresa Allen, was evaluated through a synchronous (real-time) audio-video encounter. The patient (or guardian if applicable) is aware that this is a billable service, which includes applicable co-pays. This Virtual Visit was conducted with patient's (and/or legal guardian's) consent. Patient identification was verified, and a caregiver was present when appropriate.   The patient was located at Home: [place] [id number]  Provider was located at [facility] (Appt Dept): [place] [address]       Total time spent for this encounter:  [place], APRN - NP on 08/25/2022 at 10:30 AM    An electronic signature was used to authenticate this  note.   Dineen Kid, APRN - NP

## 2022-09-07 ENCOUNTER — Ambulatory Visit: Admit: 2022-09-07 | Discharge: 2022-09-07 | Payer: PRIVATE HEALTH INSURANCE | Attending: Surgery | Primary: Family

## 2022-09-07 DIAGNOSIS — Z9889 Other specified postprocedural states: Secondary | ICD-10-CM

## 2022-09-07 MED ORDER — HYOSCYAMINE SULFATE SL 0.125 MG SL SUBL
0.125 MG | Freq: Three times a day (TID) | SUBLINGUAL | 1 refills | Status: AC | PRN
Start: 2022-09-07 — End: ?

## 2022-09-07 MED ORDER — COLESTIPOL HCL 1 G PO TABS
1 g | ORAL_TABLET | Freq: Two times a day (BID) | ORAL | 3 refills | Status: AC
Start: 2022-09-07 — End: ?

## 2022-09-07 NOTE — Progress Notes (Signed)
Surgery Progress Note    09/07/2022    CC: Post op state    Subjective:     Patient is following up today after 6 weeks from SADI.  Was not taking her PPI and Levsin was having a problem few weeks ago but that responded well to the PPI.  Admits to having relatively high carbohydrate diet to throw her stomach.  Having some diarrhea recently which he thinks is heavy with bile.    Down 18 pounds from surgery    Visit done over the telephone because of failure with video communication.    Constitutional: No fever or chills  Neurologic: No headache  Eyes: No scleral icterus or irritated eyes  Nose: No nasal pain or drainage  Mouth: No oral lesions or sore throat  Cardiac: No palpations or chest pain  Pulmonary: No cough or shortness of breath  Gastrointestinal: Diarrhea, no nausea, emesis, or constipation  Genitourinary: No dysuria  Musculoskeletal: No muscle or joint tenderness  Skin: No rashes or lesions  Psychiatric: No anxiety or depressed mood    Objective:   There were no vitals filed for this visit.  Body mass index is 62.19 kg/m.  Wt 340 lbs    General: No acute distress, conversant    Assessment:     46 year old female status post SADI doing fair    Plan:     Continue PPI.  Levsin reordered.  I told her to cut down on her shakes and limit them possible.  Okay for all texture diet.  Decrease carbs.  No more than 100 carbs a day.  Hopefully the change will improve her diarrhea, if not I ordered her colestipol to try.  Increase exercise.  Will see her back in 4 months postop mark with NP's.      Lamount Cranker, MD, FACS, [facility]  Bariatric and General Surgeon  [facility]

## 2022-09-07 NOTE — Progress Notes (Signed)
Identified patient with two patient identifiers (name and DOB). Reviewed chart in preparation for visit and have obtained necessary documentation.    Teresa Allen is a 46 y.o. female  Chief Complaint   Patient presents with    Post-Op Check     6 week s/p ROBOTIC SINGLE ANATOMOSIS DUODENAL ILEOSTOMY, ROBOTIC SLEEVE GASTRECTOMY (E R A S), RESECTION OF MEKEALS DIVERTICULUM     There were no vitals taken for this visit.    1. Have you been to the ER, urgent care clinic since your last visit?  Hospitalized since your last visit?no    2. Have you seen or consulted any other health care providers outside of the [facility] since your last visit?  Include any pap smears or colon screening. no

## 2022-09-16 ENCOUNTER — Ambulatory Visit
Admit: 2022-09-16 | Discharge: 2022-09-16 | Payer: MEDICAID | Attending: Student in an Organized Health Care Education/Training Program | Primary: Family

## 2022-09-16 DIAGNOSIS — J069 Acute upper respiratory infection, unspecified: Secondary | ICD-10-CM

## 2022-09-16 NOTE — Progress Notes (Signed)
Chief Complaint   Patient presents with    Cough     Cough x 1 week , low grade fever for 3 days - chest pain when coughing and wheezing   Negative covid test this morning      Vitals:    09/16/22 1300   BP: (!) 95/57   Pulse: 76   Temp: 97.8 F (36.6 C)   TempSrc: Oral   SpO2: 95%   Weight: (!) 153.8 kg (339 lb)     1. Have you been to the ER, urgent care clinic since your last visit?  Hospitalized since your last visit?No    2. Have you seen or consulted any other health care providers outside of the [facility] since your last visit?  Include any pap smears or colon screening. No

## 2022-09-16 NOTE — Progress Notes (Signed)
I reviewed with the resident the medical history and the resident's findings on the physical examination.  I discussed with the resident the patient's diagnosis and concur with the plan.

## 2022-09-16 NOTE — Progress Notes (Signed)
[address]  [place], [place] [address]   Office [phone], Fax [phone]    Subjective:     Chief Complaint   Patient presents with    Cough     Cough x 1 week , low grade fever for 3 days - chest pain when coughing and wheezing   Negative covid test this morning        HPI:  Teresa Allen is a 46 y.o. female with a history of morbid obesity that presents for:    URI Concern:  Symptoms started 6 days ago, Saturday  Son at home with similar symptoms, no positive tests  Covid negative test this morning  Initially with fever, low grade, 100.1. Only for first couple days.  Most significant symptoms is cough.   Tylenol and Mucinex at home for cough suppression.   Body aches resolved with fever.   Coughing so much it is causing some chest pain, but only during coughing fits.   Feels slightly short of breath when active.             Health Maintenance:  Health Maintenance Due   Topic Date Due    Hepatitis B vaccine (1 of 3 - 3-dose series) Never done    COVID-19 Vaccine (1) Never done    HIV screen  Never done    DTaP/Tdap/Td vaccine (1 - Tdap) Never done    Cervical cancer screen  Never done    Flu vaccine (1) Never done          Past Medical Hx  I personally reviewed.  Past Medical History:   Diagnosis Date    COVID-19 2021    High cholesterol     Hypotension 2021    on going issue due to prior covid infection    Sleep apnea         SocHx   I personally reviewed.  Social History     Socioeconomic History    Marital status: Divorced     Spouse name: Not on file    Number of children: Not on file    Years of education: Not on file    Highest education level: Not on file   Occupational History    Not on file   Tobacco Use    Smoking status: Never    Smokeless tobacco: Never   Vaping Use    Vaping Use: Never used   Substance and Sexual Activity    Alcohol use: Never    Drug use: Never    Sexual activity: Not on file   Other Topics Concern    Not on file   Social History Narrative    Not on file      Social Determinants of Health     Financial Resource Strain: Low Risk  (09/16/2022)    Overall Financial Resource Strain (CARDIA)     Difficulty of Paying Living Expenses: Not very hard   Food Insecurity: No Food Insecurity (09/16/2022)    Hunger Vital Sign     Worried About Running Out of Food in the Last Year: Never true     Ran Out of Food in the Last Year: Never true   Transportation Needs: Unknown (09/16/2022)    PRAPARE - Therapist, art (Medical): Not on file     Lack of Transportation (Non-Medical): No   Physical Activity: Not on file   Stress: Not on file   Social Connections: Not on file  Intimate Partner Violence: Not on file   Housing Stability: Unknown (09/16/2022)    Housing Stability Vital Sign     Unable to Pay for Housing in the Last Year: Not on file     Number of Places Lived in the Last Year: Not on file     Unstable Housing in the Last Year: No        Allergies  I personally reviewed.  Allergies   Allergen Reactions    Morphine Headaches        Medications  I personally reviewed.  Current Outpatient Medications on File Prior to Visit   Medication Sig Dispense Refill    Hyoscyamine Sulfate SL (LEVSIN/SL) 0.125 MG SUBL Place 1 tablet under the tongue every 8 hours as needed (for spasms) 45 each 1    colestipol (COLESTID) 1 g tablet Take 1 tablet by mouth 2 times daily 60 tablet 3    polyethylene glycol (GLYCOLAX) 17 GM/SCOOP powder Take 17 g by mouth daily (Patient not taking: Reported on 08/11/2022) 1530 g 0    ondansetron (ZOFRAN) 4 MG tablet Take 1 tablet by mouth every 8 hours as needed for Nausea or Vomiting (Patient not taking: Reported on 08/11/2022) 30 tablet 0    gabapentin (NEURONTIN) 100 MG capsule Take 1-2 capsules by mouth 3 times daily for 5 days. Max Daily Amount: 600 mg (Patient not taking: Reported on 07/18/2022) 30 capsule 0    omeprazole (PRILOSEC) 40 MG delayed release capsule Take 1 capsule by mouth every morning (before breakfast) 90 capsule 1     Hyoscyamine Sulfate SL (LEVSIN/SL) 0.125 MG SUBL Place 0.125 mg under the tongue every 6 hours as needed (cramping) 30 each 0    vitamin D (ERGOCALCIFEROL) 1.25 MG (50000 UT) CAPS capsule Take 1 capsule by mouth once a week 12 capsule 0    Multiple Vitamins-Minerals (MULTIVITAL PO) Take by mouth      calcium carb-cholecalciferol (CALCIUM 600+D) 600-10 MG-MCG TABS per tab Take 1 tablet by mouth daily      rosuvastatin (CRESTOR) 10 MG tablet Take 1 tablet by mouth nightly       No current facility-administered medications on file prior to visit.        ROS:   As  stated in HPI        Objective:   Vitals  I personally reviewed.  BP (!) 95/57   Pulse 76   Temp 97.8 F (36.6 C) (Oral)   Wt (!) 153.8 kg (339 lb)   SpO2 95%   BMI 62.00 kg/m      Physical Exam:    Physical Exam  Constitutional:       Appearance: Normal appearance. She is obese.   HENT:      Right Ear: Tympanic membrane and external ear normal.      Left Ear: Tympanic membrane and external ear normal.      Nose: Nose normal. No congestion or rhinorrhea.      Mouth/Throat:      Mouth: Mucous membranes are moist.      Pharynx: Oropharynx is clear. No oropharyngeal exudate or posterior oropharyngeal erythema.   Eyes:      Extraocular Movements: Extraocular movements intact.      Conjunctiva/sclera: Conjunctivae normal.   Cardiovascular:      Rate and Rhythm: Normal rate and regular rhythm.      Pulses: Normal pulses.      Heart sounds: Normal heart sounds.   Pulmonary:  Effort: Pulmonary effort is normal. No respiratory distress.      Breath sounds: Normal breath sounds. No wheezing.   Musculoskeletal:      Cervical back: Normal range of motion and neck supple. No rigidity or tenderness.   Lymphadenopathy:      Cervical: No cervical adenopathy.   Neurological:      Mental Status: She is alert.          Notable Labs and Imaging:   na     Assessment/Plan:   Assessment:   47 year old female with history of morbid obesity presents due to 6 days of URI  concerns.  No indication for strep or flu testing at this time, home COVID testing negative.  Diagnosed with viral URI, and patient to return if symptoms worsen or fail to improve by day 10.      1. Viral URI  Day 6 of symptoms, no indication for testing flu or strep at this point.  Patient afebrile, tolerating p.o. well.  If symptoms continue to be present up through day 10, patient instructed to call back for follow-up and possible bacterial sinusitis treatment.  Vital signs reassuring, pulm exam with no focal findings.      No follow-ups on file.         Pt was discussed with Dr Natalia Leatherwood (attending physician).    I have reviewed patient medical and social history and medications.  I have reviewed pertinent labs results and other data. I have discussed the diagnosis with the patient and the intended plan as seen in the above orders. The patient has received an after-visit summary and questions were answered concerning future plans. I have discussed medication side effects and warnings with the patient as well.    Gerrit Halls, MD  Resident [place]

## 2022-09-29 ENCOUNTER — Telehealth
Admit: 2022-09-29 | Discharge: 2022-09-29 | Payer: MEDICAID | Attending: Student in an Organized Health Care Education/Training Program | Primary: Family

## 2022-09-29 DIAGNOSIS — J019 Acute sinusitis, unspecified: Secondary | ICD-10-CM

## 2022-09-29 MED ORDER — ALBUTEROL SULFATE HFA 108 (90 BASE) MCG/ACT IN AERS
108 (90 Base) MCG/ACT | Freq: Four times a day (QID) | RESPIRATORY_TRACT | 0 refills | Status: AC | PRN
Start: 2022-09-29 — End: ?

## 2022-09-29 MED ORDER — FLUTICASONE PROPIONATE 50 MCG/ACT NA SUSP
50 MCG/ACT | Freq: Every day | NASAL | 0 refills | Status: AC
Start: 2022-09-29 — End: ?

## 2022-09-29 MED ORDER — AMOXICILLIN-POT CLAVULANATE 875-125 MG PO TABS
875-125 MG | ORAL_TABLET | Freq: Two times a day (BID) | ORAL | 0 refills | Status: AC
Start: 2022-09-29 — End: 2022-10-06

## 2022-09-29 NOTE — Progress Notes (Signed)
Teresa Allen  46 y.o. female  1976-09-04  [place] [id number]  [id number]   [place]:    Telemedicine Progress Note  Lowell Guitar, MD       Encounter Date and Time: September 29, 2022 at 1:11 PM    Consent: Teresa Allen, who was seen by synchronous (real-time) audio-video technology, and/or her healthcare decision maker, is aware that this patient-initiated, Telehealth encounter on 09/29/2022 is a billable service, with coverage as determined by her insurance carrier. She is aware that she may receive a bill and has provided verbal consent to proceed:  yes .     Chief Complaint   Patient presents with    Cough     History of Present Illness   Teresa Allen is a 46 y.o. female was evaluated by synchronous (real-time) audio-video technology from home, through a secure patient portal.    URI symptoms going on for about 4 weeks now. Worst symptom is coughing, which has not improved with Robitussin or Mucinex. Feels like she is not getting any better. Feels like she is hoarse, but no sore throat. Has congestion, as well. Had fever the first few days a month ago, but none since.     Review of Systems   Review of Systems   Constitutional:  Negative for chills and fever.   HENT:  Positive for congestion. Negative for sore throat.    Respiratory:  Positive for cough.      Vitals/Objective:     General: alert, cooperative, and no distress   Mental  status: mental status: alert, oriented to person, place, and time, normal mood, behavior, speech, dress, motor activity, and thought processes   Resp: Unlabored breathing, speaking without issue   Neuro: neuro: Alert and oriented   Skin: No visible lesions or discolorations   Due to this being a TeleHealth evaluation, many elements of the physical examination are unable to be assessed.    Assessment and Plan:     1. Acute bacterial sinusitis  Ongoing for >14 days, qualifies for abx.  - amoxicillin-clavulanate (AUGMENTIN)  875-125 MG per tablet; Take 1 tablet by mouth 2 times daily for 7 days  Dispense: 14 tablet; Refill: 0    2. Subacute cough  Likely postviral vs post-nasal drip. Flonase and albuterol, which can also help with some SOB she's been experiencing. She had a 15 day admission for Covid, so may have some long term damage from that.  - fluticasone (FLONASE) 50 MCG/ACT nasal spray; 2 sprays by Each Nostril route daily  Dispense: 16 g; Refill: 0  - albuterol sulfate HFA (VENTOLIN HFA) 108 (90 Base) MCG/ACT inhaler; Inhale 2 puffs into the lungs 4 times daily as needed for Wheezing  Dispense: 18 g; Refill: 0      Time spent in direct conversation with the patient to include medical condition(s) discussed, assessment and treatment plan:       We discussed the expected course, resolution and complications of the diagnosis(es) in detail.  Medication risks, benefits, costs, interactions, and alternatives were discussed as indicated.  I advised her to contact the office if her condition worsens, changes or fails to improve as anticipated. She expressed understanding with the diagnosis(es) and plan. Patient understands that this encounter was a temporary measure, and the importance of further follow up and examination was emphasized.  Patient verbalized understanding.       Patient informed to follow up: .Return in about 1  week (around 10/06/2022), or if symptoms worsen or fail to improve.    Electronically Signed: Laymond Purser, MD    CPT Codes [phone] for Established Patients may apply to this Telehealth Visit.  POS code: 80.  Modifier GT    Teresa Allen is a 46 y.o. female who was evaluated by an audio-video encounter for concerns as above. Patient identification was verified prior to start of the visit. A caregiver was present when appropriate. Due to this being a Scientist, research (medical) (During COVID-19 public health emergency), evaluation of the following organ systems was limited:  Vitals/Constitutional/EENT/Resp/CV/GI/GU/MS/Neuro/Skin/Heme-Lymph-Imm.  Pursuant to the emergency declaration under the [facility] Act and the [employer], 1135 waiver authority and the [employer] Act, this Virtual Visit was conducted, with patient's (and/or legal guardian's) consent, to reduce the patient's risk of exposure to COVID-19 and provide necessary medical care.     Services were provided through a synchronous discussion virtually to substitute for in-person clinic visit. I was  at the office . The patient was  at home .     History   Patients past medical, surgical and family histories were reviewed and updated.      Past Medical History:   Diagnosis Date    COVID-19 2021    High cholesterol     Hypotension 2021    on going issue due to prior covid infection    Sleep apnea      Past Surgical History:   Procedure Laterality Date    BACK SURGERY  2018    discectomy    CHOLECYSTECTOMY, LAPAROSCOPIC  2012    DILATION AND EVACUATION  2008    SLEEVE GASTRECTOMY N/A 07/28/2022    ROBOTIC SINGLE ANATOMOSIS DUODENAL ILEOSTOMY, ROBOTIC SLEEVE GASTRECTOMY (E R A S), RESECTION OF MEKEALS DIVERTICULUM performed by Darliss Cheney, MD at [facility] MAIN OR    TONSILLECTOMY  1980's     Family History   Problem Relation Age of Onset    Hypertension Mother     Kidney Cancer Father     No Known Problems Sister     No Known Problems Sister     Epilepsy Sister     No Known Problems Sister     Anesth Problems Neg Hx      Social History     Tobacco Use    Smoking status: Never    Smokeless tobacco: Never   Vaping Use    Vaping Use: Never used   Substance Use Topics    Alcohol use: Never    Drug use: Never     Patient Active Problem List   Diagnosis    Elevated liver enzymes    Pneumonia due to COVID-19 virus    Acute respiratory failure with hypoxia (HCC)    SIRS (systemic inflammatory response syndrome) (HCC)    COVID-19    Morbid obesity (HCC)    Dehydration           Current Medications/Allergies   Medications and Allergies reviewed:    Current Outpatient Medications   Medication Sig Dispense Refill    amoxicillin-clavulanate (AUGMENTIN) 875-125 MG per tablet Take 1 tablet by mouth 2 times daily for 7 days 14 tablet 0    fluticasone (FLONASE) 50 MCG/ACT nasal spray 2 sprays by Each Nostril route daily 16 g 0    albuterol sulfate HFA (VENTOLIN HFA) 108 (90 Base) MCG/ACT inhaler Inhale 2 puffs into the lungs 4 times daily as needed for Wheezing 18  g 0    Hyoscyamine Sulfate SL (LEVSIN/SL) 0.125 MG SUBL Place 1 tablet under the tongue every 8 hours as needed (for spasms) 45 each 1    colestipol (COLESTID) 1 g tablet Take 1 tablet by mouth 2 times daily 60 tablet 3    polyethylene glycol (GLYCOLAX) 17 GM/SCOOP powder Take 17 g by mouth daily (Patient not taking: Reported on 08/11/2022) 1530 g 0    ondansetron (ZOFRAN) 4 MG tablet Take 1 tablet by mouth every 8 hours as needed for Nausea or Vomiting (Patient not taking: Reported on 08/11/2022) 30 tablet 0    gabapentin (NEURONTIN) 100 MG capsule Take 1-2 capsules by mouth 3 times daily for 5 days. Max Daily Amount: 600 mg (Patient not taking: Reported on 07/18/2022) 30 capsule 0    omeprazole (PRILOSEC) 40 MG delayed release capsule Take 1 capsule by mouth every morning (before breakfast) 90 capsule 1    Hyoscyamine Sulfate SL (LEVSIN/SL) 0.125 MG SUBL Place 0.125 mg under the tongue every 6 hours as needed (cramping) 30 each 0    vitamin D (ERGOCALCIFEROL) 1.25 MG (50000 UT) CAPS capsule Take 1 capsule by mouth once a week 12 capsule 0    Multiple Vitamins-Minerals (MULTIVITAL PO) Take by mouth      calcium carb-cholecalciferol (CALCIUM 600+D) 600-10 MG-MCG TABS per tab Take 1 tablet by mouth daily      rosuvastatin (CRESTOR) 10 MG tablet Take 1 tablet by mouth nightly       No current facility-administered medications for this visit.     Allergies   Allergen Reactions    Morphine Headaches

## 2022-09-30 NOTE — Progress Notes (Signed)
I reviewed with the resident the medical history and the resident's findings on the physical examination.  I discussed with the resident the patient's diagnosis and concur with the plan.

## 2022-10-21 ENCOUNTER — Ambulatory Visit: Admit: 2022-10-21 | Discharge: 2022-10-21 | Payer: PRIVATE HEALTH INSURANCE | Attending: Specialist | Primary: Family

## 2022-10-21 DIAGNOSIS — R001 Bradycardia, unspecified: Secondary | ICD-10-CM

## 2022-10-21 NOTE — Progress Notes (Signed)
CARDIOLOGY OFFICE NOTE    Nyan Dufresne A. Salam Chesterfield, MD, [facility]    [address], [address], [place], [place] [address]  Phone [phone]; Fax [phone]  Mobile [phone]   Voice Mail [phone]    Primary care: Luther Hearing, APRN - NP       ATTENTION:   This medical record was transcribed using an electronic medical records/speech recognition system.  Although proofread, it may and can contain electronic, spelling and other errors.  Corrections may be executed at a later time.  Please feel free to contact us for any clarifications as needed.             Teresa Allen is a 47 y.o. female with  referred for   bradycardia and hypotension            Cardiac risk factors: obesity, dyslipidemia   I have personally obtained the history from the patient.              HISTORY OF PRESENTING ILLNESS    Teresa Allen  47 y.o. looks great and has lost a significant amount of weight after her gastric bypass surgery.  She is doing well and walked through [place] with her son and was able to walk without using a electric wheelchair.  We reviewed again that her event monitor showed a brief episode of A-fib which I think was an error but we will repeat this and she is in agreement to going forward with this.    She has not felt any irregularity in her heart rhythm.     ACTIVE PROBLEM LIST     Patient Active Problem List    Diagnosis Date Noted    Elevated liver enzymes 09/03/2020    Pneumonia due to COVID-19 virus 09/03/2020    Acute respiratory failure with hypoxia (HCC) 09/03/2020    SIRS (systemic inflammatory response syndrome) (HCC) 09/03/2020    COVID-19 09/03/2020           PAST MEDICAL HISTORY     Past Medical History:   Diagnosis Date    Sleep apnea            PAST SURGICAL HISTORY     Past Surgical History:   Procedure Laterality Date    IR CHOLECYSTOSTOMY PERCUTANEOUS COMPLETE  2012    LUMBAR FUSION      removal of 20 % of disc    TONSILLECTOMY  1980's          ALLERGIES      No Known Allergies       FAMILY HISTORY     No family history on file. negative for cardiac disease       SOCIAL HISTORY     Social History     Socioeconomic History    Marital status: Divorced     Spouse name: None    Number of children: None    Years of education: None    Highest education level: None   Tobacco Use    Smoking status: Never    Smokeless tobacco: Never   Substance and Sexual Activity    Alcohol use: Never    Drug use: Never         MEDICATIONS     Current Outpatient Medications   Medication Sig    TURMERIC PO Take by mouth as needed    rosuvastatin (CRESTOR) 10 MG tablet Take 1 tablet by mouth nightly    Biotin 2.5 MG CAPS Take by mouth (Patient not taking:  Reported on 04/18/2022)     No current facility-administered medications for this visit.       I have reviewed the nurses notes, vitals, problem list, allergy list, medical history, family, social history and medications.       REVIEW OF SYMPTOMS    As per HPI  General: Pt denies excessive weight gain or loss. Pt is able to conduct ADL's  HEENT: Denies blurred vision, headaches, hearing loss, epistaxis and difficulty swallowing.  Respiratory: Denies cough, congestion, shortness of breath, DOE, wheezing or stridor.  Cardiovascular: Denies precordial pain, palpitations, edema or PND  Gastrointestinal: Denies poor appetite, indigestion, abdominal pain or blood in stool  Genitourinary: Denies hematuria, dysuria, increased urinary frequency  Musculoskeletal: Denies joint pain or swelling from muscles or joints  Neurologic: Denies tremor, paresthesias, headache, or sensory motor disturbance  Psychiatric: Denies confusion, insomnia, depression  Integumentray: Denies rash, itching or ulcers.  Hematologic: Denies easy bruising, bleeding     PHYSICAL EXAMINATION      Vitals:    04/18/22 1541   BP: 120/80   Site: Left Upper Arm   Position: Sitting   Cuff Size: Large Adult   Pulse: 78   SpO2: 98%   Weight: (!) 377 lb (171 kg)   Height: 5\' 2"  (1.575 m)      General: Well developed, in no acute distress.  Overweight but is definitely lost a significant amount of weight and looks good  HEENT: No jaundice,  Neck: Supple, no stiffness,  Heart:  Normal S1/S2 negative S3 or S4. Regular, no murmur, gallop or rub, no jugular venous distention  Respiratory: Clear bilaterally x 4, no wheezing or rales  Extremities:  No edema, normal cap refill, no cyanosis.  Musculoskeletal: No clubbing, no deformities  Neuro: A&Ox3, speech clear, gait stable, cooperative, no focal neurologic deficits  Skin: Skin color is normal. No rashes or lesions. Non diaphoretic, moist.        EKG: Date: (04/18/2022) sinus rhythm incomplete right bundle branch block     DIAGNOSTIC DATA     1. Echo  09/10/20- EF 55-60%    2. Loop  1/10-11/17/20- no critical or serious events and 8 stable events all of which were sinus rhythm on one occasion and states that it was atrial fibrillation but on one portion it seemed to march out appropriately and the last 3 beats were regular, average HR 84, min 52 and max 143    3. Lipids  10/23/20- TC 209, HDL 52, LDL 135, TG 122              LABORATORY DATA       Lab Results   Component Value Date/Time    WBC 5.7 11/25/2020 03:25 PM    HGB 13.9 11/25/2020 03:25 PM    HCT 43.9 11/25/2020 03:25 PM    PLT 343 11/25/2020 03:25 PM    MCV 95.2 11/25/2020 03:25 PM      Lab Results   Component Value Date/Time    NA 140 09/16/2020 12:53 AM    K 4.4 09/16/2020 12:53 AM    CL 106 09/16/2020 12:53 AM    CO2 27 09/16/2020 12:53 AM    BUN 16 09/16/2020 12:53 AM    GFRAA >60 09/16/2020 12:53 AM    GLOB 3.0 09/16/2020 12:53 AM    ALT 18 10/23/2020 02:43 PM          ICD-10-CM    1. Bradycardia, unspecified  R00.1 EKG 12 Lead  2. Tachycardia, unspecified  R00.0       3. Morbid (severe) obesity due to excess calories (HCC)  E66.01               ASSESSMENT/RECOMMENDATIONS:.       1.  Bradycardia and hypotension  -No issues regarding her heart rhythm ECG today is a normal sinus rhythm   2.   Morbid obesity  -BMI is.  Continues to lose weighte   3. Apnea    -I did not review sleep apnea with her   5.  Screening cholesterol   -LDL is now at goal at 8   -crestor 10 mg a day   -  Lab Results   Component Value Date    CHOL 108 04/30/2022    TRIG 70 04/30/2022    HDL 45 04/30/2022    LDLCALC 48 04/30/2022    LABVLDL 15 04/30/2022    VLDL 22 10/23/2020         6.  Possible A. Fib   -2 episodes of irregularity and has used the alivcor and no atrial fibrillation  -Repeat a cardiac monitor     8.  Up with me in  6 months or as needed    Orders Placed This Encounter   Procedures    EKG 12 Lead     Order Specific Question:   Reason for Exam?     Answer:   Hypotension       We discussed the expected course, resolution and complications of the diagnosis(es) in detail.  Medication risks, benefits, costs, interactions, and alternatives were discussed as indicated.  I advised him to contact the office if his condition worsens, changes or fails to improve as anticipated. He expressed understanding with the diagnosis(es) and plan        No follow-up provider specified.      I have discussed the diagnosis with  Teresa Allen and the intended plan as seen in the above orders.  Questions were answered concerning future plans.  I have discussed medication side effects and warnings with the patient as well.    Thank you,  Sid Falcon, APRN - NP for involving me in the care of  Teresa Allen. Please do not hesitate to contact me for further questions/concerns.         Gilad Dugger A.Abisola Carrero,  MD, [facility]      [address] [place], [place]     [place], [place]      [phone] / [phone] Fax

## 2022-10-21 NOTE — Telephone Encounter (Signed)
Dr Doloresco pt needs 4 week loop for irreg HB

## 2022-10-21 NOTE — Progress Notes (Signed)
Chief Complaint   Patient presents with    Bradycardia    Tachycardia     Vitals:    10/21/22 1557   BP: 106/60   Site: Left Upper Arm   Position: Sitting   Cuff Size: Large Adult   Pulse: 74   SpO2: 98%   Weight: (!) 151 kg (333 lb)   Height: 1.575 m (5\' 2" )      BP 106/60 (Site: Left Upper Arm, Position: Sitting, Cuff Size: Large Adult)   Pulse 74   Ht 1.575 m (5\' 2" )   Wt (!) 151 kg (333 lb)   SpO2 98%   BMI 60.91 kg/m      NAME Teresa Allen     DOB    03/19/1976      MRN    [id number]      LAST OFFICE APPOINTMENT: 04/18/2022     DIAGNOSIS  No diagnosis found.    HOME MEDICATION  Current Outpatient Medications   Medication Sig    fluticasone (FLONASE) 50 MCG/ACT nasal spray 2 sprays by Each Nostril route daily    albuterol sulfate HFA (VENTOLIN HFA) 108 (90 Base) MCG/ACT inhaler Inhale 2 puffs into the lungs 4 times daily as needed for Wheezing    Hyoscyamine Sulfate SL (LEVSIN/SL) 0.125 MG SUBL Place 1 tablet under the tongue every 8 hours as needed (for spasms)    omeprazole (PRILOSEC) 40 MG delayed release capsule Take 1 capsule by mouth every morning (before breakfast)    Hyoscyamine Sulfate SL (LEVSIN/SL) 0.125 MG SUBL Place 0.125 mg under the tongue every 6 hours as needed (cramping)    vitamin D (ERGOCALCIFEROL) 1.25 MG (50000 UT) CAPS capsule Take 1 capsule by mouth once a week    Multiple Vitamins-Minerals (MULTIVITAL PO) Take by mouth    calcium carb-cholecalciferol (CALCIUM 600+D) 600-10 MG-MCG TABS per tab Take 1 tablet by mouth daily    colestipol (COLESTID) 1 g tablet Take 1 tablet by mouth 2 times daily (Patient not taking: Reported on 10/21/2022)    ondansetron (ZOFRAN) 4 MG tablet Take 1 tablet by mouth every 8 hours as needed for Nausea or Vomiting (Patient not taking: Reported on 08/11/2022)    gabapentin (NEURONTIN) 100 MG capsule Take 1-2 capsules by mouth 3 times daily for 5 days. Max Daily Amount: 600 mg (Patient not taking: Reported on 07/18/2022)    rosuvastatin (CRESTOR) 10 MG  tablet Take 1 tablet by mouth nightly (Patient not taking: Reported on 10/21/2022)     No current facility-administered medications for this visit.       VITAL SIGNS  Wt Readings from Last 3 Encounters:   10/21/22 (!) 151 kg (333 lb)   09/16/22 (!) 153.8 kg (339 lb)   09/07/22 (!) 154.2 kg (340 lb)     BP Readings from Last 3 Encounters:   10/21/22 106/60   09/16/22 (!) 95/57   08/19/22 (!) 89/52     Pulse Readings from Last 3 Encounters:   10/21/22 74   09/16/22 76   08/19/22 63         SPECIALTY COMMENTS  1. Echo  09/10/20- EF 55-60%    2. Loop  1/10-11/17/20- no critical or serious events and 8 stable events all of which were sinus rhythm on one occasion and states that it was atrial fibrillation but on one portion it seemed to march out appropriately and the last 3 beats were regular, average HR 84, min 52 and max 143  3. Lipids  10/23/20- TC 209, HDL 52, LDL 135, TG 122  04/30/22- TC 108, HDL 45, LDL 48, TG 70

## 2022-11-16 LAB — LIPID PANEL
Cholesterol: 159 mg/dL (ref 100–199)
HDL: 46 mg/dL (ref >39)
LDL Calculated: 97 mg/dL (ref 0–99)
Triglycerides: 85 mg/dL (ref 0–149)
VLDL Cholesterol Calculated: 16 mg/dL (ref 5–40)

## 2022-11-22 NOTE — Other (Signed)
Lipids are at goal. No action needed. I would like the cholesterol checked again in 6 mo.Continue to eat healthy and clean.

## 2023-01-24 NOTE — Telephone Encounter (Signed)
LM for pt to call and schedule 8 month bariatric exam. Letter sent. [facility]

## 2023-04-21 ENCOUNTER — Ambulatory Visit: Payer: MEDICAID | Attending: Specialist | Primary: Family

## 2023-05-23 NOTE — Telephone Encounter (Signed)
LM for pt to call and schedule annual bariatric exam.  Letter sent. [facility]

## 2023-06-20 ENCOUNTER — Telehealth: Admit: 2023-06-20 | Discharge: 2023-06-20 | Payer: MEDICAID | Attending: Nurse Practitioner | Primary: Family

## 2023-06-20 NOTE — Progress Notes (Signed)
 Identified pt with two pt identifiers (name and DOB). Reviewed chart in preparation for visit and have obtained necessary documentation.    Teresa Allen is a 47 y.o. female Weight Management (Yearly bariatric exam )  .    There were no vitals filed for this visit.       1. Have you been to the ER, urgent care clinic since your last visit?  Hospitalized since your last visit?  no     2. Have you seen or consulted any other health care providers outside of the [facility] since your last visit?  Include any pap smears or colon screening.  no

## 2023-06-20 NOTE — Progress Notes (Signed)
 Teresa Allen (DOB:  07-24-1976) is a 47 y.o. female,Established patient, here for evaluation of the following chief complaint(s):  Weight Management (Yearly bariatric exam )        SUBJECTIVE/OBJECTIVE:    HPI:  Teresa Allen is a 47 y.o. female with previous SADI surgery on 11 months ago. . She has lost a total of 75 pounds since surgery. She  has lost 75 lbs since the last ov.  There is no height or weight on file to calculate BMI.. Occasionally nausea or vomiting when she eating too fast.  Occasionally Acid reflux/heartburn, takes Prilosec as needed.. Drinking  40-60 ounces of water  daily. 70-90 g protein intake daily. + BM's. She is not consistent with exercise. She is try to stretch and walking  Weight 284 lbs  Dietary recall -    Breakfast- coffee, with protein powder.   Lunch- chicken salad, taco Tuesday  Dinner- chicken or ground turkey.     She is  snacking between meals; nut, shake and cheese.      Vitamins: SADI  MVI  Calcium   B-Vit 12  Vit D  She is inconsistent with taking her vitamins.           Teresa Allen has a reminder for a due or due soon health maintenance. I have asked that she contact her primary care provider for follow-up on this health maintenance.        COMORBIDITY     SLEEP APNEA                 NO        GERD  (req.meds)            NO  HYPERLIPIDEMIA            NO  HYPERTENSION              NO         DIABETES                         NO           Current Outpatient Medications:     vitamin D  (ERGOCALCIFEROL ) 1.25 MG (50000 UT) CAPS capsule, Take 1 capsule by mouth once a week, Disp: 12 capsule, Rfl: 0    Multiple Vitamins-Minerals (MULTIVITAL PO), Take by mouth, Disp: , Rfl:     calcium carb-cholecalciferol (CALCIUM 600+D) 600-10 MG-MCG TABS per tab, Take 1 tablet by mouth daily, Disp: , Rfl:     fluticasone  (FLONASE ) 50 MCG/ACT nasal spray, 2 sprays by Each Nostril route daily, Disp: 16 g, Rfl: 0    albuterol  sulfate HFA (VENTOLIN  HFA) 108 (90 Base) MCG/ACT inhaler,  Inhale 2 puffs into the lungs 4 times daily as needed for Wheezing, Disp: 18 g, Rfl: 0    Hyoscyamine  Sulfate SL (LEVSIN /SL) 0.125 MG SUBL, Place 1 tablet under the tongue every 8 hours as needed (for spasms), Disp: 45 each, Rfl: 1    colestipol  (COLESTID ) 1 g tablet, Take 1 tablet by mouth 2 times daily (Patient not taking: Reported on 10/21/2022), Disp: 60 tablet, Rfl: 3    ondansetron  (ZOFRAN ) 4 MG tablet, Take 1 tablet by mouth every 8 hours as needed for Nausea or Vomiting (Patient not taking: Reported on 08/11/2022), Disp: 30 tablet, Rfl: 0    gabapentin  (NEURONTIN ) 100 MG capsule, Take 1-2 capsules by mouth 3 times daily for 5 days. Max Daily Amount: 600 mg (Patient not  taking: Reported on 07/18/2022), Disp: 30 capsule, Rfl: 0    omeprazole  (PRILOSEC) 40 MG delayed release capsule, Take 1 capsule by mouth every morning (before breakfast), Disp: 90 capsule, Rfl: 1    Hyoscyamine  Sulfate SL (LEVSIN /SL) 0.125 MG SUBL, Place 0.125 mg under the tongue every 6 hours as needed (cramping), Disp: 30 each, Rfl: 0    rosuvastatin (CRESTOR) 10 MG tablet, Take 1 tablet by mouth nightly (Patient not taking: Reported on 10/21/2022), Disp: , Rfl:       There were no vitals taken for this visit.   Review of Systems   Respiratory:  Negative for shortness of breath.    Cardiovascular:  Negative for chest pain.   Gastrointestinal:  Negative for abdominal pain, nausea and vomiting.   Neurological:  Negative for dizziness and headaches.       ASSESSMENT/PLAN:    Physical Exam  HENT:      Mouth/Throat:      Mouth: Mucous membranes are moist.   Cardiovascular:      Rate and Rhythm: Normal rate and regular rhythm.      Heart sounds: Normal heart sounds. No murmur heard.     No friction rub. No gallop.   Pulmonary:      Effort: Pulmonary effort is normal. No respiratory distress.      Breath sounds: Normal breath sounds.   Abdominal:      General: Bowel sounds are normal. There is no distension.      Palpations: Abdomen is soft.       Tenderness: There is no abdominal tenderness.   Neurological:      Mental Status: She is alert and oriented to person, place, and time.         1. Morbid obesity (HCC)  -     Vitamin B12 & Folate; Future  -     Vitamin D  25 Hydroxy; Future  -     Iron; Future  -     Comprehensive Metabolic Panel; Future  -     CBC; Future  -     Vitamin A; Future  -     Vitamin E; Future  -     Vitamin K ; Future  2. Vitamin D  deficiency  -     Vitamin B12 & Folate; Future  -     Vitamin D  25 Hydroxy; Future  -     Iron; Future  -     Comprehensive Metabolic Panel; Future  -     CBC; Future  -     Vitamin A; Future  -     Vitamin E; Future  -     Vitamin K ; Future  3. Vitamin B12 deficiency  -     Vitamin B12 & Folate; Future  -     Vitamin D  25 Hydroxy; Future  -     Iron; Future  -     Comprehensive Metabolic Panel; Future  -     CBC; Future  -     Vitamin A; Future  -     Vitamin E; Future  -     Vitamin K ; Future  4. Anemia, unspecified type  -     Vitamin B12 & Folate; Future  -     Vitamin D  25 Hydroxy; Future  -     Iron; Future  -     Comprehensive Metabolic Panel; Future  -     CBC; Future  -     Vitamin A;  Future  -     Vitamin E; Future  -     Vitamin K ; Future  5. Vitamin A deficiency  -     Vitamin B12 & Folate; Future  -     Vitamin D  25 Hydroxy; Future  -     Iron; Future  -     Comprehensive Metabolic Panel; Future  -     CBC; Future  -     Vitamin A; Future  -     Vitamin E; Future  -     Vitamin K ; Future  6. Vitamin K  deficiency  -     Vitamin B12 & Folate; Future  -     Vitamin D  25 Hydroxy; Future  -     Iron; Future  -     Comprehensive Metabolic Panel; Future  -     CBC; Future  -     Vitamin A; Future  -     Vitamin E; Future  -     Vitamin K ; Future  7. Vitamin E deficiency  -     Vitamin B12 & Folate; Future  -     Vitamin D  25 Hydroxy; Future  -     Iron; Future  -     Comprehensive Metabolic Panel; Future  -     CBC; Future  -     Vitamin A; Future  -     Vitamin E; Future  -     Vitamin K ; Future  8.  Postsurgical nonabsorption  -     Vitamin B12 & Folate; Future  -     Vitamin D  25 Hydroxy; Future  -     Iron; Future  -     Comprehensive Metabolic Panel; Future  -     CBC; Future  -     Vitamin A; Future  -     Vitamin E; Future  -     Vitamin K ; Future        WE discussed being more consistent with her vitamins.       Advised patient regard to diet that is high-protein, low-fat, low-sugar, limited carbohydrates. Strive for 50-60 grams of protein daily. If having a snack, foods that are protein or fiber rich.. No eating/drinking together, chew foods well, and portion control. Measure meals. Discussed snacking behavior and to stiill pay attention to behavioral factor and habits. Drink at least 40-64 ounces of water  or non-calorie/non-carbonated beverages daily. Continue vitamin regiment daily. Exercise at least 3 days a week with cardiovascular and strength training. Patient to follow up in 3-4 months. Advised to call office if any questions/concerns.       No follow-ups on file.    An electronic signature was used to authenticate this note.    --Rocky Daring, APRN - NP

## 2023-06-30 LAB — CBC WITH AUTO DIFFERENTIAL
Basophils %: 1 %
Basophils Absolute: 0 10*3/uL (ref 0.0–0.2)
Eosinophils %: 1 %
Eosinophils Absolute: 0.1 10*3/uL (ref 0.0–0.4)
Hematocrit: 40.8 % (ref 34.0–46.6)
Hemoglobin: 13.2 g/dL (ref 11.1–15.9)
Immature Grans (Abs): 0 10*3/uL (ref 0.0–0.1)
Immature Granulocytes %: 0 %
Lymphocytes %: 36 %
Lymphocytes Absolute: 1.9 10*3/uL (ref 0.7–3.1)
MCH: 30.3 pg (ref 26.6–33.0)
MCHC: 32.4 g/dL (ref 31.5–35.7)
MCV: 94 fL (ref 79–97)
Monocytes %: 5 %
Monocytes Absolute: 0.3 10*3/uL (ref 0.1–0.9)
Neutrophils %: 57 %
Neutrophils Absolute: 3 10*3/uL (ref 1.4–7.0)
Platelets: 302 10*3/uL (ref 150–450)
RBC: 4.35 x10E6/uL (ref 3.77–5.28)
RDW: 12.3 % (ref 11.7–15.4)
WBC: 5.3 10*3/uL (ref 3.4–10.8)

## 2023-06-30 LAB — VITAMIN D 25 HYDROXY: Vit D, 25-Hydroxy: 20.2 ng/mL — ABNORMAL LOW (ref 30.0–100.0)

## 2023-06-30 MED ORDER — VITAMIN D (ERGOCALCIFEROL) 1.25 MG (50000 UT) PO CAPS
1.25 | ORAL_CAPSULE | ORAL | 0 refills | Status: AC
Start: 2023-06-30 — End: ?

## 2023-07-01 LAB — COMPREHENSIVE METABOLIC PANEL
ALT: 17 IU/L (ref 0–32)
AST: 16 IU/L (ref 0–40)
Albumin: 3.5 g/dL — ABNORMAL LOW (ref 3.9–4.9)
Alkaline Phosphatase: 115 IU/L (ref 44–121)
BUN/Creatinine Ratio: 19 (ref 9–23)
BUN: 9 mg/dL (ref 6–24)
CO2: 19 mmol/L — ABNORMAL LOW (ref 20–29)
Calcium: 8.4 mg/dL — ABNORMAL LOW (ref 8.7–10.2)
Chloride: 109 mmol/L — ABNORMAL HIGH (ref 96–106)
Creatinine: 0.48 mg/dL — ABNORMAL LOW (ref 0.57–1.00)
Est, Glom Filt Rate: 119 mL/min/{1.73_m2} (ref >59)
Globulin, Total: 2.3 g/dL (ref 1.5–4.5)
Glucose: 74 mg/dL (ref 70–99)
Potassium: 3.9 mmol/L (ref 3.5–5.2)
Sodium: 144 mmol/L (ref 134–144)
Total Bilirubin: <0.2 mg/dL (ref 0.0–1.2)
Total Protein: 5.8 g/dL — ABNORMAL LOW (ref 6.0–8.5)

## 2023-07-01 LAB — IRON: Iron: 86 ug/dL (ref 27–159)

## 2023-07-04 LAB — VITAMIN K: Vitamin K1: 0.2 ng/mL (ref 0.10–2.20)

## 2023-07-04 LAB — VITAMIN B12 & FOLATE
Folate: 7.8 ng/mL (ref >3.0)
Vitamin B-12: 520 pg/mL (ref 232–1245)

## 2023-07-08 LAB — VITAMIN A: Vitamin A: 31.9 ug/dL (ref 20.1–62.0)

## 2023-07-08 LAB — VITAMIN E
Vitamin E Alpha: 8.5 mg/L (ref 7.0–25.1)
Vitamin E Gamma: 1.1 mg/L (ref 0.5–5.5)

## 2023-09-20 MED ORDER — VITAMIN D (ERGOCALCIFEROL) 1.25 MG (50000 UT) PO CAPS
1.25 | ORAL_CAPSULE | ORAL | 0 refills | Status: DC
Start: 2023-09-20 — End: 2023-12-21

## 2023-12-21 MED ORDER — VITAMIN D (ERGOCALCIFEROL) 1.25 MG (50000 UT) PO CAPS
1.25 | ORAL_CAPSULE | ORAL | 0 refills | Status: AC
Start: 2023-12-21 — End: ?

## 2024-05-15 NOTE — Telephone Encounter (Signed)
 LM for pt to call and schedule annual bariatric exam. Letter sent. MyChart message also sent. [facility]
